# Patient Record
Sex: Male | Born: 1970
Health system: Southern US, Community
[De-identification: ages and names within clinical notes are randomized; demographics above are authoritative.]

## PROBLEM LIST (undated history)

## (undated) DIAGNOSIS — J309 Allergic rhinitis, unspecified: Secondary | ICD-10-CM

## (undated) DIAGNOSIS — E119 Type 2 diabetes mellitus without complications: Secondary | ICD-10-CM

## (undated) DIAGNOSIS — E785 Hyperlipidemia, unspecified: Secondary | ICD-10-CM

## (undated) DIAGNOSIS — J45909 Unspecified asthma, uncomplicated: Secondary | ICD-10-CM

## (undated) DIAGNOSIS — G4730 Sleep apnea, unspecified: Secondary | ICD-10-CM

## (undated) DIAGNOSIS — K760 Fatty (change of) liver, not elsewhere classified: Secondary | ICD-10-CM

## (undated) DIAGNOSIS — E669 Obesity, unspecified: Secondary | ICD-10-CM

## (undated) DIAGNOSIS — T7840XA Allergy, unspecified, initial encounter: Secondary | ICD-10-CM

## (undated) DIAGNOSIS — K5900 Constipation, unspecified: Secondary | ICD-10-CM

## (undated) DIAGNOSIS — K589 Irritable bowel syndrome without diarrhea: Secondary | ICD-10-CM

## (undated) DIAGNOSIS — J302 Other seasonal allergic rhinitis: Secondary | ICD-10-CM

## (undated) DIAGNOSIS — K3184 Gastroparesis: Secondary | ICD-10-CM

## (undated) DIAGNOSIS — G4733 Obstructive sleep apnea (adult) (pediatric): Secondary | ICD-10-CM

## (undated) DIAGNOSIS — K219 Gastro-esophageal reflux disease without esophagitis: Secondary | ICD-10-CM

## (undated) HISTORY — DX: Allergic rhinitis, unspecified: J30.9

## (undated) HISTORY — DX: Type 2 diabetes mellitus without complications: E11.9

## (undated) HISTORY — DX: Obesity, unspecified: E66.9

## (undated) HISTORY — PX: UPPER GASTROINTESTINAL ENDOSCOPY: SHX188

## (undated) HISTORY — DX: Hyperlipidemia, unspecified: E78.5

## (undated) HISTORY — DX: Sleep apnea, unspecified: G47.30

## (undated) HISTORY — DX: Gastro-esophageal reflux disease without esophagitis: K21.9

## (undated) HISTORY — PX: COLONOSCOPY: SHX174

## (undated) HISTORY — DX: Other seasonal allergic rhinitis: J30.2

## (undated) HISTORY — DX: Allergy, unspecified, initial encounter: T78.40XA

## (undated) HISTORY — DX: Obstructive sleep apnea (adult) (pediatric): G47.33

## (undated) HISTORY — DX: Constipation, unspecified: K59.00

## (undated) HISTORY — DX: Irritable bowel syndrome without diarrhea: K58.9

## (undated) HISTORY — DX: Gastroparesis: K31.84

## (undated) HISTORY — DX: Unspecified asthma, uncomplicated: J45.909

---

## 2012-07-05 ENCOUNTER — Encounter: Payer: Self-pay | Admitting: Internal Medicine

## 2012-07-05 ENCOUNTER — Ambulatory Visit (INDEPENDENT_AMBULATORY_CARE_PROVIDER_SITE_OTHER): Payer: BC Managed Care – PPO | Admitting: Internal Medicine

## 2012-07-05 VITALS — BP 110/78 | HR 84 | Temp 98.0°F | Ht 72.0 in | Wt 247.0 lb

## 2012-07-05 DIAGNOSIS — J309 Allergic rhinitis, unspecified: Secondary | ICD-10-CM

## 2012-07-05 DIAGNOSIS — E785 Hyperlipidemia, unspecified: Secondary | ICD-10-CM | POA: Insufficient documentation

## 2012-07-05 DIAGNOSIS — K219 Gastro-esophageal reflux disease without esophagitis: Secondary | ICD-10-CM | POA: Insufficient documentation

## 2012-07-05 HISTORY — DX: Gastro-esophageal reflux disease without esophagitis: K21.9

## 2012-07-05 HISTORY — DX: Allergic rhinitis, unspecified: J30.9

## 2012-07-05 MED ORDER — ESOMEPRAZOLE MAGNESIUM 40 MG PO CPDR: 40.0000 mg | DELAYED_RELEASE_CAPSULE | Freq: Every day | ORAL | Status: DC

## 2012-07-05 NOTE — Patient Instructions (Addendum)
Please sign a release of information and get records from: --GI, Dr. Eliberto Ivory -- your  previous doctor in [facility] --- Next visit in 4- 6 months for a complete physical, fasting

## 2012-07-05 NOTE — Assessment & Plan Note (Signed)
on Nasonex, symptoms not 100% control, recommend to take Claritin over-the-counter as needed.

## 2012-07-05 NOTE — Progress Notes (Signed)
  Subjective:    Patient ID: Wayne Melton, male    DOB: 09-13-1971, 41 y.o.   MRN: [id number]  HPI New patient Moved to [place] area from [place] few months ago. History of GERD, see assessment and plan, symptoms well-controlled History of high cholesterol, see assessment and plan Allergies, on Nasonex, occasionally takes antihistaminics. Currently symptoms are not as well-controlled as he would like them to be.  Past medical history Hyperlipidemia GERD, HH Peptic ulcer disease Allergies  Past surgical history None  Social history Married, 3 children Occupation-- IT, bussiness tobacco-- quit ! 2011, smoked x 4 years  ETOH-- rarely   Family history Diabetes-- M at age mid 89s CAD-- GF Stroke-- GF Colon cancer-- GF early 40s Prostate cancer-- GF early 90s   Review of Systems Reports sporadic nausea and vomiting for many years, no blood in the stools. No fever or chills No cough.    Objective:   Physical Exam General -- alert, well-developed. No apparent distress.  HEENT -- not pale Lungs -- normal respiratory effort, no intercostal retractions, no accessory muscle use, and normal breath sounds.   Heart-- normal rate, regular rhythm, no murmur, and no gallop.   Abdomen--soft, non-tender, no distention, no masses, no HSM, no guarding, and no rigidity.   Extremities-- no pretibial edema bilaterally  Neurologic-- alert & oriented X3 and strength normal in all extremities. Psych-- Cognition and judgment appear intact. Alert and cooperative with normal attention span and concentration.  not anxious appearing and not depressed appearing.      Assessment & Plan:

## 2012-07-05 NOTE — Assessment & Plan Note (Addendum)
Reports a history of high cholesterol, recommend to get records. At some point he took Niaspan and Lipitor, had side effects from Niaspan. Info about diet provided

## 2012-07-05 NOTE — Assessment & Plan Note (Signed)
History of GERD, a peptic ulcer and a hiatal hernia. Symptoms currently well controlled with Nexium In the past he had to endoscopy, the last one was approximately 2 years ago, Dr. Eliberto Ivory in [place]. Plan: Refill Nexium Get GI records

## 2012-09-20 ENCOUNTER — Telehealth: Payer: Self-pay | Admitting: Internal Medicine

## 2012-09-20 NOTE — Telephone Encounter (Signed)
Patient Information:  Caller Name: [place]  Phone: [phone]  Patient: Wayne Melton, Wayne Melton  Gender: Male  DOB: 11/23/1970  Age: 41 Years  PCP: Willow Ora  Office Follow Up:  Does the office need to follow up with this patient?: No  Instructions For The Office: N/A  RN Note:  Needs a Tetanus update.   No appts.  avaiable until Friday.  Going to UC this afternoon.  Symptoms  Reason For Call & Symptoms: Has a sliver of a piece of glass in his left foot.  Reviewed Health History In EMR: Yes  Reviewed Medications In EMR: Yes  Reviewed Allergies In EMR: Yes  Reviewed Surgeries / Procedures: Yes  Date of Onset of Symptoms: 09/06/2012  Treatments Tried: soaked the foot  Treatments Tried Worked: No  Guideline(s) Used:  Leg Injury  Disposition Per Guideline:   See Today or Tomorrow in Office  Reason For Disposition Reached:   Wound and no tetanus booster in > 5 years (Or greater than 10 years for clean cuts)  Advice Given:  N/A

## 2012-09-20 NOTE — Telephone Encounter (Signed)
Going to a urgent care

## 2012-11-06 ENCOUNTER — Ambulatory Visit (INDEPENDENT_AMBULATORY_CARE_PROVIDER_SITE_OTHER): Payer: BC Managed Care – PPO | Admitting: Internal Medicine

## 2012-11-06 ENCOUNTER — Encounter: Payer: Self-pay | Admitting: Internal Medicine

## 2012-11-06 VITALS — BP 108/82 | HR 77 | Temp 98.2°F | Ht 72.5 in | Wt 249.0 lb

## 2012-11-06 DIAGNOSIS — K589 Irritable bowel syndrome without diarrhea: Secondary | ICD-10-CM

## 2012-11-06 DIAGNOSIS — R0683 Snoring: Secondary | ICD-10-CM

## 2012-11-06 DIAGNOSIS — Z0000 Encounter for general adult medical examination without abnormal findings: Secondary | ICD-10-CM

## 2012-11-06 HISTORY — DX: Irritable bowel syndrome, unspecified: K58.9

## 2012-11-06 LAB — CBC WITH DIFFERENTIAL/PLATELET
Basophils Absolute: 0 10*3/uL (ref 0.0–0.1)
Eosinophils Absolute: 0 10*3/uL (ref 0.0–0.7)
HCT: 44.5 % (ref 39.0–52.0)
Hemoglobin: 15.3 g/dL (ref 13.0–17.0)
Lymphocytes Relative: 27.3 % (ref 12.0–46.0)
Lymphs Abs: 2.2 10*3/uL (ref 0.7–4.0)
MCHC: 34.3 g/dL (ref 30.0–36.0)
MCV: 89.3 fl (ref 78.0–100.0)
Monocytes Absolute: 0.5 10*3/uL (ref 0.1–1.0)
Neutro Abs: 5.2 10*3/uL (ref 1.4–7.7)
RDW: 13.6 % (ref 11.5–14.6)

## 2012-11-06 LAB — LIPID PANEL
Cholesterol: 307 mg/dL — ABNORMAL HIGH (ref 0–200)
Total CHOL/HDL Ratio: 10
Triglycerides: 512 mg/dL — ABNORMAL HIGH (ref 0.0–149.0)

## 2012-11-06 LAB — COMPREHENSIVE METABOLIC PANEL
Albumin: 4.1 g/dL (ref 3.5–5.2)
Alkaline Phosphatase: 101 U/L (ref 39–117)
BUN: 12 mg/dL (ref 6–23)
GFR: 85.29 mL/min (ref >60.00)
Glucose, Bld: 85 mg/dL (ref 70–99)
Potassium: 3.7 mEq/L (ref 3.5–5.1)

## 2012-11-06 MED ORDER — MOMETASONE FUROATE 50 MCG/ACT NA SUSP: 2.0000 | Freq: Every day | NASAL | Status: DC

## 2012-11-06 MED ORDER — ESOMEPRAZOLE MAGNESIUM 40 MG PO CPDR: 40.0000 mg | DELAYED_RELEASE_CAPSULE | Freq: Every day | ORAL | Status: DC

## 2012-11-06 NOTE — Progress Notes (Signed)
  Subjective:    Patient ID: Wayne Melton, male    DOB: Jul 09, 1971, 42 y.o.   MRN: [id number]  HPI Complete physical exam, his main concern today is fatigue, sleep apnea?. See review of systems.  Past Medical History  Diagnosis Date  . Allergic rhinitis 07/05/2012  . GERD , HH, h/o PUD 07/05/2012  . Hyperlipidemia    Past Surgical History  Procedure Date  . No past surgeries     Social history Married, 3 children Occupation-- IT, bussiness tobacco-- quit ! 2011, smoked x 4 years   ETOH-- rarely   Diet-- Exercise--  Family history Diabetes-- M at age mid 33s CAD-- GF Stroke-- GF Colon polyps-- M early 72s Colon cancer-- GF early 31s, uncle mid 67s Prostate cancer-- GF early 69s  Review of Systems Has a long history of very loud snoring, he also reports feeling extremely fatigue in the afternoon, as well as falling sleep very easy. No serious incidents such as falling asleep driving or at work. Denies chest pain or shortness of breath. History of IBS in the past, frequent constipation and   occasional diarrhea. No nausea, vomiting or blood in the stools. GERD symptoms well controlled. Admits to a lot of stress  however no clinical anxiety or depression.     Objective:   Physical Exam General -- alert, well-developed, and well-nourished.  BMI 33. Neck --no thyromegaly  HEENT --  throat is slightly crowded, neck anatomically  short Lungs -- normal respiratory effort, no intercostal retractions, no accessory muscle use, and normal breath sounds.   Heart-- normal rate, regular rhythm, no murmur, and no gallop.   Abdomen--soft, non-tender, no distention, no masses, no HSM, no guarding, and no rigidity.   Extremities-- no pretibial edema bilaterally  Neurologic-- alert & oriented X3 and strength normal in all extremities. Psych-- Cognition and judgment appear intact. Alert and cooperative with normal attention span and concentration.  not anxious appearing and not  depressed appearing.       Assessment & Plan:

## 2012-11-06 NOTE — Assessment & Plan Note (Addendum)
Td ~ 2006 per pt  Had a flu shot Has a family history of colon polyps (mother, early 73s ) and colon cancer ( uncle, mid 17s) Never had a colonoscopy, history of IBS. Plan: Refer to GI next year for a colonoscopy. In the past he saw Dr. Eliberto Ivory at [facility] gastroenterology Diet and exercise discussed Labs STE Clinically, I think there is enough evidence of a sleep apnea, we will schedule apnea lynk. Cardiovascular  Increased risk with sleep apnea discussed Addendum. 100s of pages of old records were reviewed.  Tdap 11/18/2008. History of multiple URIs and sinusitis, pneumonia diagnosed in 2012. Dyslipidemia, triglycerides in 2010 where as high as 754, his cholesterol in 2000 and was as high as 300. He was prescribed Lipitor which helped. Relevant records will be scanned, rest will go back to the patient for safe keeping.

## 2012-11-13 ENCOUNTER — Telehealth: Payer: Self-pay | Admitting: *Deleted

## 2012-11-13 DIAGNOSIS — R0683 Snoring: Secondary | ICD-10-CM

## 2012-11-13 MED ORDER — FENOFIBRATE 160 MG PO TABS: 160.0000 mg | ORAL_TABLET | Freq: Every day | ORAL | Status: DC

## 2012-11-13 NOTE — Telephone Encounter (Signed)
Called pt & discussed lab results. After discussing results pt asked about a referral for a sleep study. OK to enter referral? Please advise.

## 2012-11-13 NOTE — Telephone Encounter (Signed)
Yes , my plan was to set up an apnea lynk.  Order entered, let pt know

## 2012-11-13 NOTE — Addendum Note (Signed)
Addended by: Edwena Felty T on: 11/13/2012 01:53 PM   Modules accepted: Orders

## 2012-11-13 NOTE — Telephone Encounter (Signed)
Left detailed msg on pt's vmail.  

## 2012-11-30 ENCOUNTER — Telehealth: Payer: Self-pay | Admitting: *Deleted

## 2012-11-30 NOTE — Telephone Encounter (Addendum)
Pt left VM that he was calling to check status of sleep study.Please advise   Left message to call office

## 2012-12-01 NOTE — Telephone Encounter (Signed)
Have the results of the pt's sleep study test come back yet?

## 2012-12-01 NOTE — Telephone Encounter (Signed)
Thank you :)

## 2012-12-01 NOTE — Telephone Encounter (Signed)
has not scheduled sleep study yet per pt this morning when he called. I transferred him to [facility] at Pulmonary

## 2012-12-03 DIAGNOSIS — G4733 Obstructive sleep apnea (adult) (pediatric): Secondary | ICD-10-CM

## 2012-12-04 ENCOUNTER — Encounter: Payer: Self-pay | Admitting: Internal Medicine

## 2012-12-11 ENCOUNTER — Encounter: Payer: Self-pay | Admitting: Internal Medicine

## 2012-12-13 ENCOUNTER — Telehealth: Payer: Self-pay | Admitting: Internal Medicine

## 2012-12-13 NOTE — Telephone Encounter (Signed)
Advise patient, screening for sleep apnea was negative. I recommend to work on his weight to decrease snoring, if fatigue persists, will consider a more detailed exam like a overnight sleep study in the hospital. Also, be sure he has an appointment in April to followup his cholesterol.

## 2012-12-13 NOTE — Telephone Encounter (Signed)
Left message to call office

## 2012-12-18 NOTE — Telephone Encounter (Signed)
Left message to call office

## 2012-12-19 ENCOUNTER — Encounter: Payer: Self-pay | Admitting: *Deleted

## 2012-12-19 NOTE — Telephone Encounter (Signed)
Please call patient back at [phone]. Patient's wife said it is okay to call her but that you will need to call his # to reach him.

## 2012-12-19 NOTE — Telephone Encounter (Signed)
lmovm for pt to return call, mailed letter.

## 2012-12-20 NOTE — Telephone Encounter (Signed)
Discussed with pt, scheduled 2 month follow-up 4.14.14.

## 2013-01-15 ENCOUNTER — Encounter: Payer: Self-pay | Admitting: Internal Medicine

## 2013-01-15 ENCOUNTER — Ambulatory Visit (INDEPENDENT_AMBULATORY_CARE_PROVIDER_SITE_OTHER): Payer: BC Managed Care – PPO | Admitting: Internal Medicine

## 2013-01-15 VITALS — BP 110/78 | HR 77 | Temp 98.2°F | Wt 252.0 lb

## 2013-01-15 DIAGNOSIS — E785 Hyperlipidemia, unspecified: Secondary | ICD-10-CM

## 2013-01-15 DIAGNOSIS — J309 Allergic rhinitis, unspecified: Secondary | ICD-10-CM

## 2013-01-15 DIAGNOSIS — Z0000 Encounter for general adult medical examination without abnormal findings: Secondary | ICD-10-CM

## 2013-01-15 LAB — ALT: ALT: 34 U/L (ref 0–53)

## 2013-01-15 LAB — LIPID PANEL
Cholesterol: 249 mg/dL — ABNORMAL HIGH (ref 0–200)
Triglycerides: 215 mg/dL — ABNORMAL HIGH (ref 0.0–149.0)

## 2013-01-15 LAB — AST: AST: 15 U/L (ref 0–37)

## 2013-01-15 MED ORDER — NEDOCROMIL SODIUM 2 % OP SOLN: 2.0000 [drp] | Freq: Two times a day (BID) | OPHTHALMIC | Status: DC | PRN

## 2013-01-15 NOTE — Progress Notes (Signed)
  Subjective:    Patient ID: Wayne Melton, male    DOB: 02/06/1971, 42 y.o.   MRN: [id number]  HPI Follow up Dyslipidemia-- on fenofibrate, no s/e Allergies, sx increased lately, on flonase, still has eye sx   Past Medical History  Diagnosis Date  . Allergic rhinitis 07/05/2012  . GERD , HH, h/o PUD 07/05/2012  . Hyperlipidemia   . IBS (irritable bowel syndrome) 11/06/2012   Past Surgical History  Procedure Laterality Date  . No past surgeries       Review of Systems No N-V-D- abdominal pain Diet-exercise: about the same     Objective:   Physical Exam General -- alert, well-developed, No distress  HEENT -- TMs normal, throat w/o redness, face symmetric and not tender to palpation, Nose with mild congestion Neurologic-- alert & oriented X3 and strength normal in all extremities. Psych-- Cognition and judgment appear intact. Alert and cooperative with normal attention span and concentration.  not anxious appearing and not depressed appearing.       Assessment & Plan:

## 2013-01-15 NOTE — Assessment & Plan Note (Addendum)
See previous entry, home sleep study was negative. Results discussed with the patient.  He snores and has mild fatigue, plan is to work on diet, exercise and if ? of sleep apnea continue, will refer to pulmonary for  consideration of a overnight study.

## 2013-01-15 NOTE — Assessment & Plan Note (Addendum)
Nasal allergies well controlled with Nasonex, he still has eye symptoms. Recommend Alocril. Also OTC Claritin as needed

## 2013-01-15 NOTE — Assessment & Plan Note (Addendum)
Previous results discussed with patient, started fenofibrate 2 months ago, no side effects. Encourage a healthy diet and more exercise. Will check labs

## 2013-01-16 ENCOUNTER — Encounter: Payer: Self-pay | Admitting: Internal Medicine

## 2013-01-16 ENCOUNTER — Encounter: Payer: Self-pay | Admitting: *Deleted

## 2013-01-22 ENCOUNTER — Telehealth: Payer: Self-pay | Admitting: *Deleted

## 2013-01-22 ENCOUNTER — Encounter: Payer: Self-pay | Admitting: *Deleted

## 2013-01-22 MED ORDER — AZELASTINE HCL 0.05 % OP SOLN: 1.0000 [drp] | Freq: Two times a day (BID) | OPHTHALMIC | Status: DC

## 2013-01-22 NOTE — Telephone Encounter (Signed)
Refill done.  

## 2013-04-30 ENCOUNTER — Other Ambulatory Visit: Payer: Self-pay | Admitting: Internal Medicine

## 2013-04-30 ENCOUNTER — Encounter: Payer: Self-pay | Admitting: Internal Medicine

## 2013-04-30 ENCOUNTER — Ambulatory Visit (INDEPENDENT_AMBULATORY_CARE_PROVIDER_SITE_OTHER): Payer: BC Managed Care – PPO | Admitting: Internal Medicine

## 2013-04-30 VITALS — BP 120/82 | HR 63 | Temp 98.1°F | Wt 255.2 lb

## 2013-04-30 DIAGNOSIS — J309 Allergic rhinitis, unspecified: Secondary | ICD-10-CM

## 2013-04-30 MED ORDER — AMOXICILLIN 500 MG PO CAPS: 1000.0000 mg | ORAL_CAPSULE | Freq: Two times a day (BID) | ORAL | Status: DC

## 2013-04-30 MED ORDER — BECLOMETHASONE DIPROPIONATE 80 MCG/ACT NA AERS: 2.0000 | INHALATION_SPRAY | Freq: Every day | NASAL | Status: DC

## 2013-04-30 NOTE — Assessment & Plan Note (Signed)
Presents with upper respiratory symptoms for few weeks, allergies versus low-grade sinusitis?. Will get him back on nasal steroids, he felt like nasonex  wasn't working as well for him, thus will try qnsal and add zyrtec See instructions

## 2013-04-30 NOTE — Patient Instructions (Addendum)
Rest, fluids   take Mucinex twice a day for nasal congestion Qnsal daily Zyrtec daily (OTC)  Take the antibiotic as prescribed  (Amoxicillin) if no better in 1 week Call anytime if the symptoms are severe,or not back to normal in 2 - 3 weeks

## 2013-04-30 NOTE — Telephone Encounter (Signed)
Refill done per protocol.  

## 2013-04-30 NOTE — Progress Notes (Signed)
  Subjective:    Patient ID: Wayne Melton, male    DOB: 1971/08/15, 42 y.o.   MRN: [id number]  HPI Acute visit 4 weeks history of sinus congestion, postnasal dripping; Also for 2 or 3 weeks h/o  ear pressure. He discontinue the nasal spray about 3 weeks ago because he felt the allergy season was over.  Past Medical History  Diagnosis Date  . Allergic rhinitis 07/05/2012  . GERD , HH, h/o PUD 07/05/2012  . Hyperlipidemia   . IBS (irritable bowel syndrome) 11/06/2012   Past Surgical History  Procedure Laterality Date  . No past surgeries       Review of Systems No fever chills. No cough Not itchy eyes and nose but some sneezing. Mild muscles aches for 4 weeks and also some sweats at night     Objective:   Physical Exam General -- alert, well-developed, NAD   HEENT -- TMs  Bulge but no red R>L, no d/c; throat w/o redness, face symmetric and not tender to palpation at maxilary sinuses. Nose w/ mild congestion Lungs -- normal respiratory effort, no intercostal retractions, no accessory muscle use, and normal breath sounds.   Heart-- normal rate, regular rhythm, no murmur, and no gallop.    Psych-- Cognition and judgment appear intact. Alert and cooperative with normal attention span and concentration.  not anxious appearing and not depressed appearing.       Assessment & Plan:

## 2013-05-07 ENCOUNTER — Ambulatory Visit: Payer: BC Managed Care – PPO | Admitting: Internal Medicine

## 2013-05-07 DIAGNOSIS — Z0289 Encounter for other administrative examinations: Secondary | ICD-10-CM

## 2013-12-04 ENCOUNTER — Other Ambulatory Visit: Payer: Self-pay | Admitting: Internal Medicine

## 2013-12-07 ENCOUNTER — Encounter: Payer: Self-pay | Admitting: Physician Assistant

## 2013-12-07 ENCOUNTER — Ambulatory Visit (INDEPENDENT_AMBULATORY_CARE_PROVIDER_SITE_OTHER): Payer: BC Managed Care – PPO | Admitting: Physician Assistant

## 2013-12-07 VITALS — BP 117/82 | HR 89 | Temp 98.4°F | Wt 266.0 lb

## 2013-12-07 DIAGNOSIS — J209 Acute bronchitis, unspecified: Secondary | ICD-10-CM | POA: Insufficient documentation

## 2013-12-07 MED ORDER — AZITHROMYCIN 250 MG PO TABS: ORAL_TABLET | ORAL | Status: DC

## 2013-12-07 NOTE — Patient Instructions (Signed)
Take antibiotic as prescribed-- Azithromycin.  Increase fluid intake.  Rest.  Saline nasal spray. Use Mucinex-D as needed. Tylenol if fever returns. Place a Humidifier in bedroom. Continue allergy medications.  Please call or return to clinic if symptoms are not improving.  Bronchitis Bronchitis is inflammation of the airways that extend from the windpipe into the lungs (bronchi). The inflammation often causes mucus to develop, which leads to a cough. If the inflammation becomes severe, it may cause shortness of breath. CAUSES  Bronchitis may be caused by:   Viral infections.   Bacteria.   Cigarette smoke.   Allergens, pollutants, and other irritants.  SIGNS AND SYMPTOMS  The most common symptom of bronchitis is a frequent cough that produces mucus. Other symptoms include:  Fever.   Body aches.   Chest congestion.   Chills.   Shortness of breath.   Sore throat.  DIAGNOSIS  Bronchitis is usually diagnosed through a medical history and physical exam. Tests, such as chest X-rays, are sometimes done to rule out other conditions.  TREATMENT  You may need to avoid contact with whatever caused the problem (smoking, for example). Medicines are sometimes needed. These may include:  Antibiotics. These may be prescribed if the condition is caused by bacteria.  Cough suppressants. These may be prescribed for relief of cough symptoms.   Inhaled medicines. These may be prescribed to help open your airways and make it easier for you to breathe.   Steroid medicines. These may be prescribed for those with recurrent (chronic) bronchitis. HOME CARE INSTRUCTIONS  Get plenty of rest.   Drink enough fluids to keep your urine clear or pale yellow (unless you have a medical condition that requires fluid restriction). Increasing fluids may help thin your secretions and will prevent dehydration.   Only take over-the-counter or prescription medicines as directed by your health care  provider.  Only take antibiotics as directed. Make sure you finish them even if you start to feel better.  Avoid secondhand smoke, irritating chemicals, and strong fumes. These will make bronchitis worse. If you are a smoker, quit smoking. Consider using nicotine gum or skin patches to help control withdrawal symptoms. Quitting smoking will help your lungs heal faster.   Put a cool-mist humidifier in your bedroom at night to moisten the air. This may help loosen mucus. Change the water in the humidifier daily. You can also run the hot water in your shower and sit in the bathroom with the door closed for 5 10 minutes.   Follow up with your health care provider as directed.   Wash your hands frequently to avoid catching bronchitis again or spreading an infection to others.  SEEK MEDICAL CARE IF: Your symptoms do not improve after 1 week of treatment.  SEEK IMMEDIATE MEDICAL CARE IF:  Your fever increases.  You have chills.   You have chest pain.   You have worsening shortness of breath.   You have bloody sputum.  You faint.  You have lightheadedness.  You have a severe headache.   You vomit repeatedly. MAKE SURE YOU:   Understand these instructions.  Will watch your condition.  Will get help right away if you are not doing well or get worse. Document Released: 09/20/2005 Document Revised: 07/11/2013 Document Reviewed: 05/15/2013 [facility] Patient Information 2014 [place].

## 2013-12-07 NOTE — Progress Notes (Signed)
Patient presents to clinic today c/o 1.5 weeks of productive cough, chest congestion and fever.  Patient denies history of asthma.  Does have significant allergies for which he takes prescription medication.  Denies recent travel. Endorses chest tenderness 2/2 coughing.  Denies pleuritic chest pain.  Denies excess SOB.  Had some wheezing initially that has resolved.  Patient has taken Mucinex with some relief of symptoms.  Denies sinus pressure or pain currently.   Past Medical History  Diagnosis Date  . Allergic rhinitis 07/05/2012  . GERD , HH, h/o PUD 07/05/2012  . Hyperlipidemia   . IBS (irritable bowel syndrome) 11/06/2012   Current Outpatient Prescriptions on File Prior to Visit  Medication Sig Dispense Refill  . azelastine (OPTIVAR) 0.05 % ophthalmic solution Place 1 drop into both eyes 2 (two) times daily.  6 mL  6  . Beclomethasone Dipropionate (QNASL) 80 MCG/ACT AERS Place 2 sprays into the nose daily.  1 Inhaler  6  . fenofibrate 160 MG tablet TAKE 1 TABLET BY MOUTH EVERY DAY  30 tablet  5  . NEXIUM 40 MG capsule TAKE 1 CAPSULE BEFORE BREAKFAST EVERY MORNING  90 capsule  1   No current facility-administered medications on file prior to visit.    No Known Allergies  No family history on file.  History   Social History  . Marital Status: Married    Spouse Name: N/A    Number of Children: N/A  . Years of Education: N/A   Social History Main Topics  . Smoking status: Not on file  . Smokeless tobacco: Not on file  . Alcohol Use: Not on file  . Drug Use: Not on file  . Sexual Activity: Not on file   Other Topics Concern  . Not on file   Social History Narrative  . No narrative on file   Review of Systems - See HPI.  All other ROS are negative.  BP 117/82  Pulse 89  Temp(Src) 98.4 F (36.9 C) (Oral)  Wt 266 lb (120.657 kg)  SpO2 98%  Physical Exam  Vitals reviewed. Constitutional: He is oriented to person, place, and time and well-developed, well-nourished, and  in no distress.  HENT:  Head: Normocephalic and atraumatic.  Right Ear: External ear normal.  Left Ear: External ear normal.  Nose: Nose normal.  Mouth/Throat: Oropharynx is clear and moist. No oropharyngeal exudate.  TM within normal limits bilaterally.  No TTP of sinuses.  Eyes: Conjunctivae are normal. Pupils are equal, round, and reactive to light.  Neck: Neck supple.  Cardiovascular: Normal rate, regular rhythm, normal heart sounds and intact distal pulses.   Pulmonary/Chest: Effort normal and breath sounds normal. No respiratory distress. He has no wheezes. He has no rales. He exhibits no tenderness.  Lymphadenopathy:    He has no cervical adenopathy.  Neurological: He is alert and oriented to person, place, and time.  Skin: Skin is warm and dry. No rash noted.  Psychiatric: Affect normal.    Assessment/Plan: Acute bronchitis Rx Azithromcyin. Increase fluid intake.  Rest.  Saline nasal spray. Mucinex. Humidifier in bedroom. Continue allergy medications as prescribed.  Call or return to clinic if symptoms are not improving.

## 2013-12-07 NOTE — Assessment & Plan Note (Signed)
Rx Azithromcyin. Increase fluid intake.  Rest.  Saline nasal spray. Mucinex. Humidifier in bedroom. Continue allergy medications as prescribed.  Call or return to clinic if symptoms are not improving.

## 2013-12-07 NOTE — Progress Notes (Signed)
Pre-visit discussion using our clinic review tool. No additional management support is needed unless otherwise documented below in the visit note.  

## 2013-12-10 ENCOUNTER — Telehealth: Payer: Self-pay | Admitting: *Deleted

## 2013-12-10 DIAGNOSIS — K219 Gastro-esophageal reflux disease without esophagitis: Secondary | ICD-10-CM

## 2013-12-10 MED ORDER — ESOMEPRAZOLE MAGNESIUM 40 MG PO PACK: 40.0000 mg | PACK | Freq: Every day | ORAL | Status: DC

## 2013-12-10 NOTE — Telephone Encounter (Signed)
rx refilled per protocol  

## 2014-01-03 ENCOUNTER — Other Ambulatory Visit: Payer: Self-pay | Admitting: Internal Medicine

## 2014-02-12 ENCOUNTER — Other Ambulatory Visit: Payer: Self-pay | Admitting: Internal Medicine

## 2014-11-02 ENCOUNTER — Encounter (HOSPITAL_COMMUNITY): Payer: Self-pay | Admitting: Emergency Medicine

## 2014-11-02 ENCOUNTER — Emergency Department (HOSPITAL_COMMUNITY)
Admission: EM | Admit: 2014-11-02 | Discharge: 2014-11-03 | Disposition: A | Discharge status: Home / Self Care | Payer: BLUE CROSS/BLUE SHIELD | Attending: Emergency Medicine | Admitting: Emergency Medicine

## 2014-11-02 DIAGNOSIS — Z8711 Personal history of peptic ulcer disease: Secondary | ICD-10-CM | POA: Diagnosis not present

## 2014-11-02 DIAGNOSIS — Z79899 Other long term (current) drug therapy: Secondary | ICD-10-CM | POA: Insufficient documentation

## 2014-11-02 DIAGNOSIS — K219 Gastro-esophageal reflux disease without esophagitis: Secondary | ICD-10-CM | POA: Diagnosis not present

## 2014-11-02 DIAGNOSIS — R079 Chest pain, unspecified: Secondary | ICD-10-CM

## 2014-11-02 DIAGNOSIS — Z8709 Personal history of other diseases of the respiratory system: Secondary | ICD-10-CM | POA: Insufficient documentation

## 2014-11-02 DIAGNOSIS — F419 Anxiety disorder, unspecified: Secondary | ICD-10-CM | POA: Insufficient documentation

## 2014-11-02 DIAGNOSIS — E785 Hyperlipidemia, unspecified: Secondary | ICD-10-CM | POA: Insufficient documentation

## 2014-11-02 NOTE — ED Provider Notes (Signed)
CSN: [id number]     Arrival date & time 11/02/14  2337 History  This chart was scribed for Julianne Rice, MD by Molli Posey, ED Scribe. This patient was seen in room B14C/B14C and the patient's care was started 11:47 PM.   Chief Complaint  Patient presents with  . Chest Pain   The history is provided by the patient. No language interpreter was used.   HPI Comments: Wayne Melton is a 44 y.o. male with a history of GERD and hyperlipidemia who presents to the Emergency Department complaining of chest pain that started about an hour ago and woke him from his sleep. Pt states that the onset of his symptoms started with similar prior GERD flare ups and says he could barely talk and was gagging. He says his chest started to get tight, felt sightly disoriented and could feel his HR increase. Pt states that he was given nitroglycerin en route and states his symptoms improved en route. Pt reports that he has felt light headed and tired this week. Pt states that he has been taking his Nexium regularly. He reports he ate mozzarella sticks around 7PM. Pt reports a history of smoking 0.25 ppd for 3-4 years and says he quit 2 years ago. He reports no recent extended travel or surgeries.   Past Medical History  Diagnosis Date  . Allergic rhinitis 07/05/2012  . GERD , HH, h/o PUD 07/05/2012  . Hyperlipidemia   . IBS (irritable bowel syndrome) 11/06/2012   Past Surgical History  Procedure Laterality Date  . No past surgeries     No family history on file. History  Substance Use Topics  . Smoking status: Never Smoker   . Smokeless tobacco: Not on file  . Alcohol Use: No    Review of Systems  Constitutional: Negative for fever and chills.  Respiratory: Positive for cough, choking and shortness of breath. Negative for wheezing.   Cardiovascular: Positive for chest pain and palpitations. Negative for leg swelling.  Gastrointestinal: Negative for nausea, vomiting, abdominal pain, diarrhea and  constipation.  Musculoskeletal: Negative for back pain, neck pain and neck stiffness.  Skin: Negative for rash and wound.  Neurological: Positive for dizziness and light-headedness. Negative for weakness, numbness and headaches.  Psychiatric/Behavioral: The patient is nervous/anxious.   All other systems reviewed and are negative.   Allergies  Review of patient's allergies indicates no known allergies.  Home Medications   Prior to Admission medications   Medication Sig Start Date End Date Taking? Authorizing Provider  acetaminophen (TYLENOL) 500 MG tablet Take 1,000 mg by mouth every 6 (six) hours as needed for moderate pain.   Yes Historical Provider, MD  NEXIUM 40 MG capsule TAKE 1 CAPSULE BEFORE BREAKFAST EVERY MORNING   Yes Colon Branch, MD  azelastine (OPTIVAR) 0.05 % ophthalmic solution Place 1 drop into both eyes 2 (two) times daily. Patient not taking: Reported on 11/03/2014 01/22/13   Colon Branch, MD  azithromycin (ZITHROMAX) 250 MG tablet Take 2 tablets on Day 1.  Then take 1 tablet daily. Patient not taking: Reported on 11/03/2014 12/07/13   Brunetta Jeans, PA-C  Beclomethasone Dipropionate (QNASL) 80 MCG/ACT AERS Place 2 sprays into the nose daily. Patient not taking: Reported on 11/03/2014 04/30/13   Colon Branch, MD  fenofibrate 160 MG tablet TAKE 1 TABLET BY MOUTH EVERY DAY Patient not taking: Reported on 11/03/2014    Colon Branch, MD   BP 112/69 mmHg  Pulse 85  Temp(Src) 98.1 F (  36.7 C) (Oral)  Resp 12  SpO2 98% Physical Exam  Constitutional: He is oriented to person, place, and time. He appears well-developed and well-nourished. No distress.  Anxious appearing  HENT:  Head: Normocephalic and atraumatic.  Mouth/Throat: Oropharynx is clear and moist. No oropharyngeal exudate.  Eyes: EOM are normal. Pupils are equal, round, and reactive to light.  Neck: Normal range of motion. Neck supple.  Cardiovascular: Normal rate and regular rhythm.  Exam reveals no gallop and no  friction rub.   No murmur heard. Pulmonary/Chest: Effort normal and breath sounds normal. No respiratory distress. He has no wheezes. He has no rales. He exhibits no tenderness.  Abdominal: Soft. Bowel sounds are normal. He exhibits no distension and no mass. There is no tenderness. There is no rebound and no guarding.  Musculoskeletal: Normal range of motion. He exhibits no edema or tenderness.  No lower extremity swelling or pain. Distal pulses intact.  Neurological: He is alert and oriented to person, place, and time.  5/5 motor in all extremities. Sensation is fully intact.  Skin: Skin is warm and dry. No rash noted. No erythema.  Psychiatric: His behavior is normal.  Nursing note and vitals reviewed.   ED Course  Procedures   DIAGNOSTIC STUDIES: Oxygen Saturation is 100% on RA, normal by my interpretation.    COORDINATION OF CARE: 11:54 PM Discussed treatment plan with pt at bedside and pt agreed to plan.   Labs Review Labs Reviewed  COMPREHENSIVE METABOLIC PANEL - Abnormal; Notable for the following:    Glucose, Bld 207 (*)    All other components within normal limits  CBC WITH DIFFERENTIAL/PLATELET  TROPONIN I  D-DIMER, QUANTITATIVE  I-STAT TROPOININ, ED    Imaging Review Dg Chest 2 View  11/03/2014   CLINICAL DATA:  Chest pain. GERD. Pt states that he was given nitroglycerin en route and states it immediately provided relief to his symptoms. Pt reports that he has felt light headed and tired this week. HTN currently. Pt states that he has been taking his Nexium regularly. He reports he ate mozzarella sticks around 7PM.  EXAM: CHEST  2 VIEW  COMPARISON:  None.  FINDINGS: The heart size and mediastinal contours are within normal limits. Both lungs are clear. The visualized skeletal structures are unremarkable.  IMPRESSION: No active cardiopulmonary disease.   Electronically Signed   By: Kathreen Devoid   On: 11/03/2014 00:28     EKG Interpretation   Date/Time:  Saturday  November 02 2014 23:44:08 EST Ventricular Rate:  106 PR Interval:  140 QRS Duration: 113 QT Interval:  354 QTC Calculation: 470 R Axis:   71 Text Interpretation:  Sinus tachycardia Incomplete right bundle branch  block Confirmed by Rindi Beechy  MD, Evalynne Locurto ([id number]) on 11/02/2014 11:57:43 PM      MDM   Final diagnoses:  Chest pain   I personally performed the services described in this documentation, which was scribed in my presence. The recorded information has been reviewed and is accurate.  Patient is resting comfortably. No further chest pain. Troponin 2 is normal. EKG without any findings for ischemia. Suspect patient likely had reflux and then developed anxiety. Chest pain is atypical. Heart score is 0. We'll discharge home. Patient is to follow-up with his gastroenterologist. He's been given return precautions and is voiced understanding.     Julianne Rice, MD 11/03/14 [phone]

## 2014-11-02 NOTE — ED Notes (Signed)
Per EMS, pt from home. Pt awoken from sleep q 1 hour ago with 4/10 non radiating central CP and palpitations with mild SOB. Pt given 1 Nitro with almost instant relief and 324 ASA PTA. Pt states CP is now 1/10. NAD noted. ST on montior 113 bpm. 100% RA. RR 18 149/94 BP

## 2014-11-02 NOTE — ED Notes (Signed)
MD Yelverton at bedside. 

## 2014-11-03 ENCOUNTER — Emergency Department (HOSPITAL_COMMUNITY): Payer: BLUE CROSS/BLUE SHIELD

## 2014-11-03 LAB — CBC WITH DIFFERENTIAL/PLATELET
Basophils Absolute: 0 10*3/uL (ref 0.0–0.1)
Basophils Relative: 0 % (ref 0–1)
EOS ABS: 0.1 10*3/uL (ref 0.0–0.7)
Eosinophils Relative: 1 % (ref 0–5)
HEMATOCRIT: 42.1 % (ref 39.0–52.0)
Hemoglobin: 14.4 g/dL (ref 13.0–17.0)
Lymphocytes Relative: 20 % (ref 12–46)
Lymphs Abs: 1.8 10*3/uL (ref 0.7–4.0)
MCH: 29.6 pg (ref 26.0–34.0)
MCHC: 34.2 g/dL (ref 30.0–36.0)
MCV: 86.4 fL (ref 78.0–100.0)
MONOS PCT: 7 % (ref 3–12)
Monocytes Absolute: 0.6 10*3/uL (ref 0.1–1.0)
NEUTROS ABS: 6.3 10*3/uL (ref 1.7–7.7)
Neutrophils Relative %: 72 % (ref 43–77)
Platelets: 323 10*3/uL (ref 150–400)
RBC: 4.87 MIL/uL (ref 4.22–5.81)
RDW: 12.9 % (ref 11.5–15.5)
WBC: 8.7 10*3/uL (ref 4.0–10.5)

## 2014-11-03 LAB — COMPREHENSIVE METABOLIC PANEL
ALBUMIN: 3.6 g/dL (ref 3.5–5.2)
ALT: 35 U/L (ref 0–53)
AST: 15 U/L (ref 0–37)
Alkaline Phosphatase: 105 U/L (ref 39–117)
Anion gap: 9 (ref 5–15)
BUN: 9 mg/dL (ref 6–23)
CO2: 25 mmol/L (ref 19–32)
Calcium: 9 mg/dL (ref 8.4–10.5)
Chloride: 102 mmol/L (ref 96–112)
Creatinine, Ser: 0.98 mg/dL (ref 0.50–1.35)
GFR calc non Af Amer: >90 mL/min (ref >90)
Glucose, Bld: 207 mg/dL — ABNORMAL HIGH (ref 70–99)
Potassium: 3.6 mmol/L (ref 3.5–5.1)
SODIUM: 136 mmol/L (ref 135–145)
Total Bilirubin: 0.7 mg/dL (ref 0.3–1.2)
Total Protein: 6.6 g/dL (ref 6.0–8.3)

## 2014-11-03 LAB — TROPONIN I: Troponin I: <0.03 ng/mL (ref <0.031)

## 2014-11-03 LAB — I-STAT TROPONIN, ED: Troponin i, poc: 0 ng/mL (ref 0.00–0.08)

## 2014-11-03 LAB — D-DIMER, QUANTITATIVE (NOT AT ARMC): D DIMER QUANT: 0.4 ug{FEU}/mL (ref 0.00–0.48)

## 2014-11-03 NOTE — Discharge Instructions (Signed)
Gastroesophageal Reflux Disease, Adult  Gastroesophageal reflux disease (GERD) happens when acid from your stomach flows up into the esophagus. When acid comes in contact with the esophagus, the acid causes soreness (inflammation) in the esophagus. Over time, GERD may create small holes (ulcers) in the lining of the esophagus.  CAUSES    Increased body weight. This puts pressure on the stomach, making acid rise from the stomach into the esophagus.   Smoking. This increases acid production in the stomach.   Drinking alcohol. This causes decreased pressure in the lower esophageal sphincter (valve or ring of muscle between the esophagus and stomach), allowing acid from the stomach into the esophagus.   Late evening meals and a full stomach. This increases pressure and acid production in the stomach.   A malformed lower esophageal sphincter.  Sometimes, no cause is found.  SYMPTOMS    Burning pain in the lower part of the mid-chest behind the breastbone and in the mid-stomach area. This may occur twice a week or more often.   Trouble swallowing.   Sore throat.   Dry cough.   Asthma-like symptoms including chest tightness, shortness of breath, or wheezing.  DIAGNOSIS   Your caregiver may be able to diagnose GERD based on your symptoms. In some cases, X-rays and other tests may be done to check for complications or to check the condition of your stomach and esophagus.  TREATMENT   Your caregiver may recommend over-the-counter or prescription medicines to help decrease acid production. Ask your caregiver before starting or adding any new medicines.   HOME CARE INSTRUCTIONS    Change the factors that you can control. Ask your caregiver for guidance concerning weight loss, quitting smoking, and alcohol consumption.   Avoid foods and drinks that make your symptoms worse, such as:   Caffeine or alcoholic drinks.   Chocolate.   Peppermint or mint flavorings.   Garlic and onions.   Spicy foods.   Citrus fruits,  such as oranges, lemons, or limes.   Tomato-based foods such as sauce, chili, salsa, and pizza.   Fried and fatty foods.   Avoid lying down for the 3 hours prior to your bedtime or prior to taking a nap.   Eat small, frequent meals instead of large meals.   Wear loose-fitting clothing. Do not wear anything tight around your waist that causes pressure on your stomach.   Raise the head of your bed 6 to 8 inches with wood blocks to help you sleep. Extra pillows will not help.   Only take over-the-counter or prescription medicines for pain, discomfort, or fever as directed by your caregiver.   Do not take aspirin, ibuprofen, or other nonsteroidal anti-inflammatory drugs (NSAIDs).  SEEK IMMEDIATE MEDICAL CARE IF:    You have pain in your arms, neck, jaw, teeth, or back.   Your pain increases or changes in intensity or duration.   You develop nausea, vomiting, or sweating (diaphoresis).   You develop shortness of breath, or you faint.   Your vomit is green, yellow, black, or looks like coffee grounds or blood.   Your stool is red, bloody, or black.  These symptoms could be signs of other problems, such as heart disease, gastric bleeding, or esophageal bleeding.  MAKE SURE YOU:    Understand these instructions.   Will watch your condition.   Will get help right away if you are not doing well or get worse.  Document Released: 06/30/2005 Document Revised: 12/13/2011 Document Reviewed: 04/09/2011  Exit[employer] Patient   Information 2015 [employer]. This information is not intended to replace advice given to you by your health care provider. Make sure you discuss any questions you have with your health care provider.  Chest Pain (Nonspecific)  It is often hard to give a specific diagnosis for the cause of chest pain. There is always a chance that your pain could be related to something serious, such as a heart attack or a blood clot in the lungs. You need to follow up with your health care provider for further  evaluation.  CAUSES    Heartburn.   Pneumonia or bronchitis.   Anxiety or stress.   Inflammation around your heart (pericarditis) or lung (pleuritis or pleurisy).   A blood clot in the lung.   A collapsed lung (pneumothorax). It can develop suddenly on its own (spontaneous pneumothorax) or from trauma to the chest.   Shingles infection (herpes zoster virus).  The chest wall is composed of bones, muscles, and cartilage. Any of these can be the source of the pain.   The bones can be bruised by injury.   The muscles or cartilage can be strained by coughing or overwork.   The cartilage can be affected by inflammation and become sore (costochondritis).  DIAGNOSIS   Lab tests or other studies may be needed to find the cause of your pain. Your health care provider may have you take a test called an ambulatory electrocardiogram (ECG). An ECG records your heartbeat patterns over a 24-hour period. You may also have other tests, such as:   Transthoracic echocardiogram (TTE). During echocardiography, sound waves are used to evaluate how blood flows through your heart.   Transesophageal echocardiogram (TEE).   Cardiac monitoring. This allows your health care provider to monitor your heart rate and rhythm in real time.   Holter monitor. This is a portable device that records your heartbeat and can help diagnose heart arrhythmias. It allows your health care provider to track your heart activity for several days, if needed.   Stress tests by exercise or by giving medicine that makes the heart beat faster.  TREATMENT    Treatment depends on what may be causing your chest pain. Treatment may include:   Acid blockers for heartburn.   Anti-inflammatory medicine.   Pain medicine for inflammatory conditions.   Antibiotics if an infection is present.   You may be advised to change lifestyle habits. This includes stopping smoking and avoiding alcohol, caffeine, and chocolate.   You may be advised to keep your head  raised (elevated) when sleeping. This reduces the chance of acid going backward from your stomach into your esophagus.  Most of the time, nonspecific chest pain will improve within 2-3 days with rest and mild pain medicine.   HOME CARE INSTRUCTIONS    If antibiotics were prescribed, take them as directed. Finish them even if you start to feel better.   For the next few days, avoid physical activities that bring on chest pain. Continue physical activities as directed.   Do not use any tobacco products, including cigarettes, chewing tobacco, or electronic cigarettes.   Avoid drinking alcohol.   Only take medicine as directed by your health care provider.   Follow your health care provider's suggestions for further testing if your chest pain does not go away.   Keep any follow-up appointments you made. If you do not go to an appointment, you could develop lasting (chronic) problems with pain. If there is any problem keeping an appointment, call   to reschedule.  SEEK MEDICAL CARE IF:    Your chest pain does not go away, even after treatment.   You have a rash with blisters on your chest.   You have a fever.  SEEK IMMEDIATE MEDICAL CARE IF:    You have increased chest pain or pain that spreads to your arm, neck, jaw, back, or abdomen.   You have shortness of breath.   You have an increasing cough, or you cough up blood.   You have severe back or abdominal pain.   You feel nauseous or vomit.   You have severe weakness.   You faint.   You have chills.  This is an emergency. Do not wait to see if the pain will go away. Get medical help at once. Call your local emergency services (911 in [place] Do not drive yourself to the hospital.  MAKE SURE YOU:    Understand these instructions.   Will watch your condition.   Will get help right away if you are not doing well or get worse.  Document Released: 06/30/2005 Document Revised: 09/25/2013 Document Reviewed: 04/25/2008  [employer] Patient Information 2015  [employer]. This information is not intended to replace advice given to you by your health care provider. Make sure you discuss any questions you have with your health care provider.

## 2014-11-07 ENCOUNTER — Encounter: Payer: Self-pay | Admitting: Internal Medicine

## 2014-11-07 ENCOUNTER — Ambulatory Visit (INDEPENDENT_AMBULATORY_CARE_PROVIDER_SITE_OTHER): Payer: BLUE CROSS/BLUE SHIELD | Admitting: Internal Medicine

## 2014-11-07 VITALS — BP 121/85 | HR 80 | Temp 98.1°F | Ht 73.0 in | Wt 264.5 lb

## 2014-11-07 DIAGNOSIS — R5383 Other fatigue: Secondary | ICD-10-CM

## 2014-11-07 DIAGNOSIS — K219 Gastro-esophageal reflux disease without esophagitis: Secondary | ICD-10-CM

## 2014-11-07 DIAGNOSIS — E119 Type 2 diabetes mellitus without complications: Secondary | ICD-10-CM

## 2014-11-07 DIAGNOSIS — R739 Hyperglycemia, unspecified: Secondary | ICD-10-CM

## 2014-11-07 DIAGNOSIS — R079 Chest pain, unspecified: Secondary | ICD-10-CM

## 2014-11-07 DIAGNOSIS — E785 Hyperlipidemia, unspecified: Secondary | ICD-10-CM

## 2014-11-07 MED ORDER — PANTOPRAZOLE SODIUM 40 MG PO TBEC: 40.0000 mg | DELAYED_RELEASE_TABLET | Freq: Two times a day (BID) | ORAL | Status: DC

## 2014-11-07 NOTE — Patient Instructions (Signed)
Get your blood work before you leave     Please come back to the office in 2 months  for a physical exam. Come back fasting     If you need more information about a healthy diet  visit  the American [employer] Association, it  is a great resource online at:  http://www.richard-flynn.net/

## 2014-11-07 NOTE — Progress Notes (Signed)
Pre visit review using our clinic review tool, if applicable. No additional management support is needed unless otherwise documented below in the visit note. 

## 2014-11-07 NOTE — Progress Notes (Signed)
Subjective:    Patient ID: Wayne Melton, male    DOB: Aug 07, 1971, 44 y.o.   MRN: [id number]  DOS:  11/07/2014 Type of visit - description : ER f/u Interval history: The patient went to the ER 11/03/2014, 1 hour prior to the arrival he was wake up by chest pain, the pain was described as a burning sensation "like GERD but more severe ", and somewhat like  tightness in the chest. He did have some nausea. No radiation. At the ER, troponins, d-dimer, labs and chest x-ray were unremarkable except for a blood sugar of 200. EKG showing incomplete RBBB. Reports that that night he had two sodas and he thinks that is what causes sugar to be high. Since the ER visit he has been more careful with his diet and has no further chest pain.  He continue feeling fatigue, occasionally dizzy and snoring.  Review of Systems  No fever chills No shortness of breath, occasionally has lower extremity edema and end of the day No nausea, vomiting, diarrhea or blood in the stools. Occasionally he feels bloated. Denies dysphasia or odynophagia No cough or wheezing.  Past Medical History  Diagnosis Date  . Allergic rhinitis 07/05/2012  . GERD , HH, h/o PUD 07/05/2012  . Hyperlipidemia   . IBS (irritable bowel syndrome) 11/06/2012    Past Surgical History  Procedure Laterality Date  . No past surgeries      History   Social History  . Marital Status: Married    Spouse Name: N/A    Number of Children: N/A  . Years of Education: N/A   Occupational History  . Not on file.   Social History Main Topics  . Smoking status: Never Smoker   . Smokeless tobacco: Not on file  . Alcohol Use: No  . Drug Use: Not on file  . Sexual Activity: Not on file   Other Topics Concern  . Not on file   Social History Narrative        Medication List       This list is accurate as of: 11/07/14 11:59 PM.  Always use your most recent med list.               acetaminophen 500 MG tablet  Commonly known as:   TYLENOL  Take 1,000 mg by mouth every 6 (six) hours as needed for moderate pain.     pantoprazole 40 MG tablet  Commonly known as:  PROTONIX  Take 1 tablet (40 mg total) by mouth 2 (two) times daily before a meal.           Objective:   Physical Exam  Constitutional: He is oriented to person, place, and time. He appears well-developed. No distress.  BMI noted  HENT:  Head: Normocephalic and atraumatic.  Cardiovascular:  RRR, no murmur, rub or gallop  Pulmonary/Chest: Effort normal. No respiratory distress.  CTA B  Abdominal: Soft. Bowel sounds are normal. He exhibits no distension and no mass. There is no tenderness. There is no rebound and no guarding.  Musculoskeletal: Normal range of motion. He exhibits no edema or tenderness.  Neurological: He is alert and oriented to person, place, and time. No cranial nerve deficit. He exhibits normal muscle tone. Coordination normal.  Speech normal, gait unassisted and normal for age, motor strength appropriate for age   Skin: Skin is warm and dry. No pallor.  No jaundice  Psychiatric: He has a normal mood and affect. His behavior is normal. Judgment  and thought content normal.  Vitals reviewed.        Assessment & Plan:   Chest pain, Single episode of chest pain quite suggestive of GERD. He also has cardiovascular risk factors such as obesity and high cholesterol. Will treat GERD aggressively, if chest pain resurface will need workup. ER if symptoms severe. Pt in agreement  Hyperglycemia, check A1c   Problem List Items Addressed This Visit      Other   Hyperlipemia - Primary   Relevant Orders   Lipid panel    Other Visit Diagnoses    Hyperglycemia        Relevant Orders    Hemoglobin A1c

## 2014-11-08 DIAGNOSIS — R5383 Other fatigue: Secondary | ICD-10-CM | POA: Insufficient documentation

## 2014-11-08 NOTE — Assessment & Plan Note (Signed)
Not taking fenofibrate, I told patient he is high risk for strokes and other problems. Will check FLP and treat base on results.

## 2014-11-08 NOTE — Assessment & Plan Note (Signed)
He continue snoring and feeling  fatigued,  has gained weight lately; apnea Lynk ~3- 2014 was negative for sleep apnea, nevertheless I'm recommending him to work on his weight.

## 2014-11-08 NOTE — Assessment & Plan Note (Signed)
On Nexium OTC. Recommend to change to pantoprazole bid

## 2014-11-11 ENCOUNTER — Other Ambulatory Visit (INDEPENDENT_AMBULATORY_CARE_PROVIDER_SITE_OTHER): Payer: BLUE CROSS/BLUE SHIELD

## 2014-11-11 DIAGNOSIS — E785 Hyperlipidemia, unspecified: Secondary | ICD-10-CM

## 2014-11-11 DIAGNOSIS — R739 Hyperglycemia, unspecified: Secondary | ICD-10-CM

## 2014-11-11 LAB — LIPID PANEL
CHOL/HDL RATIO: 9
CHOLESTEROL: 282 mg/dL — AB (ref 0–200)
HDL: 30.3 mg/dL — ABNORMAL LOW (ref >39.00)
NONHDL: 251.7
Triglycerides: 394 mg/dL — ABNORMAL HIGH (ref 0.0–149.0)
VLDL: 78.8 mg/dL — ABNORMAL HIGH (ref 0.0–40.0)

## 2014-11-11 LAB — LDL CHOLESTEROL, DIRECT: Direct LDL: 174 mg/dL

## 2014-11-11 LAB — HEMOGLOBIN A1C: Hgb A1c MFr Bld: 6.6 % — ABNORMAL HIGH (ref 4.6–6.5)

## 2014-11-13 MED ORDER — FENOFIBRATE 150 MG PO CAPS: 150.0000 mg | ORAL_CAPSULE | Freq: Every day | ORAL | Status: DC

## 2014-11-13 MED ORDER — ATORVASTATIN CALCIUM 20 MG PO TABS: 20.0000 mg | ORAL_TABLET | Freq: Every day | ORAL | Status: DC

## 2014-11-13 NOTE — Addendum Note (Signed)
Addended by: Wilfrid Lund on: 11/13/2014 12:48 PM   Modules accepted: Orders

## 2014-12-20 ENCOUNTER — Encounter: Payer: Self-pay | Admitting: Dietician

## 2014-12-20 ENCOUNTER — Encounter: Payer: BLUE CROSS/BLUE SHIELD | Attending: Internal Medicine | Admitting: Dietician

## 2014-12-20 VITALS — Ht 70.0 in | Wt 262.0 lb

## 2014-12-20 DIAGNOSIS — Z713 Dietary counseling and surveillance: Secondary | ICD-10-CM | POA: Diagnosis not present

## 2014-12-20 DIAGNOSIS — E119 Type 2 diabetes mellitus without complications: Secondary | ICD-10-CM | POA: Diagnosis not present

## 2014-12-20 NOTE — Progress Notes (Signed)
Diabetes Self-Management Education  Visit Type:    Appt. Start Time: 1100 Appt. End Time: 4585  12/20/2014  Mr. Wayne Melton, identified by name and date of birth, is a 44 y.o. male with a diagnosis of Diabetes: Type 2.  Other people present during visit:  Patient   ASSESSMENT  Height 5\' 10"  (1.778 m), weight 262 lb (118.842 kg). Body mass index is 37.59 kg/(m^2).  Patient hx includes type 2 diabetes diagnosed 1 month ago and hyperlipidemia.  CHOL:  282, HDL 30, LDL 174, Trig:  394 11/11/14.  Patient also has IBS (constipation), made worse by overeating or increased fat content.  Initial Visit Information:  Are you currently following a meal plan?: No   Are you taking your medications as prescribed?: Yes Are you checking your feet?: No   How often do you need to have someone help you when you read instructions, pamphlets, or other written materials from your doctor or pharmacy?: 2 - Rarely (small print) What is the last grade level you completed in school?: Masters degree  Psychosocial:     Patient Belief/Attitude about Diabetes: Motivated to manage diabetes Self-care barriers: None Self-management support: Doctor's office, Family Other persons present: Patient Patient Concerns: Nutrition/Meal planning, Weight Control Special Needs: None Preferred Learning Style: No preference indicated Learning Readiness: Ready  Complications:   Last HgB A1C per patient/outside source: 6.6 mg/dL (11/11/14) How often do you check your blood sugar?: 0 times/day (not testing) Have you had a dilated eye exam in the past 12 months?: No Have you had a dental exam in the past 12 months?: No  Diet Intake:  Breakfast: honey nut cheerios and milk weekdays, eggs and bacon or sausage biscuit on weekend Snack (morning): Spreads lunch throughout the day Lunch: sandwich, soup, or chef boy r dee, with chips Snack (afternoon): spreads lunch throughout the day Dinner: frozen vegetables (half the  plate), meat (reduced to 1 portion), start (1/4 plate), or hamburger or [place] (no later than 6 or 7pm because of GERD) Snack (evening): none Beverage(s): water, diet soda, 50/50 sweet/unsweetened tea, coffee with 3 tsp sugar and cream, juice,   Exercise:  Exercise: ADL's  Individualized Plan for Diabetes Self-Management Training:   Learning Objective:  Patient will have a greater understanding of diabetes self-management.  Patient education plan per assessed needs and concerns is to attend individual sessions for     Education Topics Reviewed with Patient Today: Education patient regarding hyperlipidemia as well with label reading tips included. Definition of diabetes, type 1 and 2, and the diagnosis of diabetes Role of diet in the treatment of diabetes and the relationship between the three main macronutrients and blood glucose level, Food label reading, portion sizes and measuring food., Carbohydrate counting, Reviewed blood glucose goals for pre and post meals and how to evaluate the patients' food intake on their blood glucose level., Information on hints to eating out and maintain blood glucose control. Role of exercise on diabetes management, blood pressure control and cardiac health.   Interpreting lab values - A1C, lipid, urine microalbumina., Yearly dilated eye exam (SBG meter provided.  Patient knowlegdeable of use.  He will call or make an appt for questions.)   Relationship between chronic complications and blood glucose control, Dental care, Retinopathy and reason for yearly dilated eye exams Role of stress on diabetes   Lifestyle issues that need to be addressed for better diabetes care  PATIENTS GOALS/Plan (Developed by the patient):  Nutrition: Follow meal plan discussed, General guidelines for  healthy choices and portions discussed Physical Activity: 30 minutes per day (increast to 1 hour per day as tolerated) Medications: Not Applicable Monitoring : Not Applicable  (patient choice to check occasionally) Health Coping: discuss diabetes with (comment) (employee RN at work or call or email myself)  Plan:   Patient Instructions  Plan:  Aim for 4 Carb Choices per meal (60 grams) +/- 1 either way  Aim for 0-1 Carbs per snack if hungry  Include protein in moderation with your meals and snacks Consider reading food labels for Total Carbohydrate and Fat Grams of foods Consider  increasing your activity level by walking or biking or what you enjoy for 30 minutes daily as tolerated, increase to 1 hour per day as able and tolerated. Rethink what you drink.  Patient plans to increase exercise to improve blood sugar and lipids.  Expected Outcomes:  Demonstrated limited interest in learning.  Expect minimal changes  Education material provided: Living Well with Diabetes, Food label handouts, Meal plan card and Snack sheet  If problems or questions, patient to contact team via:  Phone and Email  Future DSME appointment: PRN

## 2014-12-20 NOTE — Patient Instructions (Signed)
Plan:  Aim for 4 Carb Choices per meal (60 grams) +/- 1 either way  Aim for 0-1 Carbs per snack if hungry  Include protein in moderation with your meals and snacks Consider reading food labels for Total Carbohydrate and Fat Grams of foods Consider  increasing your activity level by walking or biking or what you enjoy for 30 minutes daily as tolerated, increase to 1 hour per day as able and tolerated. Rethink what you drink.

## 2015-01-21 ENCOUNTER — Telehealth: Payer: Self-pay | Admitting: Internal Medicine

## 2015-01-21 NOTE — Telephone Encounter (Signed)
Pre Visit letter sent  °

## 2015-02-10 ENCOUNTER — Telehealth: Payer: Self-pay | Admitting: *Deleted

## 2015-02-10 ENCOUNTER — Encounter: Payer: Self-pay | Admitting: *Deleted

## 2015-02-10 NOTE — Telephone Encounter (Signed)
Unable to reach patient at time of Pre-Visit Call.  Left message for patient to return call when available.    

## 2015-02-10 NOTE — Telephone Encounter (Signed)
Pre-Visit Call completed with patient and chart updated.   Pre-Visit Info documented in Specialty Comments under SnapShot.    

## 2015-02-10 NOTE — Addendum Note (Signed)
Addended by: Leticia Penna A on: 02/10/2015 04:20 PM   Modules accepted: Medications

## 2015-02-11 ENCOUNTER — Encounter: Payer: Self-pay | Admitting: Internal Medicine

## 2015-02-11 ENCOUNTER — Ambulatory Visit (INDEPENDENT_AMBULATORY_CARE_PROVIDER_SITE_OTHER): Payer: BLUE CROSS/BLUE SHIELD | Admitting: Internal Medicine

## 2015-02-11 VITALS — BP 118/62 | HR 83 | Temp 98.7°F | Ht 70.0 in | Wt 254.0 lb

## 2015-02-11 DIAGNOSIS — Z0000 Encounter for general adult medical examination without abnormal findings: Secondary | ICD-10-CM

## 2015-02-11 DIAGNOSIS — R0683 Snoring: Secondary | ICD-10-CM

## 2015-02-11 DIAGNOSIS — Z23 Encounter for immunization: Secondary | ICD-10-CM | POA: Diagnosis not present

## 2015-02-11 DIAGNOSIS — E119 Type 2 diabetes mellitus without complications: Secondary | ICD-10-CM

## 2015-02-11 LAB — COMPREHENSIVE METABOLIC PANEL
ALT: 41 U/L (ref 0–53)
AST: 15 U/L (ref 0–37)
Albumin: 4.2 g/dL (ref 3.5–5.2)
Alkaline Phosphatase: 78 U/L (ref 39–117)
BILIRUBIN TOTAL: 0.6 mg/dL (ref 0.2–1.2)
BUN: 13 mg/dL (ref 6–23)
CO2: 22 mEq/L (ref 19–32)
Calcium: 9.6 mg/dL (ref 8.4–10.5)
Chloride: 107 mEq/L (ref 96–112)
Creatinine, Ser: 1.11 mg/dL (ref 0.40–1.50)
GFR: 77.36 mL/min (ref >60.00)
GLUCOSE: 78 mg/dL (ref 70–99)
Potassium: 3.8 mEq/L (ref 3.5–5.1)
SODIUM: 139 meq/L (ref 135–145)
Total Protein: 7.4 g/dL (ref 6.0–8.3)

## 2015-02-11 LAB — TSH: TSH: 1.12 u[IU]/mL (ref 0.35–4.50)

## 2015-02-11 LAB — LIPID PANEL
CHOLESTEROL: 178 mg/dL (ref 0–200)
HDL: 33.6 mg/dL — ABNORMAL LOW (ref >39.00)
NonHDL: 144.4
Total CHOL/HDL Ratio: 5
Triglycerides: 233 mg/dL — ABNORMAL HIGH (ref 0.0–149.0)
VLDL: 46.6 mg/dL — ABNORMAL HIGH (ref 0.0–40.0)

## 2015-02-11 LAB — HEMOGLOBIN A1C: Hgb A1c MFr Bld: 6.2 % (ref 4.6–6.5)

## 2015-02-11 LAB — LDL CHOLESTEROL, DIRECT: Direct LDL: 113 mg/dL

## 2015-02-11 MED ORDER — ATORVASTATIN CALCIUM 20 MG PO TABS: 20.0000 mg | ORAL_TABLET | Freq: Every day | ORAL | Status: DC

## 2015-02-11 MED ORDER — PANTOPRAZOLE SODIUM 40 MG PO TBEC: 40.0000 mg | DELAYED_RELEASE_TABLET | Freq: Two times a day (BID) | ORAL | Status: DC

## 2015-02-11 MED ORDER — FENOFIBRATE 150 MG PO CAPS: 150.0000 mg | ORAL_CAPSULE | Freq: Every day | ORAL | Status: DC

## 2015-02-11 NOTE — Patient Instructions (Signed)
Get your blood work before you leave    Come back to the office in 6 months   for a routine check up      If you need more information about a healthy diet,   visit  the American [employer] Association, it  is a Financial planner at:  http://www.richard-flynn.net/  All about diabetes, great resource!  InsuranceTransaction.co.za.[email or web address]

## 2015-02-11 NOTE — Progress Notes (Signed)
Subjective:    Patient ID: Wayne Melton, male    DOB: [birth year], 44 y.o.   MRN: [id number]  DOS:  02/11/2015 Type of visit - description : cpx Interval history: In general doing well, see review of systems. Good compliance of medications. Still fatigue-snoring    Review of Systems Constitutional: No fever, chills. No unexplained wt changes. No unusual sweats HEENT: No dental problems, ear discharge, facial swelling, voice changes. No eye discharge, redness or intolerance to light Respiratory: No wheezing or difficulty breathing. No cough , mucus production Cardiovascular: No CP, leg swelling or palpitations GI: no nausea, vomiting, diarrhea or abdominal pain.  No blood in the stools. No dysphagia   Endocrine: No polyphagia, polyuria or polydipsia GU: No dysuria, gross hematuria, difficulty urinating. No urinary urgency or frequency. Musculoskeletal:  Reports on and off pain at the site of the hip, pain decrease with Tylenol and physical activity and is worse in the mornings. Denies any actual knuckles swelling or redness. Skin: No change in the color of the skin, palor or rash Allergic, immunologic: + Environmental allergies, better with Zyrtec. Neurological: No dizziness or syncope. No headaches. No diplopia, slurred speech, motor deficits, facial numbness Hematological: No enlarged lymph nodes, easy bruising or bleeding Psychiatry: No suicidal ideas, hallucinations, behavior problems or confusion. No unusual/severe anxiety or depression.     Past Medical History  Diagnosis Date  . Allergic rhinitis 07/05/2012  . GERD , HH, h/o PUD 07/05/2012  . Hyperlipidemia   . IBS (irritable bowel syndrome) 11/06/2012  . Diabetes mellitus without complication     Past Surgical History  Procedure Laterality Date  . No past surgeries      History   Social History  . Marital Status: Married    Spouse Name: N/A  . Number of Children: N/A  . Years of Education: N/A    Occupational History  . Not on file.   Social History Main Topics  . Smoking status: Never Smoker   . Smokeless tobacco: Not on file  . Alcohol Use: No  . Drug Use: Not on file  . Sexual Activity: Not on file   Other Topics Concern  . Not on file   Social History Narrative     Family History  Problem Relation Age of Onset  . Pancreatitis Mother   . Diabetes Mother   . Hyperlipidemia Mother   . CAD Other     GF in his 64s  . Prostate cancer Other     GF in his 79s  . Colon cancer Other     uncle in his 75s  . Colon polyps Mother     late 40s       Medication List       This list is accurate as of: 02/11/15  2:00 PM.  Always use your most recent med list.               acetaminophen 500 MG tablet  Commonly known as:  TYLENOL  Take 1,000 mg by mouth every 6 (six) hours as needed for moderate pain.     atorvastatin 20 MG tablet  Commonly known as:  LIPITOR  Take 1 tablet (20 mg total) by mouth daily.     Fenofibrate 150 MG Caps  Take 1 capsule (150 mg total) by mouth daily.     pantoprazole 40 MG tablet  Commonly known as:  PROTONIX  Take 1 tablet (40 mg total) by mouth 2 (two) times daily before a meal.  Objective:   Physical Exam BP 118/62 mmHg  Pulse 83  Temp(Src) 98.7 F (37.1 C) (Oral)  Ht 5\' 10"  (1.778 m)  Wt 254 lb (115.214 kg)  BMI 36.45 kg/m2  SpO2 98% General:   Well developed, well nourished . NAD.  Neck:  Full range of motion. Supple. No  thyromegaly , normal carotid pulse HEENT:  Normocephalic . Face symmetric, atraumatic Lungs:  CTA B Normal respiratory effort, no intercostal retractions, no accessory muscle use. Heart: RRR,  no murmur.  No pretibial edema bilaterally  Abdomen:  Not distended, soft, non-tender. No rebound or rigidity. No mass,organomegaly Skin: Exposed areas without rash. Not pale. Not jaundice MSK: No TTP at the trochanteric bursa, hands and wrists without synovitis on exam Neurologic:   alert & oriented X3.  Speech normal, gait appropriate for age and unassisted Strength symmetric and appropriate for age.  Psych: Cognition and judgment appear intact.  Cooperative with normal attention span and concentration.  Behavior appropriate. No anxious or depressed appearing.        Assessment & Plan:

## 2015-02-11 NOTE — Assessment & Plan Note (Addendum)
Td 2010 pnm shot -- today   Has a family history of colon polyps (mother, early 51s ) and colon cancer ( uncle, mid 22s) Never had a colonoscopy, history of IBS.  Plan: Refer to GI ([place])  Diet and exercise discussed Labs   Other issues: Dyslipidemia, on 2 medications, refill meds, check FLP Mild diabetes, dx discussed, recommend to work on diet and exercise, recheck A1c. Eye exam rec. Did talk w/ a nutritionist few weeks ago History of fatigue -snoring, home sleep apnea test  Neg 2014.plan--- refer to pulmonary: overnight study ? arthralgias -- observation for now

## 2015-02-11 NOTE — Progress Notes (Signed)
Pre visit review using our clinic review tool, if applicable. No additional management support is needed unless otherwise documented below in the visit note. 

## 2015-02-12 LAB — MICROALBUMIN / CREATININE URINE RATIO
CREATININE, U: 283.4 mg/dL
MICROALB UR: 1 mg/dL (ref 0.0–1.9)
Microalb Creat Ratio: 0.4 mg/g (ref 0.0–30.0)

## 2015-02-12 LAB — HIV ANTIBODY (ROUTINE TESTING W REFLEX): HIV 1&2 Ab, 4th Generation: NONREACTIVE

## 2015-02-25 ENCOUNTER — Telehealth: Payer: Self-pay | Admitting: *Deleted

## 2015-02-25 NOTE — Telephone Encounter (Signed)
thx

## 2015-02-25 NOTE — Telephone Encounter (Signed)
Patient called stating that he was having high blood pressure readings.  Last blood pressure was 157/97.  Per patient his blood pressure had been higher and during that time he felt a "tugging" in his chest.  He denies chest pain, shortness of breath, dizziness, headaches, or blurred vision.    Patient scheduled with Dr. Larose Kells for 9:30 02/26/15.  Notified patient that if he had shortness of breath, dizziness, or chest pain to go to ED.  Stated understanding and agreed.

## 2015-02-26 ENCOUNTER — Encounter: Payer: Self-pay | Admitting: Internal Medicine

## 2015-02-26 ENCOUNTER — Ambulatory Visit (INDEPENDENT_AMBULATORY_CARE_PROVIDER_SITE_OTHER): Payer: BLUE CROSS/BLUE SHIELD | Admitting: Internal Medicine

## 2015-02-26 VITALS — BP 162/78 | HR 81 | Temp 97.9°F | Ht 70.0 in | Wt 257.4 lb

## 2015-02-26 DIAGNOSIS — R072 Precordial pain: Secondary | ICD-10-CM | POA: Diagnosis not present

## 2015-02-26 DIAGNOSIS — R079 Chest pain, unspecified: Secondary | ICD-10-CM | POA: Insufficient documentation

## 2015-02-26 MED ORDER — CARVEDILOL 6.25 MG PO TABS: 6.2500 mg | ORAL_TABLET | Freq: Two times a day (BID) | ORAL | Status: DC

## 2015-02-26 MED ORDER — NITROGLYCERIN 0.4 MG SL SUBL: 0.4000 mg | SUBLINGUAL_TABLET | SUBLINGUAL | Status: DC | PRN

## 2015-02-26 NOTE — Progress Notes (Signed)
Pre visit review using our clinic review tool, if applicable. No additional management support is needed unless otherwise documented below in the visit note. 

## 2015-02-26 NOTE — Patient Instructions (Signed)
Start Aspirin 81 mg 1 tab a day If Chest pain: use a nitroglycerin tablet under the tongue every 5 minutes. If after the first dose the chest pain is not going away: CALL 911 If the pain is severe, you have feel sweaty, have nausea: Call 911

## 2015-02-26 NOTE — Assessment & Plan Note (Addendum)
44 year old gentleman with history of diabetes, high cholesterol on statins and fenofibrate, + family history of CAD (G-father age 74) , no hypertension but BP x 1 day presents with a single episode of chest pain. Had chest pain earlier this year but at the time symptoms were clearly associated with GERD, this time no heartburn. EKG today iRBBB, at baseline Recent BMP wnl Plan: Stress test Add  Asa ntg prn ER if sx resurface, see instructions  Add coreg , monitor BPs RTC 4 weeks

## 2015-02-26 NOTE — Progress Notes (Signed)
Subjective:    Patient ID: Wayne Melton, male    DOB: [birth year], 44 y.o.   MRN: [id number]  DOS:  02/26/2015 Type of visit - description : acute Interval history: Yesterday at around 3.45 she was sitting at his desk working and developed palpitations described as "heart beating stronger" along with a soreness at the anterior chest, at the end of the sternum, left > R. He got concerned, somebody or check his blood pressure and it was 160, 157/97. Symptoms lasted 10 minutes, was not related to any food intake. Not associated with nausea, vomiting, diaphoresis. Symptoms have not returned. On further questions, he is acting playing softball, he may get some short of breath when he runs but no chest pain .   Review of Systems Denies fever chills No actual acid reflux lately No cough or sputum production When asked about stress, he has been under a lot of stress in the last 2 months d/t  work  Past Medical History  Diagnosis Date  . Allergic rhinitis 07/05/2012  . GERD , HH, h/o PUD 07/05/2012  . Hyperlipidemia   . IBS (irritable bowel syndrome) 11/06/2012  . Diabetes mellitus without complication     Past Surgical History  Procedure Laterality Date  . Sleep apnea test negative  2014    History   Social History  . Marital Status: Married    Spouse Name: N/A  . Number of Children: 3  . Years of Education: N/A   Occupational History  . busines analyst, computers    Social History Main Topics  . Smoking status: Never Smoker   . Smokeless tobacco: Not on file  . Alcohol Use: 0.0 oz/week    0 Standard drinks or equivalent per week     Comment: rare  . Drug Use: Not on file  . Sexual Activity: Not on file   Other Topics Concern  . Not on file   Social History Narrative        Medication List       This list is accurate as of: 02/26/15 11:59 PM.  Always use your most recent med list.               acetaminophen 500 MG tablet  Commonly known as:  TYLENOL    Take 1,000 mg by mouth every 6 (six) hours as needed for moderate pain.     atorvastatin 20 MG tablet  Commonly known as:  LIPITOR  Take 1 tablet (20 mg total) by mouth daily.     carvedilol 6.25 MG tablet  Commonly known as:  COREG  Take 1 tablet (6.25 mg total) by mouth 2 (two) times daily with a meal.     Fenofibrate 150 MG Caps  Take 1 capsule (150 mg total) by mouth daily.     nitroGLYCERIN 0.4 MG SL tablet  Commonly known as:  NITROSTAT  Place 1 tablet (0.4 mg total) under the tongue every 5 (five) minutes as needed for chest pain.     pantoprazole 40 MG tablet  Commonly known as:  PROTONIX  Take 1 tablet (40 mg total) by mouth 2 (two) times daily before a meal.           Objective:   Physical Exam BP 162/78 mmHg  Pulse 81  Temp(Src) 97.9 F (36.6 C) (Oral)  Ht 5\' 10"  (1.778 m)  Wt 257 lb 6 oz (116.745 kg)  BMI 36.93 kg/m2  SpO2 99%  General:   Well developed, well nourished .  NAD.  HEENT:  Normocephalic . Face symmetric, atraumatic Lungs:  CTA B Normal respiratory effort, no intercostal retractions, no accessory muscle use. Heart: RRR,  no murmur.  no pretibial edema bilaterally  Chest wall no TTP Abdomen:  Not distended, soft, non-tender. No rebound or rigidity. No mass,organomegaly Skin: Not pale. Not jaundice Neurologic:  alert & oriented X3.  Speech normal, gait appropriate for age and unassisted Psych--  Cognition and judgment appear intact.  Cooperative with normal attention span and concentration.  Behavior appropriate. No anxious or depressed appearing.      Assessment & Plan:

## 2015-03-12 ENCOUNTER — Encounter: Payer: Self-pay | Admitting: Internal Medicine

## 2015-03-12 ENCOUNTER — Other Ambulatory Visit: Payer: Self-pay

## 2015-03-12 MED ORDER — BECLOMETHASONE DIPROPIONATE 80 MCG/ACT NA AERS: 2.0000 | INHALATION_SPRAY | Freq: Every day | NASAL | Status: DC

## 2015-03-14 ENCOUNTER — Telehealth: Payer: Self-pay | Admitting: *Deleted

## 2015-03-14 NOTE — Telephone Encounter (Signed)
Pt dropped off a physical form to be completed. Form completed as much as possible and forwarded to Dr. Larose Kells. JG/CMA

## 2015-03-18 ENCOUNTER — Encounter (HOSPITAL_COMMUNITY): Payer: BLUE CROSS/BLUE SHIELD

## 2015-03-20 NOTE — Telephone Encounter (Signed)
Called and informed pt via voicemail that forms are ready for pick up at our front desk. Copy sent for scanning. JG//CMA

## 2015-03-26 ENCOUNTER — Encounter: Payer: Self-pay | Admitting: Gastroenterology

## 2015-03-26 ENCOUNTER — Ambulatory Visit: Payer: BLUE CROSS/BLUE SHIELD | Admitting: Internal Medicine

## 2015-03-27 ENCOUNTER — Telehealth (HOSPITAL_COMMUNITY): Payer: Self-pay | Admitting: *Deleted

## 2015-03-27 NOTE — Telephone Encounter (Signed)
Left message on voicemail per DPR in reference to upcoming appointment scheduled on 04/01/15 at 7:15 with detailed instructions given per Myocardial Perfusion Study Information Sheet for the test. LM to arrive 15 minutes early, and that it is imperative to arrive on time for appointment to keep from having the test rescheduled.Phone number given for call back for any questions. Hubbard Robinson, RN

## 2015-03-28 ENCOUNTER — Telehealth (HOSPITAL_COMMUNITY): Payer: Self-pay

## 2015-03-28 ENCOUNTER — Ambulatory Visit (INDEPENDENT_AMBULATORY_CARE_PROVIDER_SITE_OTHER): Payer: BLUE CROSS/BLUE SHIELD | Admitting: Internal Medicine

## 2015-03-28 ENCOUNTER — Encounter: Payer: Self-pay | Admitting: Internal Medicine

## 2015-03-28 VITALS — BP 118/74 | HR 62 | Temp 97.8°F | Ht 70.0 in | Wt 255.0 lb

## 2015-03-28 DIAGNOSIS — R072 Precordial pain: Secondary | ICD-10-CM | POA: Diagnosis not present

## 2015-03-28 DIAGNOSIS — E119 Type 2 diabetes mellitus without complications: Secondary | ICD-10-CM | POA: Insufficient documentation

## 2015-03-28 LAB — HM DIABETES FOOT EXAM: HM DIABETIC FOOT EXAM: NORMAL

## 2015-03-28 NOTE — Assessment & Plan Note (Signed)
No further pain, stress test pending

## 2015-03-28 NOTE — Patient Instructions (Addendum)
Use antifungal cream OTC between your toes until better

## 2015-03-28 NOTE — Telephone Encounter (Signed)
Patient given detailed instructions per Myocardial Perfusion Study Information Sheet for test on 04-01-2015 at 7:15am. Patient Notified to arrive 15 minutes early, and that it is imperative to arrive on time for appointment to keep from having the test rescheduled. Patient verbalized understanding. Wayne Melton, [name] A

## 2015-03-28 NOTE — Assessment & Plan Note (Addendum)
Feet exam negative today except for a very small fungal infection.we'll treat with OTCs. Recommend to have an eye check. Mildly increased blood pressure since last visit, now on Coreg, no side effects, BP is normal

## 2015-03-28 NOTE — Progress Notes (Signed)
Pre visit review using our clinic review tool, if applicable. No additional management support is needed unless otherwise documented below in the visit note. 

## 2015-03-28 NOTE — Progress Notes (Signed)
Subjective:    Patient ID: Wayne Melton, male    DOB: [birth year], 44 y.o.   MRN: [id number]  DOS:  03/28/2015 Type of visit - description : Follow-up Interval history: Since the last office visit, he started carvedilol, ambulatory blood pressures are great, around 120. No further chest pain, stress test pending   Review of Systems  Denies palpitations, some difficulty breathing only a very humid days. Otherwise no problems. Denies fatigue No nausea, vomiting, diarrhea Past Medical History  Diagnosis Date  . Allergic rhinitis 07/05/2012  . GERD , HH, h/o PUD 07/05/2012  . Hyperlipidemia   . IBS (irritable bowel syndrome) 11/06/2012  . Diabetes mellitus without complication     Past Surgical History  Procedure Laterality Date  . Sleep apnea test negative  2014    History   Social History  . Marital Status: Married    Spouse Name: N/A  . Number of Children: 3  . Years of Education: N/A   Occupational History  . busines analyst, computers    Social History Main Topics  . Smoking status: Never Smoker   . Smokeless tobacco: Not on file  . Alcohol Use: 0.0 oz/week    0 Standard drinks or equivalent per week     Comment: rare  . Drug Use: Not on file  . Sexual Activity: Not on file   Other Topics Concern  . Not on file   Social History Narrative        Medication List       This list is accurate as of: 03/28/15 11:59 PM.  Always use your most recent med list.               acetaminophen 500 MG tablet  Commonly known as:  TYLENOL  Take 1,000 mg by mouth every 6 (six) hours as needed for moderate pain.     atorvastatin 20 MG tablet  Commonly known as:  LIPITOR  Take 1 tablet (20 mg total) by mouth daily.     Beclomethasone Dipropionate 80 MCG/ACT Aers  Place 2 sprays into the nose daily.     carvedilol 6.25 MG tablet  Commonly known as:  COREG  Take 1 tablet (6.25 mg total) by mouth 2 (two) times daily with a meal.     Fenofibrate 150 MG Caps    Take 1 capsule (150 mg total) by mouth daily.     nitroGLYCERIN 0.4 MG SL tablet  Commonly known as:  NITROSTAT  Place 1 tablet (0.4 mg total) under the tongue every 5 (five) minutes as needed for chest pain.     pantoprazole 40 MG tablet  Commonly known as:  PROTONIX  Take 1 tablet (40 mg total) by mouth 2 (two) times daily before a meal.           Objective:   Physical Exam BP 118/74 mmHg  Pulse 62  Temp(Src) 97.8 F (36.6 C) (Oral)  Ht 5\' 10"  (1.778 m)  Wt 255 lb (115.667 kg)  BMI 36.59 kg/m2  SpO2 97% General:   Well developed, well nourished . NAD.  HEENT:  Normocephalic . Face symmetric, atraumatic Lungs:  CTA B Normal respiratory effort, no intercostal retractions, no accessory muscle use. Heart: RRR,  no murmur.  No pretibial edema bilaterally  Diabetic foot exam: Pinprick examination normal, no edema, good pedal pulses. Mild redness-scaliness between the second and third toes bilaterally. Neurologic:  alert & oriented X3.  Speech normal, gait appropriate for age and unassisted Psych--  Cognition and judgment appear intact.  Cooperative with normal attention span and concentration.  Behavior appropriate. No anxious or depressed appearing.      Assessment & Plan:

## 2015-04-01 ENCOUNTER — Ambulatory Visit (HOSPITAL_COMMUNITY): Payer: BLUE CROSS/BLUE SHIELD | Attending: Internal Medicine

## 2015-04-01 DIAGNOSIS — R072 Precordial pain: Secondary | ICD-10-CM

## 2015-04-01 LAB — MYOCARDIAL PERFUSION IMAGING
Estimated workload: 12.4 METS
Exercise duration (min): 11 min
Exercise duration (sec): 15 s
LV dias vol: 103 mL
LV sys vol: 38 mL
MPHR: 166 {beats}/min
Peak HR: 166 {beats}/min
Percent HR: 93 %
RATE: 0.34
RPE: 17
Rest HR: 75 {beats}/min
SDS: 0
SRS: 6
SSS: 6
TID: 0.85

## 2015-04-01 MED ORDER — TECHNETIUM TC 99M SESTAMIBI GENERIC - CARDIOLITE
30.8000 | Freq: Once | INTRAVENOUS | Status: AC | PRN
  Administered 2015-04-01: 30.8 via INTRAVENOUS

## 2015-04-01 MED ORDER — TECHNETIUM TC 99M SESTAMIBI GENERIC - CARDIOLITE
11.0000 | Freq: Once | INTRAVENOUS | Status: AC | PRN
  Administered 2015-04-01: 11 via INTRAVENOUS

## 2015-04-11 ENCOUNTER — Encounter: Payer: Self-pay | Admitting: Pulmonary Disease

## 2015-04-11 ENCOUNTER — Ambulatory Visit (INDEPENDENT_AMBULATORY_CARE_PROVIDER_SITE_OTHER): Payer: BLUE CROSS/BLUE SHIELD | Admitting: Pulmonary Disease

## 2015-04-11 VITALS — BP 114/76 | HR 70 | Temp 97.0°F | Ht 72.0 in | Wt 248.0 lb

## 2015-04-11 DIAGNOSIS — G4733 Obstructive sleep apnea (adult) (pediatric): Secondary | ICD-10-CM

## 2015-04-11 NOTE — Patient Instructions (Signed)
Will arrange for sleep study Will call to arrange for follow up after sleep study reviewed 

## 2015-04-11 NOTE — Progress Notes (Deleted)
   Subjective:    Patient ID: Wayne Melton, male    DOB: April 12, 1971, 43 y.o.   MRN: [id number]  HPI    Review of Systems  Constitutional: Negative for fever and unexpected weight change.  HENT: Positive for congestion and ear pain. Negative for dental problem, nosebleeds, postnasal drip, rhinorrhea, sinus pressure, sneezing, sore throat and trouble swallowing.   Eyes: Negative for redness and itching.  Respiratory: Positive for shortness of breath. Negative for cough, chest tightness and wheezing.   Cardiovascular: Positive for leg swelling. Negative for palpitations.  Gastrointestinal: Positive for abdominal pain. Negative for nausea and vomiting.  Genitourinary: Negative for dysuria.  Musculoskeletal: Negative for joint swelling.  Skin: Negative for rash.  Neurological: Negative for headaches.  Hematological: Does not bruise/bleed easily.  Psychiatric/Behavioral: Negative for dysphoric mood. The patient is not nervous/anxious.        Objective:   Physical Exam        Assessment & Plan:

## 2015-04-11 NOTE — Progress Notes (Signed)
Chief Complaint  Patient presents with  . Sleep Consult    epworth 13. refer Dr. Larose Kells. HST done 12/03/12. C/o fatigue, falling asleep during the day if he is not active.     History of Present Illness: Wayne Melton is a 44 y.o. male for evaluation of sleep problems.  He has noticed trouble with his sleep for some time.  This has been getting worse.  He doesn't have trouble falling asleep, but has trouble staying asleep.  He will wake up feeling like he can't breath.  His wife is concerned about his snoring, and has told him that his breathing gets shallow.  He has trouble staying awake during the day, and can fall asleep randomly when he is sitting down.  He had home sleep study which showed AHI of 3.9.  He says his sleep was terrible while using home device and he did not sleep much.  He goes to sleep at 10 pm.  He falls asleep quickly.  He wakes up 1 or 2 times to use the bathroom.  He gets out of bed at 530 am.  He feels tired in the morning.  He denies morning headache.  He does not use anything to help him fall sleep.  He will drink coffee in the morning to stay awake.  He denies sleep walking, sleep talking, bruxism, or nightmares.  There is no history of restless legs.  He denies sleep hallucinations, sleep paralysis, or cataplexy.  The Epworth score is 13 out of 24.   Wayne Melton  has a past medical history of Allergic rhinitis (07/05/2012); GERD , HH, h/o PUD (07/05/2012); Hyperlipidemia; IBS (irritable bowel syndrome) (11/06/2012); and Diabetes mellitus without complication.  Wayne Melton  has past surgical history that includes sleep apnea test negative (2014).  Prior to Admission medications   Medication Sig Start Date End Date Taking? Authorizing Provider  acetaminophen (TYLENOL) 500 MG tablet Take 1,000 mg by mouth every 6 (six) hours as needed for moderate pain.   Yes Historical Provider, MD  atorvastatin (LIPITOR) 20 MG tablet Take 1 tablet (20 mg total) by mouth daily.  02/11/15  Yes Colon Branch, MD  Beclomethasone Dipropionate 80 MCG/ACT AERS Place 2 sprays into the nose daily. 03/12/15  Yes Colon Branch, MD  carvedilol (COREG) 6.25 MG tablet Take 1 tablet (6.25 mg total) by mouth 2 (two) times daily with a meal. 02/26/15  Yes Colon Branch, MD  Fenofibrate 150 MG CAPS Take 1 capsule (150 mg total) by mouth daily. 02/11/15  Yes Colon Branch, MD  loratadine (CLARITIN) 10 MG tablet Take 10 mg by mouth daily.   Yes Historical Provider, MD  nitroGLYCERIN (NITROSTAT) 0.4 MG SL tablet Place 1 tablet (0.4 mg total) under the tongue every 5 (five) minutes as needed for chest pain. 02/26/15  Yes Colon Branch, MD  pantoprazole (PROTONIX) 40 MG tablet Take 1 tablet (40 mg total) by mouth 2 (two) times daily before a meal. 02/11/15  Yes Colon Branch, MD    No Known Allergies  His family history includes CAD in his other; Colon cancer in his other; Colon polyps in his mother; Diabetes in his mother; Hyperlipidemia in his mother; Mitral valve prolapse in his mother; Pancreatitis in his mother; Prostate cancer in his other.  He  reports that he quit smoking about 6 years ago. His smoking use included Cigarettes. He has a 1.5 pack-year smoking history. He does not have any smokeless tobacco history on file.  He reports that he drinks alcohol.  Review of Systems  Constitutional: Negative for fever and unexpected weight change.  HENT: Positive for congestion and ear pain. Negative for dental problem, nosebleeds, postnasal drip, rhinorrhea, sinus pressure, sneezing, sore throat and trouble swallowing.   Eyes: Negative for redness and itching.  Respiratory: Positive for shortness of breath. Negative for cough, chest tightness and wheezing.   Cardiovascular: Positive for leg swelling. Negative for palpitations.  Gastrointestinal: Positive for abdominal pain. Negative for nausea and vomiting.  Genitourinary: Negative for dysuria.  Musculoskeletal: Negative for joint swelling.  Skin: Negative for  rash.  Neurological: Negative for headaches.  Hematological: Does not bruise/bleed easily.  Psychiatric/Behavioral: Negative for dysphoric mood. The patient is not nervous/anxious.    Physical Exam: BP 114/76 mmHg  Pulse 70  Temp(Src) 97 F (36.1 C) (Oral)  Ht 6' (1.829 m)  Wt 248 lb (112.492 kg)  BMI 33.63 kg/m2  SpO2 96%  General - No distress ENT - No sinus tenderness, no oral exudate, no LAN, no thyromegaly, TM clear, pupils equal/reactive, MP 3, narrow nasal angle Cardiac - s1s2 regular, no murmur, pulses symmetric Chest - No wheeze/rales/dullness, good air entry, normal respiratory excursion Back - No focal tenderness Abd - Soft, non-tender, no organomegaly, + bowel sounds Ext - No edema Neuro - Normal strength, cranial nerves intact Skin - No rashes Psych - Normal mood, and behavior  Discussion: He has snoring, sleep disruption, witnessed apnea, and daytime sleepiness  He had home sleep study which showed apneic respiratory events.  I am concerned he has sleep apnea.  I explained to him the limitations of home sleep testing devices regarding false negative results.  Assessment/plan:  Obstructive sleep apnea. Plan: - will arrange for in lab sleep study to more accurately determine when he is asleep during testing to get more accurate assessment of his AHI   Chesley Mires, M.D. Pager [phone]

## 2015-06-11 ENCOUNTER — Encounter: Payer: Self-pay | Admitting: Internal Medicine

## 2015-06-11 ENCOUNTER — Ambulatory Visit (INDEPENDENT_AMBULATORY_CARE_PROVIDER_SITE_OTHER): Payer: BLUE CROSS/BLUE SHIELD | Admitting: Internal Medicine

## 2015-06-11 ENCOUNTER — Encounter: Payer: Self-pay | Admitting: Gastroenterology

## 2015-06-11 VITALS — BP 124/76 | HR 77 | Temp 98.0°F | Ht 72.0 in | Wt 261.2 lb

## 2015-06-11 DIAGNOSIS — Z09 Encounter for follow-up examination after completed treatment for conditions other than malignant neoplasm: Secondary | ICD-10-CM

## 2015-06-11 DIAGNOSIS — R1013 Epigastric pain: Secondary | ICD-10-CM | POA: Diagnosis not present

## 2015-06-11 MED ORDER — DICYCLOMINE HCL 10 MG PO CAPS: 10.0000 mg | ORAL_CAPSULE | Freq: Three times a day (TID) | ORAL | Status: DC | PRN

## 2015-06-11 NOTE — Progress Notes (Signed)
Subjective:    Patient ID: Wayne Melton, male    DOB: 04-25-1971, 44 y.o.   MRN: [id number]  DOS:  06/11/2015 Type of visit - description : Acute Interval history:  Symptoms started 2 months ago: Having increased post prandial burping and flatus along with feeling bloated and upper abdominal discomfort that last an hour or 2. Symptoms follow most meals, sometimes also at 3AM in the morning even without eating. Has GERD, good compliance with Protonix twice a day, yet he still has heartburn. Lately has noted days that he has watery diarrhea and then some days he is constipated. Denies taking excessive Motrin History of PUD, IBS.   Review of Systems Denies fever, chills or weight loss No chest pain or difficulty breathing No blood in the stools No dysphagia or odynophagia  Past Medical History  Diagnosis Date  . Allergic rhinitis 07/05/2012  . GERD , HH, h/o PUD 07/05/2012  . Hyperlipidemia   . IBS (irritable bowel syndrome) 11/06/2012  . Diabetes mellitus without complication     Past Surgical History  Procedure Laterality Date  . Sleep apnea test negative  2014    Social History   Social History  . Marital Status: Married    Spouse Name: N/A  . Number of Children: 3  . Years of Education: N/A   Occupational History  . busines analyst, computers    Social History Main Topics  . Smoking status: Former Smoker -- 0.50 packs/day for 3 years    Types: Cigarettes    Quit date: 10/04/2008  . Smokeless tobacco: Not on file  . Alcohol Use: 0.0 oz/week    0 Standard drinks or equivalent per week     Comment: rare  . Drug Use: Not on file  . Sexual Activity: Not on file   Other Topics Concern  . Not on file   Social History Narrative        Medication List       This list is accurate as of: 06/11/15 11:59 PM.  Always use your most recent med list.               acetaminophen 500 MG tablet  Commonly known as:  TYLENOL  Take 1,000 mg by mouth every 6 (six)  hours as needed for moderate pain.     atorvastatin 20 MG tablet  Commonly known as:  LIPITOR  Take 1 tablet (20 mg total) by mouth daily.     Beclomethasone Dipropionate 80 MCG/ACT Aers  Place 2 sprays into the nose daily.     carvedilol 6.25 MG tablet  Commonly known as:  COREG  Take 1 tablet (6.25 mg total) by mouth 2 (two) times daily with a meal.     CLARITIN 10 MG tablet  Generic drug:  loratadine  Take 10 mg by mouth daily.     dicyclomine 10 MG capsule  Commonly known as:  BENTYL  Take 1 capsule (10 mg total) by mouth 3 (three) times daily with meals as needed for spasms.     Fenofibrate 150 MG Caps  Take 1 capsule (150 mg total) by mouth daily.     nitroGLYCERIN 0.4 MG SL tablet  Commonly known as:  NITROSTAT  Place 1 tablet (0.4 mg total) under the tongue every 5 (five) minutes as needed for chest pain.     pantoprazole 40 MG tablet  Commonly known as:  PROTONIX  Take 1 tablet (40 mg total) by mouth 2 (two) times daily  before a meal.           Objective:   Physical Exam BP 124/76 mmHg  Pulse 77  Temp(Src) 98 F (36.7 C) (Oral)  Ht 6' (1.829 m)  Wt 261 lb 4 oz (118.502 kg)  BMI 35.42 kg/m2  SpO2 97% General:   Well developed, well nourished . NAD.  HEENT:  Normocephalic . Face symmetric, atraumatic Lungs:  CTA B Normal respiratory effort, no intercostal retractions, no accessory muscle use. Heart: RRR,  no murmur.  no pretibial edema bilaterally  Abdomen:  Not distended, soft, minimal tenderness at the epigastrium without mass or rebound. Groins: No LADs or hernias. Slightly increased bowel sounds Skin: Not pale. Not jaundice Neurologic:  alert & oriented X3.  Speech normal, gait appropriate for age and unassisted Psych--  Cognition and judgment appear intact.  Cooperative with normal attention span and concentration.  Behavior appropriate. No anxious or depressed appearing.    Assessment & Plan:   Problem list  > Hyperlipidemia Diabetes GI: --History of PUD (EGD @ [facility]  2002: neg  but reports had another EGD with ulcer) --IBS --GERD Other: - neg sleep apnea test 2014 - neg stress test 03/2015  A/P Dyspepsia: Increased GI symptoms for the last 3 months. DDX: PUD, increased GERD, gastroparesis (h/o DM), IBS, others.  Plan: We'll get a CBC, LFT, abdominal ultrasound. Start probiotics and Bentyl. Already has an appointment to see GI   next month.

## 2015-06-11 NOTE — Patient Instructions (Addendum)
Get your blood work before you leave     Continue Protonix twice a day Started OTC probiotic 1 tablet daily Try Bentyl 3 times a day as needed for cramps  Call anytime if the symptoms get worse

## 2015-06-11 NOTE — Progress Notes (Signed)
Pre visit review using our clinic review tool, if applicable. No additional management support is needed unless otherwise documented below in the visit note. 

## 2015-06-12 ENCOUNTER — Encounter (HOSPITAL_BASED_OUTPATIENT_CLINIC_OR_DEPARTMENT_OTHER): Payer: BLUE CROSS/BLUE SHIELD

## 2015-06-12 DIAGNOSIS — Z09 Encounter for follow-up examination after completed treatment for conditions other than malignant neoplasm: Secondary | ICD-10-CM | POA: Insufficient documentation

## 2015-06-12 LAB — CBC WITH DIFFERENTIAL/PLATELET
Basophils Absolute: 0.1 10*3/uL (ref 0.0–0.1)
Basophils Relative: 0.6 % (ref 0.0–3.0)
Eosinophils Absolute: 0.2 10*3/uL (ref 0.0–0.7)
Eosinophils Relative: 1.8 % (ref 0.0–5.0)
HEMATOCRIT: 44.4 % (ref 39.0–52.0)
Hemoglobin: 14.8 g/dL (ref 13.0–17.0)
Lymphocytes Relative: 25.7 % (ref 12.0–46.0)
Lymphs Abs: 2.4 10*3/uL (ref 0.7–4.0)
MCHC: 33.3 g/dL (ref 30.0–36.0)
MCV: 90.6 fl (ref 78.0–100.0)
Monocytes Absolute: 0.6 10*3/uL (ref 0.1–1.0)
Monocytes Relative: 6.4 % (ref 3.0–12.0)
NEUTROS ABS: 6.1 10*3/uL (ref 1.4–7.7)
Neutrophils Relative %: 65.5 % (ref 43.0–77.0)
PLATELETS: 381 10*3/uL (ref 150.0–400.0)
RBC: 4.9 Mil/uL (ref 4.22–5.81)
RDW: 12.7 % (ref 11.5–15.5)
WBC: 9.3 10*3/uL (ref 4.0–10.5)

## 2015-06-12 LAB — HEPATIC FUNCTION PANEL
ALT: 23 U/L (ref 0–53)
AST: 9 U/L (ref 0–37)
Albumin: 4.3 g/dL (ref 3.5–5.2)
Alkaline Phosphatase: 66 U/L (ref 39–117)
BILIRUBIN DIRECT: 0.1 mg/dL (ref 0.0–0.3)
Total Bilirubin: 0.4 mg/dL (ref 0.2–1.2)
Total Protein: 7.3 g/dL (ref 6.0–8.3)

## 2015-06-12 NOTE — Assessment & Plan Note (Signed)
Dyspepsia: Increased GI symptoms for the last 3 months. DDX: PUD, increased GERD, gastroparesis (h/o DM), IBS, others.  Plan: We'll get a CBC, LFT, abdominal ultrasound. Start probiotics and Bentyl. Already has an appointment to see GI   next month.

## 2015-06-13 ENCOUNTER — Ambulatory Visit (HOSPITAL_BASED_OUTPATIENT_CLINIC_OR_DEPARTMENT_OTHER)
Admission: RE | Admit: 2015-06-13 | Discharge: 2015-06-13 | Disposition: A | Discharge status: Home / Self Care | Payer: BLUE CROSS/BLUE SHIELD | Source: Ambulatory Visit | Attending: Internal Medicine | Admitting: Internal Medicine

## 2015-06-13 DIAGNOSIS — E785 Hyperlipidemia, unspecified: Secondary | ICD-10-CM | POA: Diagnosis not present

## 2015-06-13 DIAGNOSIS — E119 Type 2 diabetes mellitus without complications: Secondary | ICD-10-CM | POA: Insufficient documentation

## 2015-06-13 DIAGNOSIS — K760 Fatty (change of) liver, not elsewhere classified: Secondary | ICD-10-CM | POA: Insufficient documentation

## 2015-06-13 DIAGNOSIS — R110 Nausea: Secondary | ICD-10-CM | POA: Insufficient documentation

## 2015-06-13 DIAGNOSIS — K589 Irritable bowel syndrome without diarrhea: Secondary | ICD-10-CM | POA: Diagnosis not present

## 2015-06-13 DIAGNOSIS — R1013 Epigastric pain: Secondary | ICD-10-CM | POA: Diagnosis present

## 2015-06-13 DIAGNOSIS — K219 Gastro-esophageal reflux disease without esophagitis: Secondary | ICD-10-CM | POA: Insufficient documentation

## 2015-07-04 ENCOUNTER — Other Ambulatory Visit: Payer: Self-pay | Admitting: Internal Medicine

## 2015-07-29 ENCOUNTER — Ambulatory Visit (INDEPENDENT_AMBULATORY_CARE_PROVIDER_SITE_OTHER): Payer: BLUE CROSS/BLUE SHIELD | Admitting: Gastroenterology

## 2015-07-29 ENCOUNTER — Encounter: Payer: Self-pay | Admitting: Gastroenterology

## 2015-07-29 VITALS — BP 110/70 | HR 72 | Ht 71.25 in | Wt 271.4 lb

## 2015-07-29 DIAGNOSIS — K219 Gastro-esophageal reflux disease without esophagitis: Secondary | ICD-10-CM

## 2015-07-29 DIAGNOSIS — K760 Fatty (change of) liver, not elsewhere classified: Secondary | ICD-10-CM

## 2015-07-29 DIAGNOSIS — R1032 Left lower quadrant pain: Secondary | ICD-10-CM

## 2015-07-29 DIAGNOSIS — K5900 Constipation, unspecified: Secondary | ICD-10-CM

## 2015-07-29 NOTE — Progress Notes (Signed)
HPI :  44 y/o male here in consultation for abdominal pain and cramping by Dr. Kathlene November.   He has had some LLQ pain for the past few months. He reports sharp pain that bothered him more so when it initially started, but he thinks can be intermittent over the past few months. He describes a cramping sensation after he eats, within 20-30 minutes or so. He reports he was having some gas pains when symptoms first started. He reports he initially had some diarrhea but now with constipation. He has one BM per day, he denies straining at present time but can pass hard stools. He reports having a bowel movement can help take away some of his abdominal pains. He denies any blood in the stools. He is eating okay, no vomiting. He has some nausea after he eats, and endorses some postprandial reflux / pyrosis. No dysphagia. He reports stable weight. He has been on protonix for about a year or so. He reports if he takes it routinely he usually does not have symptoms of reflux and denies significant breakthrough.  He was given bentyl to take PRN since the summer time. He reports it doesn't help his cramps, not too much.   He reports his mother had colon polyps at age 10s. Patient's uncle (mother's side) had colon cancer age 92 or so. He thinks his grandfather had colon cancer as well.  He has never had a prior colonoscopy.   He has had a prior EGD in 2002 showing hiatal hernia, and then again in 2007, records of the latter not available. No history of Barrett's   He otherwise had an abdominal US done in September for these symptoms showing fatty liver but no other acute pathology. He denies history of liver disease, no FH of liver disease. No history of jaundice.   Past Medical History  Diagnosis Date  . Allergic rhinitis 07/05/2012  . GERD , HH, h/o PUD 07/05/2012  . Hyperlipidemia   . IBS (irritable bowel syndrome) 11/06/2012  . Diabetes mellitus without complication ([place])   . Seasonal allergies   . Obesity        Past Surgical History  Procedure Laterality Date  . Sleep apnea test negative  2014   Family History  Problem Relation Age of Onset  . Pancreatitis Mother   . Diabetes Mother   . Hyperlipidemia Mother   . CAD Other     GF in his 2s  . Prostate cancer Other     GF in his 15s  . Colon cancer Maternal Uncle     uncle in his 54s  . Colon polyps Mother     late 42s  . Mitral valve prolapse Mother   . Irritable bowel syndrome    . Colitis    . Heart disease     Social History  Substance Use Topics  . Smoking status: Former Smoker -- 0.50 packs/day for 3 years    Types: Cigarettes    Quit date: 10/04/2008  . Smokeless tobacco: Never Used  . Alcohol Use: 0.0 oz/week    0 Standard drinks or equivalent per week     Comment: rare   Current Outpatient Prescriptions  Medication Sig Dispense Refill  . acetaminophen (TYLENOL) 500 MG tablet Take 1,000 mg by mouth every 6 (six) hours as needed for moderate pain.    Marland Kitchen atorvastatin (LIPITOR) 20 MG tablet Take 1 tablet (20 mg total) by mouth daily. 30 tablet 8  . Beclomethasone Dipropionate 80  MCG/ACT AERS Place 2 sprays into the nose daily. (Patient taking differently: Place 2 sprays into the nose daily. ) 1 Inhaler 11  . carvedilol (COREG) 6.25 MG tablet Take 1 tablet (6.25 mg total) by mouth 2 (two) times daily with a meal. 60 tablet 5  . dicyclomine (BENTYL) 10 MG capsule Take 1 capsule (10 mg total) by mouth 3 (three) times daily with meals as needed for spasms. 60 capsule 6  . Fenofibrate 150 MG CAPS Take 1 capsule (150 mg total) by mouth daily. 30 each 8  . loratadine (CLARITIN) 10 MG tablet Take 10 mg by mouth daily.    . nitroGLYCERIN (NITROSTAT) 0.4 MG SL tablet Place 1 tablet (0.4 mg total) under the tongue every 5 (five) minutes as needed for chest pain. 20 tablet 1  . pantoprazole (PROTONIX) 40 MG tablet Take 1 tablet (40 mg total) by mouth 2 (two) times daily before a meal. 60 tablet 8   No current facility-administered  medications for this visit.   No Known Allergies   Review of Systems: All systems reviewed and negative except where noted in HPI.   Lab Results  Component Value Date   WBC 9.3 06/11/2015   HGB 14.8 06/11/2015   HCT 44.4 06/11/2015   MCV 90.6 06/11/2015   PLT 381.0 06/11/2015    Lab Results  Component Value Date   ALT 23 06/11/2015   AST 9 06/11/2015   ALKPHOS 66 06/11/2015   BILITOT 0.4 06/11/2015    Lab Results  Component Value Date   CREATININE 1.11 02/11/2015   BUN 13 02/11/2015   NA 139 02/11/2015   K 3.8 02/11/2015   CL 107 02/11/2015   CO2 22 02/11/2015   Abdominal US as outlined above in HPI  Physical Exam: BP 110/70 mmHg  Pulse 72  Ht 5' 11.25" (1.81 m)  Wt 271 lb 6 oz (123.095 kg)  BMI 37.57 kg/m2 Constitutional: Pleasant,well-developed, male in no acute distress. HEENT: Normocephalic and atraumatic. Conjunctivae are normal. No scleral icterus. Neck supple.  Cardiovascular: Normal rate, regular rhythm.  Pulmonary/chest: Effort normal and breath sounds normal. No wheezing, rales or rhonchi. Abdominal: Soft, nondistended, protuberant, mild LLQ TTP without rebound or guarding. Negative [place]. Bowel sounds active throughout. There are no masses palpable. No hepatomegaly. Extremities: no edema Lymphadenopathy: No cervical adenopathy noted. Neurological: Alert and oriented to person place and time. Skin: Skin is warm and dry. No rashes noted. Psychiatric: Normal mood and affect. Behavior is normal.   ASSESSMENT AND PLAN: 44 y/o male presenting with LLQ pain and some changes in his bowels, mild constipation. Given bentyl without improvement thus far. Symptoms have persisted over months. He has a FH of polyps and colon cancer at a young age, with 2 x 2nd degree relatives with colon cancer. While his symptoms could be due to IBS, I offered him a colonoscopy in light of his family history to ensure normal. Will treat with some miralax in the interim to see if  treating his mild constipation helps. The indications, risks, and benefits of colonoscopy were explained to the patient in detail. Risks include but are not limited to bleeding, perforation, adverse reaction to medications, and cardiopulmonary compromise. Sequelae include but are not limited to the possibility of surgery, hospitalization, and mortality. The patient verbalized understanding and wished to proceed. All questions answered, referred to the scheduler and bowel prep ordered. Further recommendations pending results of the exam.   GERD - seems well controlled on protonix, he has had  2 prior EGDs without Barrett's. Continue lowest dose of PPI needed to control symptoms. F/u PRN for this issue.   Fatty liver - normal ALT, noted incidentally on imaging. Discussed fatty liver in general and spectrum of liver disease. Repeat LFTs in 6-12 months, minimize EtOH and work on diet / exercise to normalize BMI. He agreed all questions answered.   Tahoka Cellar, MD [facility] Pager [phone]   CC: Dr. Kathlene November

## 2015-07-29 NOTE — Patient Instructions (Signed)
You have been scheduled for a colonoscopy. Please follow written instructions given to you at your visit today.  Please pick up your prep supplies at the pharmacy within the next 1-3 days. If you use inhalers (even only as needed), please bring them with you on the day of your procedure. Your physician has requested that you go to www.startemmi.com and enter the access code given to you at your visit today. This web site gives a general overview about your procedure. However, you should still follow specific instructions given to you by our office regarding your preparation for the procedure.  Start taking Miralax daily.

## 2015-07-30 ENCOUNTER — Other Ambulatory Visit: Payer: Self-pay

## 2015-07-30 DIAGNOSIS — K219 Gastro-esophageal reflux disease without esophagitis: Secondary | ICD-10-CM

## 2015-07-30 DIAGNOSIS — R1032 Left lower quadrant pain: Secondary | ICD-10-CM

## 2015-07-30 DIAGNOSIS — K5900 Constipation, unspecified: Secondary | ICD-10-CM

## 2015-07-30 DIAGNOSIS — K760 Fatty (change of) liver, not elsewhere classified: Secondary | ICD-10-CM

## 2015-07-30 MED ORDER — NA SULFATE-K SULFATE-MG SULF 17.5-3.13-1.6 GM/177ML PO SOLN: ORAL | Status: DC

## 2015-08-19 ENCOUNTER — Ambulatory Visit (INDEPENDENT_AMBULATORY_CARE_PROVIDER_SITE_OTHER): Payer: BLUE CROSS/BLUE SHIELD | Admitting: Internal Medicine

## 2015-08-19 ENCOUNTER — Encounter: Payer: Self-pay | Admitting: Internal Medicine

## 2015-08-19 VITALS — BP 108/78 | HR 63 | Temp 97.8°F | Ht 71.0 in | Wt 264.4 lb

## 2015-08-19 DIAGNOSIS — Z23 Encounter for immunization: Secondary | ICD-10-CM

## 2015-08-19 DIAGNOSIS — Z09 Encounter for follow-up examination after completed treatment for conditions other than malignant neoplasm: Secondary | ICD-10-CM

## 2015-08-19 DIAGNOSIS — R1013 Epigastric pain: Secondary | ICD-10-CM | POA: Diagnosis not present

## 2015-08-19 DIAGNOSIS — M545 Low back pain, unspecified: Secondary | ICD-10-CM

## 2015-08-19 DIAGNOSIS — E119 Type 2 diabetes mellitus without complications: Secondary | ICD-10-CM

## 2015-08-19 LAB — HEMOGLOBIN A1C: Hgb A1c MFr Bld: 6.1 % (ref 4.6–6.5)

## 2015-08-19 NOTE — Progress Notes (Signed)
Subjective:    Patient ID: Wayne Melton, male    DOB: 11-22-1970, 44 y.o.   MRN: [id number]  DOS:  08/19/2015 Type of visit - description : Follow-up from previous visit Interval history:  Dyspepsia, workup was negative, saw GI, colonoscopy pending. Still have symptoms on and off Sleep study pending Hypertension: Normal ambulatory BPs. Also complained of back pain for a good while, like a spasm in the lower back, no radiation but sometimes has pain at the left foot. Symptoms worse when he sits for prolonged periods of time.  Review of Systems  Denies chest pain or difficulty breathing No blood in the stools, occasional diarrhea. No nausea.  Past Medical History  Diagnosis Date  . Allergic rhinitis 07/05/2012  . GERD , HH, h/o PUD 07/05/2012  . Hyperlipidemia   . IBS (irritable bowel syndrome) 11/06/2012  . Diabetes mellitus without complication ([place])   . Seasonal allergies   . Obesity     Past Surgical History  Procedure Laterality Date  . Sleep apnea test negative  2014    Social History   Social History  . Marital Status: Married    Spouse Name: N/A  . Number of Children: 3  . Years of Education: N/A   Occupational History  . busines analyst, computers    Social History Main Topics  . Smoking status: Former Smoker -- 0.50 packs/day for 3 years    Types: Cigarettes    Quit date: 10/04/2008  . Smokeless tobacco: Never Used  . Alcohol Use: 0.0 oz/week    0 Standard drinks or equivalent per week     Comment: rare  . Drug Use: No  . Sexual Activity: Not on file   Other Topics Concern  . Not on file   Social History Narrative        Medication List       This list is accurate as of: 08/19/15  6:22 PM.  Always use your most recent med list.               acetaminophen 500 MG tablet  Commonly known as:  TYLENOL  Take 1,000 mg by mouth every 6 (six) hours as needed for moderate pain.     atorvastatin 20 MG tablet  Commonly known as:  LIPITOR    Take 1 tablet (20 mg total) by mouth daily.     Beclomethasone Dipropionate 80 MCG/ACT Aers  Place 2 sprays into the nose daily.     carvedilol 6.25 MG tablet  Commonly known as:  COREG  Take 1 tablet (6.25 mg total) by mouth 2 (two) times daily with a meal.     CLARITIN 10 MG tablet  Generic drug:  loratadine  Take 10 mg by mouth daily.     dicyclomine 10 MG capsule  Commonly known as:  BENTYL  Take 1 capsule (10 mg total) by mouth 3 (three) times daily with meals as needed for spasms.     Fenofibrate 150 MG Caps  Take 1 capsule (150 mg total) by mouth daily.     Na Sulfate-K Sulfate-Mg Sulf Soln  Take as directed per Colonoscopy.     pantoprazole 40 MG tablet  Commonly known as:  PROTONIX  Take 1 tablet (40 mg total) by mouth 2 (two) times daily before a meal.           Objective:   Physical Exam BP 108/78 mmHg  Pulse 63  Temp(Src) 97.8 F (36.6 C) (Oral)  Ht 5'  11" (1.803 m)  Wt 264 lb 6 oz (119.92 kg)  BMI 36.89 kg/m2  SpO2 97% General:   Well developed, well nourished . NAD.  HEENT:  Normocephalic . Face symmetric, atraumatic Lungs:  CTA B Normal respiratory effort, no intercostal retractions, no accessory muscle use. Heart: RRR,  no murmur.  No pretibial edema bilaterally  MSK: No TTP in the back Skin: Not pale. Not jaundice Neurologic:  alert & oriented X3.  Speech normal, gait appropriate for age and unassisted. Motor symmetric Psych--  Cognition and judgment appear intact.  Cooperative with normal attention span and concentration.  Behavior appropriate. No anxious or depressed appearing.      Assessment & Plan:   Assessment> Hyperlipidemia Diabetes GI: --History of PUD (EGD @ [facility]  2002: neg  but reports had another EGD with ulcer) --IBS --GERD Other: - neg home sleep apnea test 2014, to have another in house test ~08-2015  - neg stress test 03/2015  PLAN Dyspepsia: Ultrasound / labs negative, saw GI, to have a colonoscopy soon.  . DM: Check A1c. Diet and exercise discussed Back pain: On and off back pain, no radicular symptoms, discuss referral to PT versus self learning with a video.  Prefers self-learnining, see AVS. Primary care: flu shot today. RTC: CPX 02-2016

## 2015-08-19 NOTE — Progress Notes (Signed)
Pre visit review using our clinic review tool, if applicable. No additional management support is needed unless otherwise documented below in the visit note. 

## 2015-08-19 NOTE — Patient Instructions (Signed)
Get your blood work before you leave    [employer] with information about home physical therapy for back pain: [email or web address]      Next visit  for a   physical exam by May 2017, fasting  Please schedule an appointment at the front desk

## 2015-08-19 NOTE — Assessment & Plan Note (Signed)
Dyspepsia: Ultrasound / labs negative, saw GI, to have a colonoscopy soon. . DM: Check A1c. Diet and exercise discussed Back pain: On and off back pain, no radicular symptoms, discuss referral to PT versus self learning with a video.  Prefers self-learnining, see AVS. Primary care: flu shot today. RTC: CPX 02-2016

## 2015-08-31 ENCOUNTER — Ambulatory Visit (HOSPITAL_BASED_OUTPATIENT_CLINIC_OR_DEPARTMENT_OTHER): Payer: BLUE CROSS/BLUE SHIELD | Attending: Pulmonary Disease

## 2015-08-31 VITALS — Ht 72.0 in | Wt 265.0 lb

## 2015-08-31 DIAGNOSIS — G4733 Obstructive sleep apnea (adult) (pediatric): Secondary | ICD-10-CM | POA: Insufficient documentation

## 2015-09-11 ENCOUNTER — Telehealth: Payer: Self-pay | Admitting: Pulmonary Disease

## 2015-09-11 DIAGNOSIS — G4733 Obstructive sleep apnea (adult) (pediatric): Secondary | ICD-10-CM | POA: Diagnosis not present

## 2015-09-11 NOTE — Telephone Encounter (Signed)
PSG 08/31/15 >> AHI 7.2, SaO2 low 85%.  Will have my nurse inform pt that sleep study shows mild sleep apnea.  Please arrange ROV with me or Tammy Parrett to review treatment options.

## 2015-09-11 NOTE — Progress Notes (Signed)
Patient Name: Wayne Melton, Wayne Melton Date: 08/31/2015 Gender: Male D.O.B: 1971-09-02 Age (years): 9 Referring Provider: Chesley Mires MD, ABSM Height (inches): 72 Interpreting Physician: Chesley Mires MD, ABSM Weight (lbs): 265 RPSGT: Jonna Coup BMI: 36 MRN: [id number] Neck Size: 19.00  CLINICAL INFORMATION Sleep Study Type: NPSG Indication for sleep study: OSA Epworth Sleepiness Score: 13  SLEEP STUDY TECHNIQUE As per the AASM Manual for the Scoring of Sleep and Associated Events v2.3 (April 2016) with a hypopnea requiring 4% desaturations. The channels recorded and monitored were frontal, central and occipital EEG, electrooculogram (EOG), submentalis EMG (chin), nasal and oral airflow, thoracic and abdominal wall motion, anterior tibialis EMG, snore microphone, electrocardiogram, and pulse oximetry.  MEDICATIONS Patient's medications include: reviewed in electronic medical record. Medications self-administered by patient during sleep study : No sleep medicine administered.  SLEEP ARCHITECTURE The study was initiated at 10:42:15 PM and ended at 4:45:44 AM. Sleep onset time was 6.6 minutes and the sleep efficiency was 73.9%. The total sleep time was 268.5 minutes. Stage REM latency was 196.5 minutes. The patient spent 4.10% of the night in stage N1 sleep, 72.63% in stage N2 sleep, 0.00% in stage N3 and 23.28% in REM. Alpha intrusion was absent. Supine sleep was 33.89%.  RESPIRATORY PARAMETERS The overall apnea/hypopnea index (AHI) was 7.2 per hour. There were 0 total apneas, including 0 obstructive, 0 central and 0 mixed apneas. There were 32 hypopneas and 8 RERAs. The AHI during Stage REM sleep was 11.5 per hour. AHI while supine was 9.2 per hour. The mean oxygen saturation was 94.04%. The minimum SpO2 during sleep was 85.00%. Loud snoring was noted during this study.  CARDIAC DATA The 2 lead EKG demonstrated sinus rhythm. The mean heart rate was 67.23 beats per minute.  Other EKG findings include: None.  LEG MOVEMENT DATA The total PLMS were 97 with a resulting PLMS index of 21.68. Associated arousal with leg movement index was 0.4 .  IMPRESSIONS This study showed mild obstructive sleep apnea with an AHI of 7.2 and SaO2 low of 85%.  DIAGNOSIS - Obstructive Sleep Apnea (327.23 [G47.33 ICD-10])  RECOMMENDATIONS Additional therapies include weight loss, CPAP, oral appliance, or surgical assessment.   Chesley Mires, MD, [place], American Board of Sleep Medicine 09/11/2015, 5:26 PM  NPI: [id number]

## 2015-09-15 NOTE — Telephone Encounter (Signed)
LMTCB x 1 

## 2015-09-16 NOTE — Telephone Encounter (Signed)
lmtcb X2 for pt.  

## 2015-09-17 NOTE — Telephone Encounter (Signed)
lmtcb for pt.  

## 2015-09-17 NOTE — Telephone Encounter (Signed)
[phone] calling back

## 2015-09-17 NOTE — Telephone Encounter (Signed)
LMTCB x 3 

## 2015-09-18 NOTE — Telephone Encounter (Signed)
LMTCB for pt 

## 2015-09-19 NOTE — Telephone Encounter (Signed)
Spoke with pt. He is aware of results. OV has been scheduled with TP on 10/14/15 at 12pm. Nothing further was needed.

## 2015-09-26 ENCOUNTER — Emergency Department (HOSPITAL_COMMUNITY)
Admission: EM | Admit: 2015-09-26 | Discharge: 2015-09-26 | Disposition: A | Discharge status: Home / Self Care | Payer: BLUE CROSS/BLUE SHIELD | Source: Home / Self Care | Attending: Family Medicine | Admitting: Family Medicine

## 2015-09-26 ENCOUNTER — Encounter (HOSPITAL_COMMUNITY): Payer: Self-pay | Admitting: Emergency Medicine

## 2015-09-26 DIAGNOSIS — S61011A Laceration without foreign body of right thumb without damage to nail, initial encounter: Secondary | ICD-10-CM | POA: Diagnosis not present

## 2015-09-26 MED ORDER — TETANUS-DIPHTH-ACELL PERTUSSIS 5-2.5-18.5 LF-MCG/0.5 IM SUSP
INTRAMUSCULAR | Status: AC
  Filled 2015-09-26: qty 0.5

## 2015-09-26 MED ORDER — TETANUS-DIPHTH-ACELL PERTUSSIS 5-2.5-18.5 LF-MCG/0.5 IM SUSP
0.5000 mL | Freq: Once | INTRAMUSCULAR | Status: AC
  Administered 2015-09-26: 0.5 mL via INTRAMUSCULAR

## 2015-09-26 NOTE — Discharge Instructions (Signed)
Wash as needed, return if any concerns.

## 2015-09-26 NOTE — ED Notes (Signed)
Soaking wound in betadine and saline

## 2015-09-26 NOTE — ED Provider Notes (Signed)
CSN: [id number]     Arrival date & time 09/26/15  1604 History   First MD Initiated Contact with Patient 09/26/15 1706     Chief Complaint  Patient presents with  . Laceration   (Consider location/radiation/quality/duration/timing/severity/associated sxs/prior Treatment) Patient is a 44 y.o. male presenting with skin laceration. The history is provided by the patient.  Laceration Location:  Finger Finger laceration location:  R thumb Length (cm):  0.3 Depth:  Cutaneous Bleeding: controlled   Time since incident:  1 hour Laceration mechanism:  Knife (cutting plastic and knife slipped with tiny thumb lac.) Pain details:    Severity:  Mild Foreign body present:  No foreign bodies Relieved by:  Pressure Worsened by:  Nothing tried Ineffective treatments:  None tried Tetanus status:  Out of date   Past Medical History  Diagnosis Date  . Allergic rhinitis 07/05/2012  . GERD , HH, h/o PUD 07/05/2012  . Hyperlipidemia   . IBS (irritable bowel syndrome) 11/06/2012  . Diabetes mellitus without complication ([place])   . Seasonal allergies   . Obesity    Past Surgical History  Procedure Laterality Date  . Sleep apnea test negative  2014   Family History  Problem Relation Age of Onset  . Pancreatitis Mother   . Diabetes Mother   . Hyperlipidemia Mother   . CAD Other     GF in his 42s  . Prostate cancer Other     GF in his 44s  . Colon cancer Maternal Uncle     uncle in his 48s  . Colon polyps Mother     late 69s  . Mitral valve prolapse Mother   . Irritable bowel syndrome    . Colitis    . Heart disease     Social History  Substance Use Topics  . Smoking status: Former Smoker -- 0.50 packs/day for 3 years    Types: Cigarettes    Quit date: 10/04/2008  . Smokeless tobacco: Never Used  . Alcohol Use: 0.0 oz/week    0 Standard drinks or equivalent per week     Comment: rare    Review of Systems  Skin: Positive for wound.  All other systems reviewed and are  negative.   Allergies  Review of patient's allergies indicates no known allergies.  Home Medications   Prior to Admission medications   Medication Sig Start Date End Date Taking? Authorizing Provider  acetaminophen (TYLENOL) 500 MG tablet Take 1,000 mg by mouth every 6 (six) hours as needed for moderate pain.    Historical Provider, MD  atorvastatin (LIPITOR) 20 MG tablet Take 1 tablet (20 mg total) by mouth daily. 02/11/15   Colon Branch, MD  Beclomethasone Dipropionate 80 MCG/ACT AERS Place 2 sprays into the nose daily. Patient not taking: Reported on 08/19/2015 03/12/15   Colon Branch, MD  carvedilol (COREG) 6.25 MG tablet Take 1 tablet (6.25 mg total) by mouth 2 (two) times daily with a meal. 07/04/15   Colon Branch, MD  dicyclomine (BENTYL) 10 MG capsule Take 1 capsule (10 mg total) by mouth 3 (three) times daily with meals as needed for spasms. 06/11/15   Colon Branch, MD  Fenofibrate 150 MG CAPS Take 1 capsule (150 mg total) by mouth daily. 02/11/15   Colon Branch, MD  loratadine (CLARITIN) 10 MG tablet Take 10 mg by mouth daily.    Historical Provider, MD  Na Sulfate-K Sulfate-Mg Sulf SOLN Take as directed per Colonoscopy. Patient not taking: Reported  on 08/19/2015 07/30/15   Manus Gunning, MD  pantoprazole (PROTONIX) 40 MG tablet Take 1 tablet (40 mg total) by mouth 2 (two) times daily before a meal. 02/11/15   Colon Branch, MD   Meds Ordered and Administered this Visit   Medications  Tdap (BOOSTRIX) injection 0.5 mL (not administered)    BP 123/83 mmHg  Pulse 81  Temp(Src) 97.6 F (36.4 C) (Oral)  SpO2 97% No data found.   Physical Exam  Constitutional: He is oriented to person, place, and time. He appears well-developed and well-nourished.  Musculoskeletal: He exhibits no tenderness.  Superficial nonbleeding lac to prox phalanx of right thumb 95mm., distal nvt intact.  Neurological: He is alert and oriented to person, place, and time.  Skin: Skin is warm and dry.  Nursing  note and vitals reviewed.   ED Course  Procedures (including critical care time)  Labs Review Labs Reviewed - No data to display  Imaging Review No results found.   Visual Acuity Review  Right Eye Distance:   Left Eye Distance:   Bilateral Distance:    Right Eye Near:   Left Eye Near:    Bilateral Near:         MDM   1. Thumb laceration, right, initial encounter        Billy Fischer, MD 09/26/15 1747

## 2015-09-26 NOTE — ED Notes (Signed)
Small laceration to right thumb.  Bleeding controlled.  Last tetanus 2010.  Patient was cutting a piece of plastic.  knife slipped and poked right thumb.

## 2015-10-03 ENCOUNTER — Encounter: Payer: Self-pay | Admitting: Gastroenterology

## 2015-10-09 ENCOUNTER — Encounter: Payer: BLUE CROSS/BLUE SHIELD | Admitting: Gastroenterology

## 2015-10-14 ENCOUNTER — Encounter: Payer: Self-pay | Admitting: Adult Health

## 2015-10-14 ENCOUNTER — Ambulatory Visit (INDEPENDENT_AMBULATORY_CARE_PROVIDER_SITE_OTHER): Payer: BLUE CROSS/BLUE SHIELD | Admitting: Adult Health

## 2015-10-14 VITALS — BP 112/72 | HR 86 | Temp 98.2°F | Ht 72.0 in | Wt 261.0 lb

## 2015-10-14 DIAGNOSIS — G4733 Obstructive sleep apnea (adult) (pediatric): Secondary | ICD-10-CM | POA: Diagnosis not present

## 2015-10-14 NOTE — Patient Instructions (Signed)
Please make appointment with Dr. Ron Parker for evaluation of oral appliance for sleep apnea .  Work on weight loss.  Do not drive if sleepy.  follow up Dr. Halford Chessman  In 6 months and As needed

## 2015-10-14 NOTE — Assessment & Plan Note (Signed)
Mild OSA  Opts to go with oral appliance  encouarged on wt loss  Plan  Please make appointment with Dr. Ron Parker for evaluation of oral appliance for sleep apnea .  Work on weight loss.  Do not drive if sleepy.  follow up Dr. Halford Chessman  In 6 months and As needed

## 2015-10-14 NOTE — Progress Notes (Signed)
Subjective:    Patient ID: Wayne Melton, male    DOB: [birth year], 45 y.o.   MRN: [id number]  HPI 45 yo male seen for sleep consult in July 2016 for daytime sleepiness   TEST  PSG 08/31/15 >> AHI 7.2, SaO2 low 85%.    10/14/2015 Follow up : OSA  Pt returns for 6 month follow up to discuss sleep study and treatment options.  He was set up for sleep study that was done  This showed Mild OSA with AHI at 7.2.  We discussed his test results and treatment options including weight loss, oral appliance and/or CPAP .  He would like to try oral appliance along with wt loss.  He denies chest pain, orthopnea, edema or fever.    Past Medical History  Diagnosis Date  . Allergic rhinitis 07/05/2012  . GERD , HH, h/o PUD 07/05/2012  . Hyperlipidemia   . IBS (irritable bowel syndrome) 11/06/2012  . Diabetes mellitus without complication ([place])   . Seasonal allergies   . Obesity    Current Outpatient Prescriptions on File Prior to Visit  Medication Sig Dispense Refill  . acetaminophen (TYLENOL) 500 MG tablet Take 1,000 mg by mouth every 6 (six) hours as needed for moderate pain.    Marland Kitchen atorvastatin (LIPITOR) 20 MG tablet Take 1 tablet (20 mg total) by mouth daily. 30 tablet 8  . carvedilol (COREG) 6.25 MG tablet Take 1 tablet (6.25 mg total) by mouth 2 (two) times daily with a meal. 60 tablet 5  . Fenofibrate 150 MG CAPS Take 1 capsule (150 mg total) by mouth daily. 30 each 8  . loratadine (CLARITIN) 10 MG tablet Take 10 mg by mouth daily.    . pantoprazole (PROTONIX) 40 MG tablet Take 1 tablet (40 mg total) by mouth 2 (two) times daily before a meal. 60 tablet 8  . Beclomethasone Dipropionate 80 MCG/ACT AERS Place 2 sprays into the nose daily. (Patient not taking: Reported on 10/14/2015) 1 Inhaler 11  . dicyclomine (BENTYL) 10 MG capsule Take 1 capsule (10 mg total) by mouth 3 (three) times daily with meals as needed for spasms. (Patient not taking: Reported on 10/14/2015) 60 capsule 6   No  current facility-administered medications on file prior to visit.     Review of Systems Constitutional:   No  weight loss, night sweats,  Fevers, chills, fatigue, or  lassitude.  HEENT:   No headaches,  Difficulty swallowing,  Tooth/dental problems, or  Sore throat,                No sneezing, itching, ear ache, nasal congestion, post nasal drip,   CV:  No chest pain,  Orthopnea, PND, swelling in lower extremities, anasarca, dizziness, palpitations, syncope.   GI  No heartburn, indigestion, abdominal pain, nausea, vomiting, diarrhea, change in bowel habits, loss of appetite, bloody stools.   Resp: No shortness of breath with exertion or at rest.  No excess mucus, no productive cough,  No non-productive cough,  No coughing up of blood.  No change in color of mucus.  No wheezing.  No chest wall deformity  Skin: no rash or lesions.  GU: no dysuria, change in color of urine, no urgency or frequency.  No flank pain, no hematuria   MS:  No joint pain or swelling.  No decreased range of motion.  No back pain.  Psych:  No change in mood or affect. No depression or anxiety.  No memory loss.  Objective:   Physical Exam Filed Vitals:   10/14/15 1148  BP: 112/72  Pulse: 86  Temp: 98.2 F (36.8 C)  TempSrc: Oral  Height: 6' (1.829 m)  Weight: 261 lb (118.389 kg)  SpO2: 96%   Body mass index is 35.39 kg/(m^2).  GEN: A/Ox3; pleasant , NAD, obese   HEENT:  [place]/AT,  EACs-clear, TMs-wnl, NOSE-clear, THROAT-clear, no lesions, no postnasal drip or exudate noted.   NECK:  Supple w/ fair ROM; no JVD; normal carotid impulses w/o bruits; no thyromegaly or nodules palpated; no lymphadenopathy.  RESP  Clear  P & A; w/o, wheezes/ rales/ or rhonchi.no accessory muscle use, no dullness to percussion  CARD:  RRR, no m/r/g  , no peripheral edema, pulses intact, no cyanosis or clubbing.  GI:   Soft & nt; nml bowel sounds; no organomegaly or masses detected.  Musco: Warm bil, no  deformities or joint swelling noted.   Neuro: alert, no focal deficits noted.    Skin: Warm, no lesions or rashes       Assessment & Plan:

## 2015-10-14 NOTE — Progress Notes (Signed)
Reviewed and agree with assessment/plan. 

## 2015-11-25 ENCOUNTER — Ambulatory Visit (AMBULATORY_SURGERY_CENTER): Payer: Self-pay

## 2015-11-25 VITALS — Ht 72.0 in | Wt 267.2 lb

## 2015-11-25 DIAGNOSIS — R1032 Left lower quadrant pain: Secondary | ICD-10-CM

## 2015-11-25 DIAGNOSIS — Z800 Family history of malignant neoplasm of digestive organs: Secondary | ICD-10-CM

## 2015-11-25 MED ORDER — NA SULFATE-K SULFATE-MG SULF 17.5-3.13-1.6 GM/177ML PO SOLN
ORAL | Status: DC
Start: 2015-11-25 — End: 2015-12-09

## 2015-11-25 NOTE — Progress Notes (Signed)
Per pt, no allergies to soy or egg products.Pt not taking any weight loss meds or using  O2 at home. 

## 2015-12-09 ENCOUNTER — Ambulatory Visit (AMBULATORY_SURGERY_CENTER): Payer: BLUE CROSS/BLUE SHIELD | Admitting: Gastroenterology

## 2015-12-09 ENCOUNTER — Encounter: Payer: Self-pay | Admitting: Gastroenterology

## 2015-12-09 VITALS — BP 106/71 | HR 66 | Temp 98.6°F | Resp 17 | Ht 72.0 in | Wt 267.0 lb

## 2015-12-09 DIAGNOSIS — Z8371 Family history of colonic polyps: Secondary | ICD-10-CM

## 2015-12-09 DIAGNOSIS — R194 Change in bowel habit: Secondary | ICD-10-CM

## 2015-12-09 DIAGNOSIS — R1032 Left lower quadrant pain: Secondary | ICD-10-CM | POA: Diagnosis present

## 2015-12-09 DIAGNOSIS — D123 Benign neoplasm of transverse colon: Secondary | ICD-10-CM | POA: Diagnosis not present

## 2015-12-09 LAB — GLUCOSE, CAPILLARY: GLUCOSE-CAPILLARY: 98 mg/dL (ref 65–99)

## 2015-12-09 MED ORDER — SODIUM CHLORIDE 0.9 % IV SOLN: 500.0000 mL | INTRAVENOUS | Status: DC

## 2015-12-09 NOTE — Progress Notes (Signed)
Called to room to assist during endoscopic procedure.  Patient ID and intended procedure confirmed with present staff. Received instructions for my participation in the procedure from the performing physician.  

## 2015-12-09 NOTE — Patient Instructions (Signed)

## 2015-12-09 NOTE — Progress Notes (Signed)
Patient awakening,vss,report to rn 

## 2015-12-09 NOTE — Op Note (Signed)
[place]  [address]. [place], [id number]   COLONOSCOPY PROCEDURE REPORT  PATIENT: Wayne Melton, Wayne Melton  MR#: [id number] BIRTHDATE: Jul 26, 1971 , 44  yrs. old GENDER: male ENDOSCOPIST: Yetta Flock, MD REFERRED BY: Kathlene November MD PROCEDURE DATE:  12/09/2015 PROCEDURE:   Colonoscopy, diagnostic and Colonoscopy with snare polypectomy First Screening Colonoscopy - Avg.  risk and is 50 yrs.  old or older Yes.  Prior Negative Screening - Now for repeat screening. N/A  History of Adenoma - Now for follow-up colonoscopy & has been > or = to 3 yrs.  N/A  Polyps removed today? Yes ASA CLASS:   Class II INDICATIONS:strong FH of colon polyps (mother age 65s) and uncle with CRC, change in bowel habits, LLQ pain. MEDICATIONS: Propofol 350 mg IV  DESCRIPTION OF PROCEDURE:   After the risks benefits and alternatives of the procedure were thoroughly explained, informed consent was obtained.  The digital rectal exam revealed no abnormalities of the rectum.   The LB [id number]  endoscope was introduced through the anus and advanced to the terminal ileum which was intubated for a short distance. No adverse events experienced.   The quality of the prep was adequate  The instrument was then slowly withdrawn as the colon was fully examined. Estimated blood loss is zero unless otherwise noted in this procedure report.    COLON FINDINGS: A 43mm sessile polyp was noted in the transverse colon, and removed with cold snare.  The remainder of the examined colon was normal without other polyps or inflammatory changes.  The ileum was normal.  Retroflexed views revealed internal hemorrhoids. The time to cecum = 1.5 Withdrawal time = 9.6   The scope was withdrawn and the procedure completed. COMPLICATIONS: There were no immediate complications.  ENDOSCOPIC IMPRESSION: One small colon polyp removed with cold snare Normal remainder of examined colon and ileum Internal  hemorrhoids  RECOMMENDATIONS: Await pathology results Resume diet Resume medications  eSigned:  Yetta Flock, MD 12/09/2015 11:41 AM   cc: Kathlene November MD, the patient

## 2015-12-10 ENCOUNTER — Telehealth: Payer: Self-pay | Admitting: Emergency Medicine

## 2015-12-10 NOTE — Telephone Encounter (Signed)
Left message, no identifier present, f/u 

## 2015-12-14 ENCOUNTER — Other Ambulatory Visit: Payer: Self-pay | Admitting: Internal Medicine

## 2015-12-16 ENCOUNTER — Encounter: Payer: Self-pay | Admitting: Gastroenterology

## 2016-01-18 ENCOUNTER — Other Ambulatory Visit: Payer: Self-pay | Admitting: Internal Medicine

## 2016-02-19 ENCOUNTER — Encounter: Payer: BLUE CROSS/BLUE SHIELD | Admitting: Internal Medicine

## 2016-04-29 ENCOUNTER — Other Ambulatory Visit: Payer: Self-pay | Admitting: Internal Medicine

## 2016-05-30 ENCOUNTER — Other Ambulatory Visit: Payer: Self-pay | Admitting: Internal Medicine

## 2016-06-17 ENCOUNTER — Ambulatory Visit (INDEPENDENT_AMBULATORY_CARE_PROVIDER_SITE_OTHER): Payer: BLUE CROSS/BLUE SHIELD | Admitting: Internal Medicine

## 2016-06-17 ENCOUNTER — Encounter: Payer: Self-pay | Admitting: Internal Medicine

## 2016-06-17 VITALS — BP 122/74 | HR 53 | Temp 97.6°F | Resp 14 | Ht 72.0 in | Wt 272.4 lb

## 2016-06-17 DIAGNOSIS — Z0000 Encounter for general adult medical examination without abnormal findings: Secondary | ICD-10-CM | POA: Diagnosis not present

## 2016-06-17 DIAGNOSIS — E119 Type 2 diabetes mellitus without complications: Secondary | ICD-10-CM | POA: Diagnosis not present

## 2016-06-17 DIAGNOSIS — Z23 Encounter for immunization: Secondary | ICD-10-CM

## 2016-06-17 DIAGNOSIS — M549 Dorsalgia, unspecified: Secondary | ICD-10-CM

## 2016-06-17 LAB — MICROALBUMIN / CREATININE URINE RATIO
Creatinine,U: 67.4 mg/dL
MICROALB/CREAT RATIO: 1 mg/g (ref 0.0–30.0)

## 2016-06-17 LAB — COMPREHENSIVE METABOLIC PANEL
ALBUMIN: 4.2 g/dL (ref 3.5–5.2)
ALT: 42 U/L (ref 0–53)
AST: 12 U/L (ref 0–37)
Alkaline Phosphatase: 66 U/L (ref 39–117)
BUN: 12 mg/dL (ref 6–23)
CHLORIDE: 107 meq/L (ref 96–112)
CO2: 29 mEq/L (ref 19–32)
CREATININE: 1.08 mg/dL (ref 0.40–1.50)
Calcium: 9.1 mg/dL (ref 8.4–10.5)
GFR: 79.33 mL/min (ref >60.00)
GLUCOSE: 101 mg/dL — AB (ref 70–99)
POTASSIUM: 4.3 meq/L (ref 3.5–5.1)
SODIUM: 139 meq/L (ref 135–145)
TOTAL PROTEIN: 6.9 g/dL (ref 6.0–8.3)
Total Bilirubin: 0.5 mg/dL (ref 0.2–1.2)

## 2016-06-17 LAB — CBC WITH DIFFERENTIAL/PLATELET
BASOS ABS: 0 10*3/uL (ref 0.0–0.1)
BASOS PCT: 0.6 % (ref 0.0–3.0)
EOS ABS: 0.1 10*3/uL (ref 0.0–0.7)
Eosinophils Relative: 1.2 % (ref 0.0–5.0)
HEMATOCRIT: 40.5 % (ref 39.0–52.0)
HEMOGLOBIN: 13.7 g/dL (ref 13.0–17.0)
LYMPHS PCT: 35 % (ref 12.0–46.0)
Lymphs Abs: 2.3 10*3/uL (ref 0.7–4.0)
MCHC: 34 g/dL (ref 30.0–36.0)
MCV: 89 fl (ref 78.0–100.0)
MONO ABS: 0.5 10*3/uL (ref 0.1–1.0)
Monocytes Relative: 6.9 % (ref 3.0–12.0)
Neutro Abs: 3.7 10*3/uL (ref 1.4–7.7)
Neutrophils Relative %: 56.3 % (ref 43.0–77.0)
Platelets: 341 10*3/uL (ref 150.0–400.0)
RBC: 4.55 Mil/uL (ref 4.22–5.81)
RDW: 12.7 % (ref 11.5–15.5)
WBC: 6.7 10*3/uL (ref 4.0–10.5)

## 2016-06-17 LAB — LIPID PANEL
CHOL/HDL RATIO: 5
CHOLESTEROL: 153 mg/dL (ref 0–200)
HDL: 28.8 mg/dL — ABNORMAL LOW (ref >39.00)
LDL CALC: 92 mg/dL (ref 0–99)
NONHDL: 124.11
Triglycerides: 162 mg/dL — ABNORMAL HIGH (ref 0.0–149.0)
VLDL: 32.4 mg/dL (ref 0.0–40.0)

## 2016-06-17 LAB — HEMOGLOBIN A1C: HEMOGLOBIN A1C: 7 % — AB (ref 4.6–6.5)

## 2016-06-17 NOTE — Progress Notes (Signed)
Subjective:    Patient ID: Wayne Melton, male    DOB: 1971/01/15, 45 y.o.   MRN: [id number]  DOS:  06/17/2016 Type of visit - description : cpx Interval history: Also discuss his chronic medical problems. Continue with GI symptoms on and off Continue with back pain, see previous visit, did not do self physical therapy   Review of Systems  Constitutional: No fever. No chills. No unexplained wt changes. No unusual sweats  HEENT: No dental problems, no ear discharge, no facial swelling, no voice changes. No eye discharge, no eye  redness , no  intolerance to light   Respiratory: No wheezing , no  difficulty breathing. No cough , no mucus production  Cardiovascular: No CP, no leg swelling , no  Palpitations  GI: Ongoing dyspepsia, saw GI 07-2015, Bentyl did not help.    Endocrine: No polyphagia, no polyuria , no polydipsia  GU: No dysuria, gross hematuria, difficulty urinating. No urinary urgency, no frequency.  Musculoskeletal: Ongoing on and off back pain, not severe  Skin: No change in the color of the skin, palor , no  Rash  Allergic, immunologic: No environmental allergies , no  food allergies  Neurological: No dizziness no  syncope. No headaches. No diplopia, no slurred, no slurred speech, no motor deficits, no facial  Numbness  Hematological: No enlarged lymph nodes, no easy bruising , no unusual bleedings  Psychiatry: No suicidal ideas, no hallucinations, no beavior problems, no confusion.  No unusual/severe anxiety, no depression   Past Medical History:  Diagnosis Date  . Allergic rhinitis 07/05/2012  . Constipation    chronic  . Diabetes mellitus without complication ([facility])    No meds  . GERD , HH, h/o PUD 07/05/2012  . Hyperlipidemia   . IBS (irritable bowel syndrome) 11/06/2012  . Obesity   . OSA (obstructive sleep apnea)    mild  . Seasonal allergies     History reviewed. No pertinent surgical history.  Social History   Social History  .  Marital status: Married    Spouse name: N/A  . Number of children: 3  . Years of education: N/A   Occupational History  . busines analyst, computers    Social History Main Topics  . Smoking status: Former Smoker    Packs/day: 0.50    Years: 3.00    Types: Cigarettes    Quit date: 10/04/2008  . Smokeless tobacco: Never Used  . Alcohol use No     Comment: rare  . Drug use: No  . Sexual activity: Not on file   Other Topics Concern  . Not on file   Social History Narrative   Household: pt, wife, 3 children   Family History  Problem Relation Age of Onset  . Pancreatitis Mother   . Diabetes Mother   . Hyperlipidemia Mother   . Colon polyps Mother     late 73s  . Mitral valve prolapse Mother   . Colon cancer Maternal Uncle     uncle in his 38s  . Epilepsy Father   . Colon cancer Maternal Grandfather   . CAD Other     GF in his 61s  . Prostate cancer Other     GF in his 44s  . Irritable bowel syndrome    . Colitis    . Heart disease          Medication List       Accurate as of 06/17/16  7:46 PM. Always use your most  recent med list.          acetaminophen 500 MG tablet Commonly known as:  TYLENOL Take 1,000 mg by mouth every 6 (six) hours as needed for moderate pain.   aspirin EC 81 MG tablet Take 81 mg by mouth daily.   atorvastatin 20 MG tablet Commonly known as:  LIPITOR Take 1 tablet (20 mg total) by mouth daily.   Beclomethasone Dipropionate 80 MCG/ACT Aers Place 2 sprays into the nose daily.   carvedilol 6.25 MG tablet Commonly known as:  COREG Take 1 tablet (6.25 mg total) by mouth 2 (two) times daily with a meal.   CLARITIN 10 MG tablet Generic drug:  loratadine Take 10 mg by mouth as needed.   Fenofibrate 150 MG Caps Take 1 capsule (150 mg total) by mouth daily.   pantoprazole 40 MG tablet Commonly known as:  PROTONIX Take 1 tablet (40 mg total) by mouth 2 (two) times daily before a meal.          Objective:   Physical Exam BP  122/74 (BP Location: Right Arm, Patient Position: Sitting, Cuff Size: Normal)   Pulse (!) 53   Temp 97.6 F (36.4 C) (Oral)   Resp 14   Ht 6' (1.829 m)   Wt 272 lb 6 oz (123.5 kg)   SpO2 99%   BMI 36.94 kg/m   General:   Well developed, well nourished . NAD.  Neck: No  thyromegaly  HEENT:  Normocephalic . Face symmetric, atraumatic Lungs:  CTA B Normal respiratory effort, no intercostal retractions, no accessory muscle use. Heart: RRR,  no murmur.  No pretibial edema bilaterally  Abdomen:  Not distended, soft, non-tender. No rebound or rigidity.   DIABETIC FEET EXAM: No lower extremity edema Normal pedal pulses bilaterally Skin normal, nails normal, no calluses Pinprick examination of the feet normal. Neurologic:  alert & oriented X3.  Speech normal, gait appropriate for age and unassisted Strength symmetric and appropriate for age.  Psych: Cognition and judgment appear intact.  Cooperative with normal attention span and concentration.  Behavior appropriate. No anxious or depressed appearing.    Assessment & Plan:   Assessment DM Hyperlipidemia GI: --History of PUD (EGD @ [facility]  2002: neg  but reports had another EGD with ulcer) --IBS --GERD -- fatty liver per u/s 06-2015  OSA: (-)  home sleep apnea test 2014,  Full sleep study~08-2015 : mild OSA, saw pulmonary, rx dental appliance, wt loss  - neg stress test 03/2015  PLAN DM: Diet control, check A1c-micro. Encourage weight loss, calorie counting? Feet exam negative Hyperlipidemia: Continue Lipitor, check labs Dyspepsia: Per GI Back pain : Ongoing, refer to PT. Also needs immunization advise, going to [employer], recommend to schedule office visit to discuss. RTC 4 months

## 2016-06-17 NOTE — Assessment & Plan Note (Signed)
DM: Diet control, check A1c-micro. Encourage weight loss, calorie counting? Feet exam negative Hyperlipidemia: Continue Lipitor, check labs Dyspepsia: Per GI Back pain : Ongoing, refer to PT. Also needs immunization advise, going to [employer] [place], recommend to schedule office visit to discuss. RTC 4 months

## 2016-06-17 NOTE — Assessment & Plan Note (Addendum)
Td 2010;  pnm shot --2016; prevnar 2017 Flu shot today   CCS: +FH   colon polyps (mother, early 37s ) and colon cancer ( uncle, mid 80s) cscope 12-2015 , next per GI   Diet and exercise discussed, also rec to start ASA Labs: CMP, FLP, CBC, A1c, micral

## 2016-06-17 NOTE — Progress Notes (Signed)
Pre visit review using our clinic review tool, if applicable. No additional management support is needed unless otherwise documented below in the visit note. 

## 2016-06-17 NOTE — Patient Instructions (Signed)
GO TO THE LAB : Get the blood work     GO TO THE FRONT DESK Schedule your next appointment for a  routine checkup in 4 months  Start aspirin 81 mg every day, with food.  Consider calorie counting, MYFITNESSPAL  start a walking program, at least 30 minutes daily

## 2016-07-30 ENCOUNTER — Other Ambulatory Visit: Payer: Self-pay | Admitting: Internal Medicine

## 2016-10-18 ENCOUNTER — Encounter: Payer: Self-pay | Admitting: Internal Medicine

## 2016-10-18 ENCOUNTER — Ambulatory Visit (INDEPENDENT_AMBULATORY_CARE_PROVIDER_SITE_OTHER): Payer: BLUE CROSS/BLUE SHIELD | Admitting: Internal Medicine

## 2016-10-18 VITALS — BP 124/76 | HR 72 | Temp 98.1°F | Resp 14 | Ht 72.0 in | Wt 252.1 lb

## 2016-10-18 DIAGNOSIS — E785 Hyperlipidemia, unspecified: Secondary | ICD-10-CM | POA: Diagnosis not present

## 2016-10-18 DIAGNOSIS — E119 Type 2 diabetes mellitus without complications: Secondary | ICD-10-CM | POA: Diagnosis not present

## 2016-10-18 LAB — HEMOGLOBIN A1C: Hgb A1c MFr Bld: 6.1 % (ref 4.6–6.5)

## 2016-10-18 NOTE — Progress Notes (Signed)
Pre visit review using our clinic review tool, if applicable. No additional management support is needed unless otherwise documented below in the visit note. 

## 2016-10-18 NOTE — Progress Notes (Signed)
Subjective:    Patient ID: Wayne Melton, male    DOB: Mar 24, 1971, 46 y.o.   MRN: [id number]  DOS:  10/18/2016 Type of visit - description : rov Interval history: No major concerns, doing well with diet, has lost weight.  Wt Readings from Last 3 Encounters:  10/18/16 252 lb 2 oz (114.4 kg)  06/17/16 272 lb 6 oz (123.5 kg)  12/09/15 267 lb (121.1 kg)     Review of Systems  Denies chest pain or difficulty breathing No nausea, vomiting, diarrhea. No blood in the stools. Does have other type of IBS symptoms such as constipation  Past Medical History:  Diagnosis Date  . Allergic rhinitis 07/05/2012  . Constipation    chronic  . Diabetes mellitus without complication (HCC)    No meds  . GERD , HH, h/o PUD 07/05/2012  . Hyperlipidemia   . IBS (irritable bowel syndrome) 11/06/2012  . Obesity   . OSA (obstructive sleep apnea)    mild  . Seasonal allergies     History reviewed. No pertinent surgical history.  Social History   Social History  . Marital status: Married    Spouse name: N/A  . Number of children: 3  . Years of education: N/A   Occupational History  . busines analyst, computers    Social History Main Topics  . Smoking status: Former Smoker    Packs/day: 0.50    Years: 3.00    Types: Cigarettes    Quit date: 10/04/2008  . Smokeless tobacco: Never Used  . Alcohol use No     Comment: rare  . Drug use: No  . Sexual activity: Not on file   Other Topics Concern  . Not on file   Social History Narrative   Household: pt, wife, 3 children      Allergies as of 10/18/2016   No Known Allergies     Medication List       Accurate as of 10/18/16 11:59 PM. Always use your most recent med list.          acetaminophen 500 MG tablet Commonly known as:  TYLENOL Take 1,000 mg by mouth every 6 (six) hours as needed for moderate pain.   aspirin EC 81 MG tablet Take 81 mg by mouth daily.   atorvastatin 20 MG tablet Commonly known as:  LIPITOR Take 1  tablet (20 mg total) by mouth daily.   Beclomethasone Dipropionate 80 MCG/ACT Aers Place 2 sprays into the nose daily.   CLARITIN 10 MG tablet Generic drug:  loratadine Take 10 mg by mouth as needed.   Fenofibrate 150 MG Caps Take 1 capsule (150 mg total) by mouth daily.   pantoprazole 40 MG tablet Commonly known as:  PROTONIX Take 1 tablet (40 mg total) by mouth 2 (two) times daily before a meal.          Objective:   Physical Exam BP 124/76 (BP Location: Left Arm, Patient Position: Sitting, Cuff Size: Normal)   Pulse 72   Temp 98.1 F (36.7 C) (Oral)   Resp 14   Ht 6' (1.829 m)   Wt 252 lb 2 oz (114.4 kg)   SpO2 98%   BMI 34.19 kg/m  General:   Well developed, well nourished . NAD.  HEENT:  Normocephalic . Face symmetric, atraumatic Lungs:  CTA B Normal respiratory effort, no intercostal retractions, no accessory muscle use. Heart: RRR,  no murmur.  No pretibial edema bilaterally  Skin: Not pale. Not jaundice  Neurologic:  alert & oriented X3.  Speech normal, gait appropriate for age and unassisted Psych--  Cognition and judgment appear intact.  Cooperative with normal attention span and concentration.  Behavior appropriate. No anxious or depressed appearing.     Assessment & Plan:   Assessment DM Hyperlipidemia GI: --History of PUD (EGD @ [facility]  2002: neg  but reports had another EGD with ulcer) --IBS --GERD -- fatty liver per u/s 06-2015  OSA: (-)  home sleep apnea test 2014,  Full sleep study~08-2015 : mild OSA, saw pulmonary, rx dental appliance, wt loss  - neg stress test 03/2015  PLAN DM: Diet control, doing great, has lost some weight, check A1c. Consider medication if A1c trending up. High chol-- Last FLP satisfactory, continue fenofibrate Stop carvedilol: Was rx BB a while back, BP was slightly elevated at the time, patient thinks was related to stress. He is doing better now, will stop carvedilol and monitor BPs. RTC 6 months, CPX

## 2016-10-18 NOTE — Patient Instructions (Addendum)
GO TO THE LAB : Get the blood work     GO TO THE FRONT DESK Schedule your next appointment for a  Physical in 6 months   Stop coreg   Check the  blood pressure 2 or 3 times a month   Be sure your blood pressure is between 110/65 and  135/85. If it is consistently higher or lower, let me know

## 2016-10-19 NOTE — Assessment & Plan Note (Signed)
DM: Diet control, doing great, has lost some weight, check A1c. Consider medication if A1c trending up. High chol-- Last FLP satisfactory, continue fenofibrate Stop carvedilol: Was rx BB a while back, BP was slightly elevated at the time, patient thinks was related to stress. He is doing better now, will stop carvedilol and monitor BPs. RTC 6 months, CPX

## 2016-12-05 ENCOUNTER — Other Ambulatory Visit: Payer: Self-pay | Admitting: Internal Medicine

## 2016-12-08 ENCOUNTER — Telehealth: Payer: Self-pay

## 2016-12-08 MED ORDER — FENOFIBRATE 145 MG PO TABS: 145.0000 mg | ORAL_TABLET | Freq: Every day | ORAL | 8 refills | Status: DC

## 2016-12-08 NOTE — Telephone Encounter (Signed)
[facility] pharmacy needing medication alternative for Fenofibrate 150mg , per PCP okay to send Fenofibrate 145mg . Rx sent.

## 2017-06-04 LAB — HM DIABETES EYE EXAM

## 2017-06-17 ENCOUNTER — Encounter: Payer: Self-pay | Admitting: Internal Medicine

## 2017-06-22 ENCOUNTER — Ambulatory Visit (INDEPENDENT_AMBULATORY_CARE_PROVIDER_SITE_OTHER): Payer: BLUE CROSS/BLUE SHIELD | Admitting: Internal Medicine

## 2017-06-22 ENCOUNTER — Encounter: Payer: Self-pay | Admitting: Internal Medicine

## 2017-06-22 VITALS — BP 130/76 | HR 63 | Temp 97.7°F | Resp 14 | Ht 72.0 in | Wt 260.0 lb

## 2017-06-22 DIAGNOSIS — Z23 Encounter for immunization: Secondary | ICD-10-CM | POA: Diagnosis not present

## 2017-06-22 DIAGNOSIS — K219 Gastro-esophageal reflux disease without esophagitis: Secondary | ICD-10-CM

## 2017-06-22 DIAGNOSIS — Z0000 Encounter for general adult medical examination without abnormal findings: Secondary | ICD-10-CM | POA: Diagnosis not present

## 2017-06-22 DIAGNOSIS — E119 Type 2 diabetes mellitus without complications: Secondary | ICD-10-CM | POA: Diagnosis not present

## 2017-06-22 LAB — COMPREHENSIVE METABOLIC PANEL
ALK PHOS: 62 U/L (ref 39–117)
ALT: 42 U/L (ref 0–53)
AST: 16 U/L (ref 0–37)
Albumin: 4.4 g/dL (ref 3.5–5.2)
BILIRUBIN TOTAL: 0.6 mg/dL (ref 0.2–1.2)
BUN: 12 mg/dL (ref 6–23)
CO2: 27 meq/L (ref 19–32)
CREATININE: 1.27 mg/dL (ref 0.40–1.50)
Calcium: 9.6 mg/dL (ref 8.4–10.5)
Chloride: 106 mEq/L (ref 96–112)
GFR: 65.49 mL/min (ref >60.00)
GLUCOSE: 90 mg/dL (ref 70–99)
Potassium: 3.9 mEq/L (ref 3.5–5.1)
Sodium: 140 mEq/L (ref 135–145)
TOTAL PROTEIN: 7.3 g/dL (ref 6.0–8.3)

## 2017-06-22 LAB — LIPID PANEL
CHOL/HDL RATIO: 4
Cholesterol: 161 mg/dL (ref 0–200)
HDL: 39.4 mg/dL (ref >39.00)
LDL Cholesterol: 87 mg/dL (ref 0–99)
NONHDL: 121.71
Triglycerides: 175 mg/dL — ABNORMAL HIGH (ref 0.0–149.0)
VLDL: 35 mg/dL (ref 0.0–40.0)

## 2017-06-22 LAB — CBC WITH DIFFERENTIAL/PLATELET
BASOS ABS: 0 10*3/uL (ref 0.0–0.1)
Basophils Relative: 0.6 % (ref 0.0–3.0)
EOS PCT: 1.2 % (ref 0.0–5.0)
Eosinophils Absolute: 0.1 10*3/uL (ref 0.0–0.7)
HCT: 42.9 % (ref 39.0–52.0)
HEMOGLOBIN: 14.8 g/dL (ref 13.0–17.0)
LYMPHS ABS: 2.1 10*3/uL (ref 0.7–4.0)
Lymphocytes Relative: 34.9 % (ref 12.0–46.0)
MCHC: 34.6 g/dL (ref 30.0–36.0)
MCV: 88.6 fl (ref 78.0–100.0)
MONO ABS: 0.3 10*3/uL (ref 0.1–1.0)
MONOS PCT: 5.8 % (ref 3.0–12.0)
NEUTROS PCT: 57.5 % (ref 43.0–77.0)
Neutro Abs: 3.4 10*3/uL (ref 1.4–7.7)
Platelets: 363 10*3/uL (ref 150.0–400.0)
RBC: 4.84 Mil/uL (ref 4.22–5.81)
RDW: 13 % (ref 11.5–15.5)
WBC: 5.9 10*3/uL (ref 4.0–10.5)

## 2017-06-22 LAB — TSH: TSH: 1.73 u[IU]/mL (ref 0.35–4.50)

## 2017-06-22 LAB — MICROALBUMIN / CREATININE URINE RATIO
CREATININE, U: 252.3 mg/dL
MICROALB/CREAT RATIO: 0.3 mg/g (ref 0.0–30.0)
Microalb, Ur: <0.7 mg/dL (ref 0.0–1.9)

## 2017-06-22 LAB — HEMOGLOBIN A1C: HEMOGLOBIN A1C: 6.5 % (ref 4.6–6.5)

## 2017-06-22 NOTE — Progress Notes (Signed)
Subjective:    Patient ID: Wayne Melton, male    DOB: 04-04-1971, 46 y.o.   MRN: [id number]  DOS:  06/22/2017 Type of visit - description : cpx Interval history: Has some concerns, see ROS   Review of Systems  Had a URI about 6 weeks ago, better except for residual postnasal dripping and some runny nose ; occasional cough. Continue with upper GI symptoms: Having sometimes classic heartburn after eating, sometimes sharp chest pain after food. Pain last 1 or 2 hours. Denies substernal pressure type of symptoms, no exertional chest pain. No dysphagia or odynophagia. No weight loss, blood in the stools.   Other than above, a 14 point review of systems is negative      Past Medical History:  Diagnosis Date  . Allergic rhinitis 07/05/2012  . Constipation    chronic  . Diabetes mellitus without complication (HCC)    No meds  . GERD , HH, h/o PUD 07/05/2012  . Hyperlipidemia   . IBS (irritable bowel syndrome) 11/06/2012  . Obesity   . OSA (obstructive sleep apnea)    mild  . Seasonal allergies     History reviewed. No pertinent surgical history.  Social History   Social History  . Marital status: Married    Spouse name: N/A  . Number of children: 3  . Years of education: N/A   Occupational History  . busines analyst, computers    Social History Main Topics  . Smoking status: Former Smoker    Packs/day: 0.50    Years: 3.00    Types: Cigarettes    Quit date: 10/04/2008  . Smokeless tobacco: Never Used  . Alcohol use No     Comment: rare  . Drug use: No  . Sexual activity: Not on file   Other Topics Concern  . Not on file   Social History Narrative   Household: pt, wife, 3 children     Family History  Problem Relation Age of Onset  . Pancreatitis Mother   . Diabetes Mother   . Hyperlipidemia Mother   . Colon polyps Mother        late 46s  . Mitral valve prolapse Mother   . Colon cancer Maternal Uncle        uncle in his 50s  . Epilepsy Father   .  Colon cancer Maternal Grandfather   . CAD Other        GF in his 90s  . Irritable bowel syndrome Unknown   . Colitis Unknown   . Heart disease Unknown   . Prostate cancer Neg Hx      Allergies as of 06/22/2017   No Known Allergies     Medication List       Accurate as of 06/22/17  6:22 PM. Always use your most recent med list.          acetaminophen 500 MG tablet Commonly known as:  TYLENOL Take 1,000 mg by mouth every 6 (six) hours as needed for moderate pain.   aspirin EC 81 MG tablet Take 81 mg by mouth daily.   atorvastatin 20 MG tablet Commonly known as:  LIPITOR Take 1 tablet (20 mg total) by mouth daily.   Beclomethasone Dipropionate 80 MCG/ACT Aers Place 2 sprays into the nose daily.   cetirizine 10 MG tablet Commonly known as:  ZYRTEC Take 10 mg by mouth daily.   fenofibrate 145 MG tablet Commonly known as:  TRICOR Take 1 tablet (145 mg total) by  mouth daily.   pantoprazole 40 MG tablet Commonly known as:  PROTONIX Take 1 tablet (40 mg total) by mouth 2 (two) times daily before a meal.            Discharge Care Instructions        Start     Ordered   06/22/17 0000  Comp Met (CMET)     06/22/17 0948   06/22/17 0000  Lipid panel     06/22/17 0948   06/22/17 0000  CBC w/Diff     06/22/17 0948   06/22/17 0000  Hemoglobin A1c     06/22/17 0948   06/22/17 0000  Urine Microalbumin w/creat. ratio     06/22/17 0948   06/22/17 0000  TSH     06/22/17 0948   06/22/17 0000  Flu Vaccine QUAD 6+ mos PF IM (Fluarix Quad PF)     06/22/17 0948   06/22/17 0000  Ambulatory referral to Gastroenterology    Question:  What is the reason for referral?  Answer:  Other  Comment:  GERD   06/22/17 0949         Objective:   Physical Exam BP 130/76 (BP Location: Left Arm, Patient Position: Sitting, Cuff Size: Normal)   Pulse 63   Temp 97.7 F (36.5 C) (Oral)   Resp 14   Ht 6' (1.829 m)   Wt 260 lb (117.9 kg)   SpO2 98%   BMI 35.26 kg/m   General:     Well developed, well nourished . NAD.  Neck: No  thyromegaly  HEENT:  Normocephalic . Face symmetric, atraumatic Lungs:  CTA B Normal respiratory effort, no intercostal retractions, no accessory muscle use. Heart: RRR,  no murmur.  No pretibial edema bilaterally  Abdomen:  Not distended, soft, non-tender. No rebound or rigidity.   Skin: Exposed areas without rash. Not pale. Not jaundice Neurologic:  alert & oriented X3.  Speech normal, gait appropriate for age and unassisted Strength symmetric and appropriate for age.  Psych: Cognition and judgment appear intact.  Cooperative with normal attention span and concentration.  Behavior appropriate. No anxious or depressed appearing.    Assessment & Plan:   Assessment DM Hyperlipidemia GI: --History of PUD (EGD @ [facility]  2002: neg  but reports had another EGD with ulcer) --IBS, occ constipation --GERD -- fatty liver per u/s 06-2015  OSA: (-)  home sleep apnea test 2014,  Full sleep study~08-2015 : mild OSA, saw pulmonary, rx dental appliance, wt loss  - neg stress test 03/2015  PLAN DM: Diet control, check A1c and micro Hyperlipidemia: On atorvastatin and fenofibrate. Checking labs GERD: Ongoing symptoms -see ROS- despite taking PPIs twice a day on an empty stomach; has been working on GERD precautions. Would like further eval. Refer to GI. Remind him about precautions. RTC 6 months

## 2017-06-22 NOTE — Progress Notes (Signed)
Pre visit review using our clinic review tool, if applicable. No additional management support is needed unless otherwise documented below in the visit note. 

## 2017-06-22 NOTE — Assessment & Plan Note (Signed)
DM: Diet control, check A1c and micro Hyperlipidemia: On atorvastatin and fenofibrate. Checking labs GERD: Ongoing symptoms -see ROS- despite taking PPIs twice a day on an empty stomach; has been working on GERD precautions. Would like further eval. Refer to GI. Remind him about precautions. RTC 6 months

## 2017-06-22 NOTE — Patient Instructions (Signed)
GO TO THE LAB : Get the blood work     GO TO THE FRONT DESK Schedule your next appointment for a  checkup in 6 months  Please visit the American diabetes Association for diet information   Food Choices for Gastroesophageal Reflux Disease, Adult When you have gastroesophageal reflux disease (GERD), the foods you eat and your eating habits are very important. Choosing the right foods can help ease the discomfort of GERD. Consider working with a diet and nutrition specialist (dietitian) to help you make healthy food choices. What general guidelines should I follow? Eating plan  Choose healthy foods low in fat, such as fruits, vegetables, whole grains, low-fat dairy products, and lean meat, fish, and poultry.  Eat frequent, small meals instead of three large meals each day. Eat your meals slowly, in a relaxed setting. Avoid bending over or lying down until 2-3 hours after eating.  Limit high-fat foods such as fatty meats or fried foods.  Limit your intake of oils, butter, and shortening to less than 8 teaspoons each day.  Avoid the following: ? Foods that cause symptoms. These may be different for different people. Keep a food diary to keep track of foods that cause symptoms. ? Alcohol. ? Drinking large amounts of liquid with meals. ? Eating meals during the 2-3 hours before bed.  Cook foods using methods other than frying. This may include baking, grilling, or broiling. Lifestyle   Maintain a healthy weight. Ask your health care provider what weight is healthy for you. If you need to lose weight, work with your health care provider to do so safely.  Exercise for at least 30 minutes on 5 or more days each week, or as told by your health care provider.  Avoid wearing clothes that fit tightly around your waist and chest.  Do not use any products that contain nicotine or tobacco, such as cigarettes and e-cigarettes. If you need help quitting, ask your health care provider.  Sleep  with the head of your bed raised. Use a wedge under the mattress or blocks under the bed frame to raise the head of the bed. What foods are not recommended? The items listed may not be a complete list. Talk with your dietitian about what dietary choices are best for you. Grains Pastries or quick breads with added fat. [place] toast. Vegetables Deep fried vegetables. [place] fries. Any vegetables prepared with added fat. Any vegetables that cause symptoms. For some people this may include tomatoes and tomato products, chili peppers, onions and garlic, and horseradish. Fruits Any fruits prepared with added fat. Any fruits that cause symptoms. For some people this may include citrus fruits, such as oranges, grapefruit, pineapple, and lemons. Meats and other protein foods High-fat meats, such as fatty beef or pork, hot dogs, ribs, ham, sausage, salami and bacon. Fried meat or protein, including fried fish and fried chicken. Nuts and nut butters. Dairy Whole milk and chocolate milk. Sour cream. Cream. Ice cream. Cream cheese. Milk shakes. Beverages Coffee and tea, with or without caffeine. Carbonated beverages. Sodas. Energy drinks. Fruit juice made with acidic fruits (such as orange or grapefruit). Tomato juice. Alcoholic drinks. Fats and oils Butter. Margarine. Shortening. Ghee. Sweets and desserts Chocolate and cocoa. Donuts. Seasoning and other foods Pepper. Peppermint and spearmint. Any condiments, herbs, or seasonings that cause symptoms. For some people, this may include curry, hot sauce, or vinegar-based salad dressings. Summary  When you have gastroesophageal reflux disease (GERD), food and lifestyle choices are very important  to help ease the discomfort of GERD.  Eat frequent, small meals instead of three large meals each day. Eat your meals slowly, in a relaxed setting. Avoid bending over or lying down until 2-3 hours after eating.  Limit high-fat foods such as fatty meat or fried  foods. This information is not intended to replace advice given to you by your health care provider. Make sure you discuss any questions you have with your health care provider. Document Released: 09/20/2005 Document Revised: 09/21/2016 Document Reviewed: 09/21/2016 Elsevier Interactive Patient Education  2017 [employer].

## 2017-06-22 NOTE — Assessment & Plan Note (Signed)
-  Td 2010;  pnm shot: 2016; prevnar 2017;  Flu shot today -CCS: +FH   colon polyps (mother, early 17s ) and colon cancer ( uncle, mid 74s) cscope 12-2015 , next per GI  -Diet and exercise discussed  -Labs:  CMP, FLP, CBC, A1c, microalbumin, TSH

## 2017-08-10 ENCOUNTER — Encounter: Payer: Self-pay | Admitting: Gastroenterology

## 2017-08-10 ENCOUNTER — Ambulatory Visit: Payer: BLUE CROSS/BLUE SHIELD | Admitting: Gastroenterology

## 2017-08-10 VITALS — BP 118/72 | HR 68 | Ht 72.0 in | Wt 256.0 lb

## 2017-08-10 DIAGNOSIS — Z6834 Body mass index (BMI) 34.0-34.9, adult: Secondary | ICD-10-CM | POA: Diagnosis not present

## 2017-08-10 DIAGNOSIS — K219 Gastro-esophageal reflux disease without esophagitis: Secondary | ICD-10-CM | POA: Diagnosis not present

## 2017-08-10 DIAGNOSIS — E669 Obesity, unspecified: Secondary | ICD-10-CM

## 2017-08-10 DIAGNOSIS — R1013 Epigastric pain: Secondary | ICD-10-CM

## 2017-08-10 MED ORDER — AMBULATORY NON FORMULARY MEDICATION: 0 refills | Status: DC

## 2017-08-10 NOTE — Progress Notes (Signed)
HPI :  46 year old man here for a follow-up visit for suspected symptoms of reflux and dyspepsia.   At his last visit he had a colonoscopy for his family history of polyps. He was noted to have one small adenoma.  His main issue today is what he thinks are symptoms of reflux. He endorses significant bloating after he eats associated with dyspepsia and discomfort in his upper abdomen. Certain foods in particular greasy spicy foods and pasta precipitate symptoms, which can last for hours after he eats. He has these symptoms about 2 times per week which make it difficult for him to sleep. He is on Protonix twice a day and does think it helps him, as he will feel worse if he misses a dose. He denies much heartburn at all and this regimen but he does have some regurgitation which bothers him. He denies any family history of esophageal cancer. He denies any routine dysphagia. He's had a prior EGD in 2002 and possibly in 2007, which he thinks are normal. There is no records of an exam in 2007.  Otherwise is been followed for fatty liver. Had normal liver enzymes. He denies any alcohol use. States his weight is stable since last visit.  Colonoscopy 12/09/15 - 24mm transverse TA, otherwise normal exam -   H/o fatty liver - last LFTs normal in Sept   Past Medical History:  Diagnosis Date  . Allergic rhinitis 07/05/2012  . Constipation    chronic  . Diabetes mellitus without complication (HCC)    No meds  . GERD , HH, h/o PUD 07/05/2012  . Hyperlipidemia   . IBS (irritable bowel syndrome) 11/06/2012  . Obesity   . OSA (obstructive sleep apnea)    mild  . Seasonal allergies      History reviewed. No pertinent surgical history. Family History  Problem Relation Age of Onset  . Pancreatitis Mother   . Diabetes Mother   . Hyperlipidemia Mother   . Colon polyps Mother        late 10s  . Mitral valve prolapse Mother   . Colon cancer Maternal Uncle        uncle in his 39s  . Epilepsy Father   .  Colon cancer Maternal Grandfather   . CAD Other        GF in his 58s  . Irritable bowel syndrome Unknown   . Colitis Unknown   . Heart disease Unknown   . Prostate cancer Neg Hx    Social History   Tobacco Use  . Smoking status: Former Smoker    Packs/day: 0.50    Years: 3.00    Pack years: 1.50    Types: Cigarettes    Last attempt to quit: 10/04/2008    Years since quitting: 8.8  . Smokeless tobacco: Never Used  Substance Use Topics  . Alcohol use: No    Alcohol/week: 0.0 oz    Comment: rare  . Drug use: No   Current Outpatient Medications  Medication Sig Dispense Refill  . acetaminophen (TYLENOL) 500 MG tablet Take 1,000 mg by mouth every 6 (six) hours as needed for moderate pain.    Marland Kitchen aspirin EC 81 MG tablet Take 81 mg by mouth daily.    Marland Kitchen atorvastatin (LIPITOR) 20 MG tablet Take 1 tablet (20 mg total) by mouth daily. 30 tablet 8  . Beclomethasone Dipropionate 80 MCG/ACT AERS Place 2 sprays into the nose daily. 1 Inhaler 11  . cetirizine (ZYRTEC) 10 MG tablet Take  10 mg by mouth daily.    . fenofibrate (TRICOR) 145 MG tablet Take 1 tablet (145 mg total) by mouth daily. 30 tablet 8  . pantoprazole (PROTONIX) 40 MG tablet Take 1 tablet (40 mg total) by mouth 2 (two) times daily before a meal. 60 tablet 8   No current facility-administered medications for this visit.    No Known Allergies   Review of Systems: All systems reviewed and negative except where noted in HPI.   Lab Results  Component Value Date   WBC 5.9 06/22/2017   HGB 14.8 06/22/2017   HCT 42.9 06/22/2017   MCV 88.6 06/22/2017   PLT 363.0 06/22/2017    Lab Results  Component Value Date   CREATININE 1.27 06/22/2017   BUN 12 06/22/2017   NA 140 06/22/2017   K 3.9 06/22/2017   CL 106 06/22/2017   CO2 27 06/22/2017    Lab Results  Component Value Date   ALT 42 06/22/2017   AST 16 06/22/2017   ALKPHOS 62 06/22/2017   BILITOT 0.6 06/22/2017     Physical Exam: BP 118/72   Pulse 68   Ht 6'  (1.829 m)   Wt 256 lb (116.1 kg)   BMI 34.72 kg/m  Constitutional: Pleasant,well-developed, male in no acute distress. HEENT: Normocephalic and atraumatic. Conjunctivae are normal. No scleral icterus. Neck supple.  Cardiovascular: Normal rate, regular rhythm.  Pulmonary/chest: Effort normal and breath sounds normal. No wheezing, rales or rhonchi. Abdominal: Soft, protuberant, nontender. Bowel sounds active throughout. There are no masses palpable. No hepatomegaly. Extremities: no edema Lymphadenopathy: No cervical adenopathy noted. Neurological: Alert and oriented to person place and time. Skin: Skin is warm and dry. No rashes noted. Psychiatric: Normal mood and affect. Behavior is normal.   ASSESSMENT AND PLAN: 46 year old male here for follow-up for GERD, also endorses symptoms of dyspepsia.  It appears that his more typical reflux symptoms of heartburn is controlled however he continues to have regurgitation. His postprandial symptoms seem more consistent with dyspepsia. He's had a prior ultrasound in 2016 showing no evidence of gallstones.   I think an EGD is reasonable given his long-standing reflux and his upper abdominal symptoms, rule out large hiatal hernia, rule out peptic ulcer disease/H. Pylori. Following discussion of EGD and anesthesia he wanted to proceed with the procedure. In the interim he will continue his Protonix given its control his heartburn well, and then he will also add a trial of FD Donald Prose to see if this helps his dyspepsia. I was able to give him a sample of FD Donald Prose as well as provide coupons. He asked about further testing of his gallbladder, specifically HIDA scan. He does not have classic biliary colic symptoms but will consider it if his upper abdominal symptoms persist despite treatment and EGD does not show clear etiology. Of note we did discuss his weight for a bit, I think his reflux will be better controlled if he was able to lose some weight, he agreed and  will work on this.   He agreed with the plan.  Casselton Cellar, MD [facility] Pager [phone]  CC: Colon Branch, MD

## 2017-08-10 NOTE — Patient Instructions (Signed)
If you are age 44 or older, your body mass index should be between 23-30. Your Body mass index is 34.72 kg/m. If this is out of the aforementioned range listed, please consider follow up with your Primary Care Provider.  If you are age 22 or younger, your body mass index should be between 19-25. Your Body mass index is 34.72 kg/m. If this is out of the aformentioned range listed, please consider follow up with your Primary Care Provider.    You have been scheduled for an endoscopy. Please follow written instructions given to you at your visit today. If you use inhalers (even only as needed), please bring them with you on the day of your procedure. Your physician has requested that you go to www.startemmi.com and enter the access code given to you at your visit today. This web site gives a general overview about your procedure. However, you should still follow specific instructions given to you by our office regarding your preparation for the procedure.  We have given you samples of FDgard today. Take 1 to 2 before meals as needed.  Please continue taking Protonix twice a day.  Thank you.

## 2017-08-16 ENCOUNTER — Ambulatory Visit (AMBULATORY_SURGERY_CENTER): Payer: BLUE CROSS/BLUE SHIELD | Admitting: Gastroenterology

## 2017-08-16 ENCOUNTER — Encounter: Payer: Self-pay | Admitting: Gastroenterology

## 2017-08-16 VITALS — BP 118/62 | HR 69 | Temp 97.8°F | Resp 14 | Ht 72.0 in | Wt 256.0 lb

## 2017-08-16 DIAGNOSIS — K228 Other specified diseases of esophagus: Secondary | ICD-10-CM | POA: Diagnosis not present

## 2017-08-16 DIAGNOSIS — K219 Gastro-esophageal reflux disease without esophagitis: Secondary | ICD-10-CM | POA: Diagnosis not present

## 2017-08-16 DIAGNOSIS — K2950 Unspecified chronic gastritis without bleeding: Secondary | ICD-10-CM | POA: Diagnosis not present

## 2017-08-16 DIAGNOSIS — R1013 Epigastric pain: Secondary | ICD-10-CM

## 2017-08-16 DIAGNOSIS — K2970 Gastritis, unspecified, without bleeding: Secondary | ICD-10-CM | POA: Diagnosis not present

## 2017-08-16 DIAGNOSIS — K209 Esophagitis, unspecified: Secondary | ICD-10-CM

## 2017-08-16 DIAGNOSIS — K317 Polyp of stomach and duodenum: Secondary | ICD-10-CM | POA: Diagnosis not present

## 2017-08-16 MED ORDER — DEXLANSOPRAZOLE 60 MG PO CPDR: 60.0000 mg | DELAYED_RELEASE_CAPSULE | Freq: Every day | ORAL | 1 refills | Status: DC

## 2017-08-16 MED ORDER — SODIUM CHLORIDE 0.9 % IV SOLN: 500.0000 mL | INTRAVENOUS | Status: DC

## 2017-08-16 NOTE — Progress Notes (Signed)
TO PACU  Pt awake and alert. Report to RN 

## 2017-08-16 NOTE — Op Note (Signed)
[place] Patient Name: Wayne Melton Procedure Date: 08/16/2017 1:20 PM MRN: [id number] Endoscopist: Remo Lipps P. Nylan Nakatani MD, MD Age: 46 Referring MD:  Date of Birth: 10-12-1970 Gender: Male Account #: [id number] Procedure:                Upper GI endoscopy Indications:              Dyspepsia, Heartburn despite high dose protonix                            40mg  twice daily Medicines:                Monitored Anesthesia Care Procedure:                Pre-Anesthesia Assessment:                           - Prior to the procedure, a History and Physical                            was performed, and patient medications and                            allergies were reviewed. The patient's tolerance of                            previous anesthesia was also reviewed. The risks                            and benefits of the procedure and the sedation                            options and risks were discussed with the patient.                            All questions were answered, and informed consent                            was obtained. Prior Anticoagulants: The patient has                            taken no previous anticoagulant or antiplatelet                            agents. ASA Grade Assessment: III - A patient with                            severe systemic disease. After reviewing the risks                            and benefits, the patient was deemed in                            satisfactory condition to undergo the procedure.  After obtaining informed consent, the endoscope was                            passed under direct vision. Throughout the                            procedure, the patient's blood pressure, pulse, and                            oxygen saturations were monitored continuously. The                            Endoscope was introduced through the mouth, and                            advanced to the second part of  duodenum. The upper                            GI endoscopy was accomplished without difficulty.                            The patient tolerated the procedure well. Scope In: Scope Out: Findings:                 Esophagogastric landmarks were identified: the                            Z-line was found at 41 cm, the gastroesophageal                            junction was found at 41 cm and the upper extent of                            the gastric folds was found at 41 cm from the                            incisors.                           The exam of the esophagus was otherwise normal.                           Biopsies were taken with a cold forceps in the                            upper third of the esophagus, in the middle third                            of the esophagus and in the lower third of the                            esophagus for histology to rule out eosinophilic  esophagitis.                           A few clean based linear gastric ulcers were found                            in the gastric antrum.                           Patchy moderate inflammation was found at the                            pylorus. Biopsies were taken from the incisura,                            antrum, and body with a cold forceps for                            Helicobacter pylori testing.                           Multiple 3 to 4 mm sessile polyps were found in the                            gastric fundus and in the gastric body. 2                            representative polyps were removed with a cold                            biopsy forceps. Resection and retrieval were                            complete.                           The exam of the stomach was otherwise normal.                           The duodenal bulb and second portion of the                            duodenum were normal. Complications:            No immediate complications.  Estimated blood loss:                            Minimal. Estimated Blood Loss:     Estimated blood loss was minimal. Impression:               - Esophagogastric landmarks identified.                           - Normal esophagus - biopsies obtained to rule out  eosinophilic esophagitis.                           - Linear gastric ulcers.                           - Gastritis. Biopsied to rule out H pylori.                           - Multiple gastric polyps. Suspect benign fundic                            gland polyps. Representative sample resected and                            retrieved.                           - Normal duodenal bulb and second portion of the                            duodenum. Recommendation:           - Patient has a contact number available for                            emergencies. The signs and symptoms of potential                            delayed complications were discussed with the                            patient. Return to normal activities tomorrow.                            Written discharge instructions were provided to the                            patient.                           - Resume previous diet.                           - Continue present medications.                           - Stop protonix, trial of Dexilant 60mg  once daily                           - Avoid NSAIDs other than aspirin                           - Await pathology results. Remo Lipps P. Sebasthian Stailey MD, MD 08/16/2017 1:44:14 PM This report has been signed electronically.

## 2017-08-16 NOTE — Patient Instructions (Signed)
Please see handout on Gastritis. Stop Protonix and start Dexilant 60 mg daily. Avoid NSAIDS except Aspirin.   YOU HAD AN ENDOSCOPIC PROCEDURE TODAY AT [facility]:   Refer to the procedure report that was given to you for any specific questions about what was found during the examination.  If the procedure report does not answer your questions, please call your gastroenterologist to clarify.  If you requested that your care partner not be given the details of your procedure findings, then the procedure report has been included in a sealed envelope for you to review at your convenience later.  YOU SHOULD EXPECT: Some feelings of bloating in the abdomen. Passage of more gas than usual.  Walking can help get rid of the air that was put into your GI tract during the procedure and reduce the bloating. If you had a lower endoscopy (such as a colonoscopy or flexible sigmoidoscopy) you may notice spotting of blood in your stool or on the toilet paper. If you underwent a bowel prep for your procedure, you may not have a normal bowel movement for a few days.  Please Note:  You might notice some irritation and congestion in your nose or some drainage.  This is from the oxygen used during your procedure.  There is no need for concern and it should clear up in a day or so.  SYMPTOMS TO REPORT IMMEDIATELY:    Following upper endoscopy (EGD)  Vomiting of blood or coffee ground material  New chest pain or pain under the shoulder blades  Painful or persistently difficult swallowing  New shortness of breath  Fever of 100F or higher  Black, tarry-looking stools  For urgent or emergent issues, a gastroenterologist can be reached at any hour by calling [phone].   DIET:  We do recommend a small meal at first, but then you may proceed to your regular diet.  Drink plenty of fluids but you should avoid alcoholic beverages for 24 hours.  ACTIVITY:  You should plan to take it easy for the  rest of today and you should NOT DRIVE or use heavy machinery until tomorrow (because of the sedation medicines used during the test).    FOLLOW UP: Our staff will call the number listed on your records the next business day following your procedure to check on you and address any questions or concerns that you may have regarding the information given to you following your procedure. If we do not reach you, we will leave a message.  However, if you are feeling well and you are not experiencing any problems, there is no need to return our call.  We will assume that you have returned to your regular daily activities without incident.  If any biopsies were taken you will be contacted by phone or by letter within the next 1-3 weeks.  Please call us at [phone] if you have not heard about the biopsies in 3 weeks.    SIGNATURES/CONFIDENTIALITY: You and/or your care partner have signed paperwork which will be entered into your electronic medical record.  These signatures attest to the fact that that the information above on your After Visit Summary has been reviewed and is understood.  Full responsibility of the confidentiality of this discharge information lies with you and/or your care-partner.

## 2017-08-16 NOTE — Progress Notes (Signed)
Called to room to assist during endoscopic procedure.  Patient ID and intended procedure confirmed with present staff. Received instructions for my participation in the procedure from the performing physician.  

## 2017-08-17 ENCOUNTER — Telehealth: Payer: Self-pay

## 2017-08-17 NOTE — Telephone Encounter (Signed)
  Follow up Call-  Call back number 08/16/2017 12/09/2015  Post procedure Call Back phone  # [phone] cell.  Permission to leave phone message Yes Yes  Some recent data might be hidden     Patient questions:  Do you have a fever, pain , or abdominal swelling? No. Pain Score  0 *  Have you tolerated food without any problems? Yes.    Have you been able to return to your normal activities? Yes.    Do you have any questions about your discharge instructions: Diet   No. Medications  No. Follow up visit  No.  Do you have questions or concerns about your Care? No.  Actions: * If pain score is 4 or above: No action needed, pain <4.

## 2017-08-17 NOTE — Telephone Encounter (Signed)
  Follow up Call-  Call back number 08/16/2017 12/09/2015  Post procedure Call Back phone  # [phone] cell.  Permission to leave phone message Yes Yes  Some recent data might be hidden     Left message

## 2017-08-18 ENCOUNTER — Other Ambulatory Visit: Payer: Self-pay | Admitting: Internal Medicine

## 2017-08-23 ENCOUNTER — Encounter: Payer: Self-pay | Admitting: Gastroenterology

## 2017-09-10 ENCOUNTER — Other Ambulatory Visit: Payer: Self-pay | Admitting: Internal Medicine

## 2017-09-14 MED ORDER — DEXLANSOPRAZOLE 60 MG PO CPDR: 60.0000 mg | DELAYED_RELEASE_CAPSULE | Freq: Every day | ORAL | 5 refills | Status: DC

## 2017-10-04 ENCOUNTER — Ambulatory Visit (HOSPITAL_COMMUNITY)
Admission: EM | Admit: 2017-10-04 | Discharge: 2017-10-04 | Disposition: A | Discharge status: Home / Self Care | Payer: BLUE CROSS/BLUE SHIELD | Attending: Family Medicine | Admitting: Family Medicine

## 2017-10-04 ENCOUNTER — Other Ambulatory Visit: Payer: Self-pay

## 2017-10-04 ENCOUNTER — Encounter (HOSPITAL_COMMUNITY): Payer: Self-pay | Admitting: *Deleted

## 2017-10-04 DIAGNOSIS — R109 Unspecified abdominal pain: Secondary | ICD-10-CM

## 2017-10-04 DIAGNOSIS — R112 Nausea with vomiting, unspecified: Secondary | ICD-10-CM

## 2017-10-04 MED ORDER — ONDANSETRON 4 MG PO TBDP: 4.0000 mg | ORAL_TABLET | Freq: Three times a day (TID) | ORAL | 0 refills | Status: DC | PRN

## 2017-10-04 NOTE — Discharge Instructions (Signed)
Please return here or to the Emergency Department immediately should you feel worse in any way or have any of the following symptoms: increasing or different abdominal pain, persistent vomiting, fevers, or shaking chills. ° ° °

## 2017-10-04 NOTE — ED Triage Notes (Signed)
cramping in lower abd, bloating in upper abd, bloating causing him to get nauseous, fatigue,

## 2017-10-05 NOTE — ED Provider Notes (Signed)
[name]   [id number] 10/04/17 Arrival Time: 1517  ASSESSMENT & PLAN:  1. Abdominal cramping   2. Non-intractable vomiting with nausea, unspecified vomiting type     Meds ordered this encounter  Medications  . ondansetron (ZOFRAN-ODT) 4 MG disintegrating tablet    Sig: Take 1 tablet (4 mg total) by mouth every 8 (eight) hours as needed for nausea or vomiting.    Dispense:  15 tablet    Refill:  0   Unclear etiology of his current symptoms. Benign exam. Discussed gas, constipation, gallbladder, diverticulitis. He is comfortable with 24-48 hour observation. Will plan f/u with his PCP, ED if any acute worsening.  Reviewed expectations re: course of current medical issues. Questions answered. Outlined signs and symptoms indicating need for more acute intervention. Patient verbalized understanding. After Visit Summary given.   SUBJECTIVE:  Wayne Melton is a 47 y.o. male who presents with complaint of intermittent abdominal discomfort. Onset gradual, approximately several days ago. Location: diffusely, but mainly lower abdomen without radiation. Described as pressure-like. Symptoms are gradually improving since beginning. Aggravating factors: eating. Alleviating factors: none. Associated symptoms: bloating. Slight on and off nausea. The patient denies anorexia, arthralgias, belching, chills, dysuria, fever, hematuria and sweats. Appetite: normal. PO intake: normal. Ambulatory without assistance. Urinary symptoms: none. Last bowel movement yesterday without blood. OTC treatment: None. Is being followed by gastroenterology for on and off symptoms similar to what he is describing this evening.   Past Surgical History:  Procedure Laterality Date  . COLONOSCOPY    . UPPER GASTROINTESTINAL ENDOSCOPY      ROS: As per HPI.  OBJECTIVE:  Vitals:   10/04/17 1853  BP: 104/69  Pulse: 74  Temp: 98.3 F (36.8 C)  TempSrc: Oral  SpO2: 98%    General appearance: alert;  no distress Lungs: clear to auscultation bilaterally Heart: regular rate and rhythm Abdomen: soft; non-distended; no tenderness; bowel sounds present; no masses or organomegaly; no guarding or rebound tenderness Back: no CVA tenderness Extremities: no edema; symmetrical with no gross deformities Skin: warm and dry Neurologic: normal gait Psychological: alert and cooperative; normal mood and affect  No Known Allergies                                             Past Medical History:  Diagnosis Date  . Allergic rhinitis 07/05/2012  . Constipation    chronic  . Diabetes mellitus without complication (HCC)    No meds  . GERD , HH, h/o PUD 07/05/2012  . Hyperlipidemia   . IBS (irritable bowel syndrome) 11/06/2012  . Obesity   . OSA (obstructive sleep apnea)    mild  . Seasonal allergies   . Sleep apnea    Social History   Socioeconomic History  . Marital status: Married    Spouse name: Not on file  . Number of children: 3  . Years of education: Not on file  . Highest education level: Not on file  Social Needs  . Financial resource strain: Not on file  . Food insecurity - worry: Not on file  . Food insecurity - inability: Not on file  . Transportation needs - medical: Not on file  . Transportation needs - non-medical: Not on file  Occupational History  . Occupation: busines Administrator, computers  Tobacco Use  . Smoking status: Former Smoker    Packs/day: 0.50  Years: 3.00    Pack years: 1.50    Types: Cigarettes    Last attempt to quit: 10/04/2008    Years since quitting: 9.0  . Smokeless tobacco: Never Used  Substance and Sexual Activity  . Alcohol use: No    Alcohol/week: 0.0 oz    Comment: rare  . Drug use: No  . Sexual activity: Not on file  Other Topics Concern  . Not on file  Social History Narrative   Household: pt, wife, 3 children   Family History  Problem Relation Age of Onset  . Pancreatitis Mother   . Diabetes Mother   . Hyperlipidemia Mother   .  Colon polyps Mother        late 70s  . Mitral valve prolapse Mother   . Colon cancer Maternal Uncle        uncle in his 65s  . Epilepsy Father   . Colon cancer Maternal Grandfather   . CAD Other        GF in his 26s  . Irritable bowel syndrome Unknown   . Colitis Unknown   . Heart disease Unknown   . Prostate cancer Neg Hx      Vanessa Kick, MD 10/05/17 1030

## 2017-10-13 ENCOUNTER — Other Ambulatory Visit: Payer: Self-pay | Admitting: Internal Medicine

## 2017-10-28 DIAGNOSIS — Z713 Dietary counseling and surveillance: Secondary | ICD-10-CM | POA: Diagnosis not present

## 2017-11-20 ENCOUNTER — Other Ambulatory Visit: Payer: Self-pay | Admitting: Internal Medicine

## 2017-11-21 MED ORDER — DEXLANSOPRAZOLE 60 MG PO CPDR: 60.0000 mg | DELAYED_RELEASE_CAPSULE | Freq: Every day | ORAL | 5 refills | Status: DC

## 2017-12-02 DIAGNOSIS — Z713 Dietary counseling and surveillance: Secondary | ICD-10-CM | POA: Diagnosis not present

## 2017-12-07 ENCOUNTER — Encounter: Payer: Self-pay | Admitting: Internal Medicine

## 2017-12-07 ENCOUNTER — Ambulatory Visit: Payer: BLUE CROSS/BLUE SHIELD | Admitting: Internal Medicine

## 2017-12-07 ENCOUNTER — Ambulatory Visit: Payer: Self-pay | Admitting: Internal Medicine

## 2017-12-07 VITALS — BP 128/68 | HR 81 | Temp 97.9°F | Resp 14 | Ht 72.0 in | Wt 250.4 lb

## 2017-12-07 DIAGNOSIS — R05 Cough: Secondary | ICD-10-CM | POA: Diagnosis not present

## 2017-12-07 DIAGNOSIS — R059 Cough, unspecified: Secondary | ICD-10-CM

## 2017-12-07 MED ORDER — AZELASTINE HCL 0.1 % NA SOLN: 2.0000 | Freq: Every evening | NASAL | 3 refills | Status: DC | PRN

## 2017-12-07 MED ORDER — BECLOMETHASONE DIPROP HFA 80 MCG/ACT IN AERB: 2.0000 | INHALATION_SPRAY | Freq: Every day | RESPIRATORY_TRACT | 5 refills | Status: DC

## 2017-12-07 NOTE — Progress Notes (Signed)
Pre visit review using our clinic review tool, if applicable. No additional management support is needed unless otherwise documented below in the visit note. 

## 2017-12-07 NOTE — Progress Notes (Signed)
Subjective:    Patient ID: Wayne Melton, male    DOB: Nov 01, 1970, 47 y.o.   MRN: [id number]  DOS:  12/07/2017 Type of visit - description : acute Interval history: Had a  URI 3 weeks ago: Sinus congestion, cough, thick whitish sputum. At the time he had subjective fever for a few days, some diarrhea.  Some myalgias. Symptoms got gradually better. He took a trip to [place] and came back several days ago. He is here because he has some persistent postnasal dripping and cough.   Review of Systems Denies fever chills at this point No nausea vomiting Heard mild wheezing last week but not now.  Past Medical History:  Diagnosis Date  . Allergic rhinitis 07/05/2012  . Constipation    chronic  . Diabetes mellitus without complication (HCC)    No meds  . GERD , HH, h/o PUD 07/05/2012  . Hyperlipidemia   . IBS (irritable bowel syndrome) 11/06/2012  . Obesity   . OSA (obstructive sleep apnea)    mild  . Seasonal allergies   . Sleep apnea     Past Surgical History:  Procedure Laterality Date  . COLONOSCOPY    . UPPER GASTROINTESTINAL ENDOSCOPY      Social History   Socioeconomic History  . Marital status: Married    Spouse name: Not on file  . Number of children: 3  . Years of education: Not on file  . Highest education level: Not on file  Social Needs  . Financial resource strain: Not on file  . Food insecurity - worry: Not on file  . Food insecurity - inability: Not on file  . Transportation needs - medical: Not on file  . Transportation needs - non-medical: Not on file  Occupational History  . Occupation: busines Administrator, computers  Tobacco Use  . Smoking status: Former Smoker    Packs/day: 0.50    Years: 3.00    Pack years: 1.50    Types: Cigarettes    Last attempt to quit: 10/04/2008    Years since quitting: 9.1  . Smokeless tobacco: Never Used  Substance and Sexual Activity  . Alcohol use: No    Alcohol/week: 0.0 oz    Comment: rare  . Drug use: No  .  Sexual activity: Not on file  Other Topics Concern  . Not on file  Social History Narrative   Household: pt, wife, 3 children      Allergies as of 12/07/2017   No Known Allergies     Medication List        Accurate as of 12/07/17 11:59 PM. Always use your most recent med list.          acetaminophen 500 MG tablet Commonly known as:  TYLENOL Take 1,000 mg by mouth every 6 (six) hours as needed for moderate pain.   AMBULATORY NON FORMULARY MEDICATION Medication Name: FDgard take 1 to 2 before meals as needed   aspirin EC 81 MG tablet Take 81 mg by mouth daily.   atorvastatin 20 MG tablet Commonly known as:  LIPITOR Take 1 tablet (20 mg total) by mouth daily.   azelastine 0.1 % nasal spray Commonly known as:  ASTELIN Place 2 sprays into both nostrils at bedtime as needed for rhinitis. Use in each nostril as directed   beclomethasone 80 MCG/ACT inhaler Commonly known as:  QVAR REDIHALER Inhale 2 puffs into the lungs daily.   cetirizine 10 MG tablet Commonly known as:  ZYRTEC Take 10 mg  by mouth daily.   dexlansoprazole 60 MG capsule Commonly known as:  DEXILANT Take 1 capsule (60 mg total) by mouth daily.   fenofibrate 145 MG tablet Commonly known as:  TRICOR Take 1 tablet (145 mg total) by mouth daily.   ondansetron 4 MG disintegrating tablet Commonly known as:  ZOFRAN-ODT Take 1 tablet (4 mg total) by mouth every 8 (eight) hours as needed for nausea or vomiting.          Objective:   Physical Exam BP 128/68 (BP Location: Left Arm, Patient Position: Sitting, Cuff Size: Normal)   Pulse 81   Temp 97.9 F (36.6 C) (Oral)   Resp 14   Ht 6' (1.829 m)   Wt 250 lb 6 oz (113.6 kg)   SpO2 97%   BMI 33.96 kg/m   General:   Well developed, well nourished . NAD.  HEENT:  Normocephalic . Face symmetric, atraumatic.  TMs bulge bilaterally, worse on the right.  Nose is slightly congested, throat symmetric and not red Lungs:  CTA B.  (Few rhonchi with cough  only) Normal respiratory effort, no intercostal retractions, no accessory muscle use. Heart: RRR,  no murmur.  No pretibial edema bilaterally  Skin: Not pale. Not jaundice Neurologic:  alert & oriented X3.  Speech normal, gait appropriate for age and unassisted Psych--  Cognition and judgment appear intact.  Cooperative with normal attention span and concentration.  Behavior appropriate. No anxious or depressed appearing.      Assessment & Plan:    Assessment DM Hyperlipidemia Bronchospasm (on Qvar) GI: --History of PUD (EGD @ [facility]  2002: neg  but reports had another EGD with ulcer) --IBS, occ constipation --GERD -- fatty liver per u/s 06-2015  OSA: (-)  home sleep apnea test 2014,  Full sleep study~08-2015 : mild OSA, saw pulmonary, rx dental appliance, wt loss  - neg stress test 03/2015  PLAN Cough: Persisting cough after a URI 3 weeks ago, exam is benign except for a few rhonchi.  He has a history of bronchospasm, reports consistent postnasal dripping. Suspect this is a post viral cough and b-spasm. Plan: Restart Qvar, start Flonase and Astelin at night.  Mucinex DM as needed.  Call if not gradually better Has  a follow-up for a CPX in 2 weeks.  Call sooner if no better

## 2017-12-07 NOTE — Patient Instructions (Addendum)
  For cough:  Take Mucinex DM twice a day as needed until better  For nasal congestion: Use OTC   Flonase : 2 nasal sprays on each side of the nose in the morning until you feel better Use ASTELIN a prescribed spray : 2 nasal sprays on each side of the nose at night until you feel better  Restart Qvar daily   Call if not gradually better over the next  10 days  Call anytime if the symptoms are severe

## 2017-12-08 NOTE — Assessment & Plan Note (Signed)
Cough: Persisting cough after a URI 3 weeks ago, exam is benign except for a few rhonchi.  He has a history of bronchospasm, reports consistent postnasal dripping. Suspect this is a post viral cough and b-spasm. Plan: Restart Qvar, start Flonase and Astelin at night.  Mucinex DM as needed.  Call if not gradually better Has  a follow-up for a CPX in 2 weeks.  Call sooner if no better

## 2017-12-21 ENCOUNTER — Ambulatory Visit: Payer: BLUE CROSS/BLUE SHIELD | Admitting: Internal Medicine

## 2017-12-21 ENCOUNTER — Encounter: Payer: Self-pay | Admitting: Internal Medicine

## 2017-12-21 VITALS — BP 126/78 | HR 78 | Temp 97.6°F | Resp 14 | Ht 72.0 in | Wt 245.5 lb

## 2017-12-21 DIAGNOSIS — K219 Gastro-esophageal reflux disease without esophagitis: Secondary | ICD-10-CM | POA: Diagnosis not present

## 2017-12-21 DIAGNOSIS — R197 Diarrhea, unspecified: Secondary | ICD-10-CM | POA: Diagnosis not present

## 2017-12-21 DIAGNOSIS — E119 Type 2 diabetes mellitus without complications: Secondary | ICD-10-CM | POA: Diagnosis not present

## 2017-12-21 LAB — HEMOGLOBIN A1C: HEMOGLOBIN A1C: 5.8 % (ref 4.6–6.5)

## 2017-12-21 NOTE — Patient Instructions (Addendum)
GO TO THE LAB : Get the blood work     GO TO THE FRONT DESK Schedule your next appointment for a  Physical 06-2018  Fluids, bland diet, peptobismol      Diarrhea, Adult Diarrhea is when you have loose and water poop (stool) often. Diarrhea can make you feel weak and cause you to get dehydrated. Dehydration can make you tired and thirsty, make you have a dry mouth, and make it so you pee (urinate) less often. Diarrhea often lasts 2-3 days. However, it can last longer if it is a sign of something more serious. It is important to treat your diarrhea as told by your doctor. Follow these instructions at home: Eating and drinking  Follow these recommendations as told by your doctor:  Take an oral rehydration solution (ORS). This is a drink that is sold at pharmacies and stores.  Drink clear fluids, such as: ? Water. ? Ice chips. ? Diluted fruit juice. ? Low-calorie sports drinks.  Eat bland, easy-to-digest foods in small amounts as you are able. These foods include: ? Bananas. ? Applesauce. ? Rice. ? Low-fat (lean) meats. ? Toast. ? Crackers.  Avoid drinking fluids that have a lot of sugar or caffeine in them.  Avoid alcohol.  Avoid spicy or fatty foods.  General instructions   Drink enough fluid to keep your pee (urine) clear or pale yellow.  Wash your hands often. If you cannot use soap and water, use hand sanitizer.  Make sure that all people in your home wash their hands well and often.  Take over-the-counter and prescription medicines only as told by your doctor.  Rest at home while you get better.  Watch your condition for any changes.  Take a warm bath to help with any burning or pain from having diarrhea.  Keep all follow-up visits as told by your doctor. This is important. Contact a doctor if:  You have a fever.  Your diarrhea gets worse.  You have new symptoms.  You cannot keep fluids down.  You feel light-headed or dizzy.  You have a  headache.  You have muscle cramps. Get help right away if:  You have chest pain.  You feel very weak or you pass out (faint).  You have bloody or black poop or poop that look like tar.  You have very bad pain, cramping, or bloating in your belly (abdomen).  You have trouble breathing or you are breathing very quickly.  Your heart is beating very quickly.  Your skin feels cold and clammy.  You feel confused.  You have signs of dehydration, such as: ? Dark pee, hardly any pee, or no pee. ? Cracked lips. ? Dry mouth. ? Sunken eyes. ? Sleepiness. ? Weakness. This information is not intended to replace advice given to you by your health care provider. Make sure you discuss any questions you have with your health care provider. Document Released: 03/08/2008 Document Revised: 04/09/2016 Document Reviewed: 05/27/2015 Elsevier Interactive Patient Education  2018 [employer].

## 2017-12-21 NOTE — Progress Notes (Signed)
Subjective:    Patient ID: Wayne Melton, male    DOB: 1971-07-11, 47 y.o.   MRN: [id number]  DOS:  12/21/2017 Type of visit - description : rov Interval history: Good compliance with all medications. His main concern today however is diarrhea. Symptoms of started approximately 12/09/2017 with frequent, abundant, watery stools associated with generalized abdominal cramps. Shortly after the symptoms started, he took a trip to [place].  Over there symptoms were ongoing. He came back few days ago and although symptoms are slightly better they are not gone. No recent antibiotics  Wt Readings from Last 3 Encounters:  12/21/17 245 lb 8 oz (111.4 kg)  12/07/17 250 lb 6 oz (113.6 kg)  08/16/17 256 lb (116.1 kg)     Review of Systems Denies fever chills. No nausea, vomiting or blood in the stools.  He does feel slightly achy.  Past Medical History:  Diagnosis Date  . Allergic rhinitis 07/05/2012  . Constipation    chronic  . Diabetes mellitus without complication (HCC)    No meds  . GERD , HH, h/o PUD 07/05/2012  . Hyperlipidemia   . IBS (irritable bowel syndrome) 11/06/2012  . Obesity   . OSA (obstructive sleep apnea)    mild  . Seasonal allergies   . Sleep apnea     Past Surgical History:  Procedure Laterality Date  . COLONOSCOPY    . UPPER GASTROINTESTINAL ENDOSCOPY      Social History   Socioeconomic History  . Marital status: Married    Spouse name: Not on file  . Number of children: 3  . Years of education: Not on file  . Highest education level: Not on file  Social Needs  . Financial resource strain: Not on file  . Food insecurity - worry: Not on file  . Food insecurity - inability: Not on file  . Transportation needs - medical: Not on file  . Transportation needs - non-medical: Not on file  Occupational History  . Occupation: busines Administrator, computers  Tobacco Use  . Smoking status: Former Smoker    Packs/day: 0.50    Years: 3.00    Pack years:  1.50    Types: Cigarettes    Last attempt to quit: 10/04/2008    Years since quitting: 9.2  . Smokeless tobacco: Never Used  Substance and Sexual Activity  . Alcohol use: No    Alcohol/week: 0.0 oz    Comment: rare  . Drug use: No  . Sexual activity: Not on file  Other Topics Concern  . Not on file  Social History Narrative   Household: pt, wife, 3 children      Allergies as of 12/21/2017   No Known Allergies     Medication List        Accurate as of 12/21/17  8:54 AM. Always use your most recent med list.          acetaminophen 500 MG tablet Commonly known as:  TYLENOL Take 1,000 mg by mouth every 6 (six) hours as needed for moderate pain.   AMBULATORY NON FORMULARY MEDICATION Medication Name: FDgard take 1 to 2 before meals as needed   aspirin EC 81 MG tablet Take 81 mg by mouth daily.   atorvastatin 20 MG tablet Commonly known as:  LIPITOR Take 1 tablet (20 mg total) by mouth daily.   azelastine 0.1 % nasal spray Commonly known as:  ASTELIN Place 2 sprays into both nostrils at bedtime as needed for rhinitis.  Use in each nostril as directed   beclomethasone 80 MCG/ACT inhaler Commonly known as:  QVAR REDIHALER Inhale 2 puffs into the lungs daily.   cetirizine 10 MG tablet Commonly known as:  ZYRTEC Take 10 mg by mouth daily.   dexlansoprazole 60 MG capsule Commonly known as:  DEXILANT Take 1 capsule (60 mg total) by mouth daily.   fenofibrate 145 MG tablet Commonly known as:  TRICOR Take 1 tablet (145 mg total) by mouth daily.   ondansetron 4 MG disintegrating tablet Commonly known as:  ZOFRAN-ODT Take 1 tablet (4 mg total) by mouth every 8 (eight) hours as needed for nausea or vomiting.          Objective:   Physical Exam BP 126/78 (BP Location: Left Arm, Patient Position: Sitting, Cuff Size: Normal)   Pulse 78   Temp 97.6 F (36.4 C) (Oral)   Resp 14   Ht 6' (1.829 m)   Wt 245 lb 8 oz (111.4 kg)   SpO2 98%   BMI 33.30 kg/m   General:    Well developed, well nourished . NAD.  HEENT:  Normocephalic . Face symmetric, atraumatic Lungs:  CTA B Normal respiratory effort, no intercostal retractions, no accessory muscle use. Heart: RRR,  no murmur.  no pretibial edema bilaterally  Abdomen:  Not distended, soft, non-tender. No rebound or rigidity.   Skin: Not pale. Not jaundice Neurologic:  alert & oriented X3.  Speech normal, gait appropriate for age and unassisted Psych--  Cognition and judgment appear intact.  Cooperative with normal attention span and concentration.  Behavior appropriate. No anxious or depressed appearing.     Assessment & Plan:   Assessment DM Hyperlipidemia Bronchospasm (on Qvar) GI: --History of PUD (EGD @ [facility]  2002: neg  but reports had another EGD with ulcer) --IBS, occ constipation --GERD -- fatty liver per u/s 06-2015  OSA: (-)  home sleep apnea test 2014,  Full sleep study~08-2015 : mild OSA, saw pulmonary, rx dental appliance, wt loss  - neg stress test 03/2015  PLAN DM: Diet controlled, checking a A1C GERD: Sxs controlled on Dexilant, cost is an issue, patient thinking about changing to another PPI. Acute diarrhea: Symptoms of started 12/09/2017, before his trip to [place] thus not likely travel no diarrhea (I saw few cases of diarrhea this month, viral?).  Symptoms are slowing down, no red flags.  Rx good hydration, Pepto-Bismol, probiotics, bland diet.  Will call if not gradually improving.  Might need stool culture. RTC CPX 06-2018

## 2017-12-21 NOTE — Assessment & Plan Note (Signed)
DM: Diet controlled, checking a A1C GERD: Sxs controlled on Dexilant, cost is an issue, patient thinking about changing to another PPI. Acute diarrhea: Symptoms of started 12/09/2017, before his trip to [place] thus not likely travel no diarrhea (I saw few cases of diarrhea this month, viral?).  Symptoms are slowing down, no red flags.  Rx good hydration, Pepto-Bismol, probiotics, bland diet.  Will call if not gradually improving.  Might need stool culture. RTC CPX 06-2018

## 2017-12-21 NOTE — Progress Notes (Signed)
Pre visit review using our clinic review tool, if applicable. No additional management support is needed unless otherwise documented below in the visit note. 

## 2018-01-11 DIAGNOSIS — Z713 Dietary counseling and surveillance: Secondary | ICD-10-CM | POA: Diagnosis not present

## 2018-02-08 ENCOUNTER — Other Ambulatory Visit: Payer: Self-pay

## 2018-02-08 MED ORDER — BECLOMETHASONE DIPROP HFA 80 MCG/ACT IN AERB: 2.0000 | INHALATION_SPRAY | Freq: Every day | RESPIRATORY_TRACT | 1 refills | Status: DC

## 2018-02-08 MED ORDER — DEXLANSOPRAZOLE 60 MG PO CPDR: 60.0000 mg | DELAYED_RELEASE_CAPSULE | Freq: Every day | ORAL | 1 refills | Status: DC

## 2018-02-14 ENCOUNTER — Telehealth: Payer: Self-pay

## 2018-02-14 NOTE — Telephone Encounter (Signed)
Copied from [place]. Topic: Quick Communication - See Telephone Encounter >> Feb 14, 2018  3:04 PM Conception Chancy, NT wrote: CRM for notification. See Telephone encounter for: 02/14/18.  Anderson Malta is calling from [facility] and would like to let the office know dexlansoprazole (DEXILANT) 60 MG capsule is a not preferred medication. She would like a call back from the nurse to discuss what is preferred to help the patient save some money.   [phone]  Reference # [id number]

## 2018-02-14 NOTE — Telephone Encounter (Signed)
Pt has tried and failed Nexium and pantoprazole.

## 2018-02-14 NOTE — Telephone Encounter (Signed)
Spoke w/ [facility] regarding order- informed that Pt has tried and failed Nexium and pantoprazole, order to keep Dexilant the same. [facility] verbalized understanding- will get medication ready for ship.

## 2018-03-16 ENCOUNTER — Other Ambulatory Visit: Payer: Self-pay

## 2018-03-16 ENCOUNTER — Emergency Department (HOSPITAL_BASED_OUTPATIENT_CLINIC_OR_DEPARTMENT_OTHER)
Admission: EM | Admit: 2018-03-16 | Discharge: 2018-03-16 | Disposition: A | Discharge status: Home / Self Care | Payer: BLUE CROSS/BLUE SHIELD | Attending: Emergency Medicine | Admitting: Emergency Medicine

## 2018-03-16 ENCOUNTER — Encounter (HOSPITAL_BASED_OUTPATIENT_CLINIC_OR_DEPARTMENT_OTHER): Payer: Self-pay | Admitting: Emergency Medicine

## 2018-03-16 ENCOUNTER — Emergency Department (HOSPITAL_BASED_OUTPATIENT_CLINIC_OR_DEPARTMENT_OTHER): Payer: BLUE CROSS/BLUE SHIELD

## 2018-03-16 DIAGNOSIS — Z87891 Personal history of nicotine dependence: Secondary | ICD-10-CM | POA: Insufficient documentation

## 2018-03-16 DIAGNOSIS — R0981 Nasal congestion: Secondary | ICD-10-CM | POA: Diagnosis not present

## 2018-03-16 DIAGNOSIS — H9201 Otalgia, right ear: Secondary | ICD-10-CM | POA: Insufficient documentation

## 2018-03-16 DIAGNOSIS — Z7982 Long term (current) use of aspirin: Secondary | ICD-10-CM | POA: Diagnosis not present

## 2018-03-16 DIAGNOSIS — M542 Cervicalgia: Secondary | ICD-10-CM | POA: Diagnosis not present

## 2018-03-16 DIAGNOSIS — M62838 Other muscle spasm: Secondary | ICD-10-CM | POA: Diagnosis not present

## 2018-03-16 DIAGNOSIS — E119 Type 2 diabetes mellitus without complications: Secondary | ICD-10-CM | POA: Insufficient documentation

## 2018-03-16 DIAGNOSIS — Z79899 Other long term (current) drug therapy: Secondary | ICD-10-CM | POA: Insufficient documentation

## 2018-03-16 DIAGNOSIS — I10 Essential (primary) hypertension: Secondary | ICD-10-CM | POA: Diagnosis not present

## 2018-03-16 MED ORDER — ACETAMINOPHEN 500 MG PO TABS
1000.0000 mg | ORAL_TABLET | Freq: Once | ORAL | Status: AC
  Administered 2018-03-16: 1000 mg via ORAL
  Filled 2018-03-16: qty 2

## 2018-03-16 MED ORDER — METHOCARBAMOL 500 MG PO TABS: 500.0000 mg | ORAL_TABLET | Freq: Two times a day (BID) | ORAL | 0 refills | Status: DC

## 2018-03-16 MED ORDER — OFLOXACIN 0.3 % OT SOLN: 5.0000 [drp] | Freq: Two times a day (BID) | OTIC | 0 refills | Status: DC

## 2018-03-16 MED ORDER — METHOCARBAMOL 500 MG PO TABS
1000.0000 mg | ORAL_TABLET | Freq: Once | ORAL | Status: AC
  Administered 2018-03-16: 1000 mg via ORAL
  Filled 2018-03-16: qty 2

## 2018-03-16 MED ORDER — NAPROXEN 250 MG PO TABS
500.0000 mg | ORAL_TABLET | Freq: Once | ORAL | Status: AC
  Administered 2018-03-16: 500 mg via ORAL
  Filled 2018-03-16: qty 2

## 2018-03-16 MED ORDER — FLUTICASONE PROPIONATE 50 MCG/ACT NA SUSP: 2.0000 | Freq: Every day | NASAL | 0 refills | Status: DC

## 2018-03-16 MED ORDER — NAPROXEN 375 MG PO TABS: 375.0000 mg | ORAL_TABLET | Freq: Two times a day (BID) | ORAL | 0 refills | Status: DC

## 2018-03-16 NOTE — ED Provider Notes (Addendum)
[facility]ENC[facility] DEPARTMENT Provider Note   CSN: [id number] Arrival date & time: 03/16/18  0107     History   Chief Complaint Chief Complaint  Patient presents with  . Otalgia    HPI Wayne Melton is a 47 y.o. male.  The history is provided by the patient.  Otalgia  This is a new problem. The current episode started more than 2 days ago. There is pain in the right ear. The problem occurs constantly. The problem has not changed since onset.There has been no fever. The pain is at a severity of 4/10. The pain is mild. Pertinent negatives include no ear discharge, no headaches, no hearing loss, no rhinorrhea, no sore throat, no abdominal pain, no diarrhea, no vomiting, no cough and no rash. Associated symptoms comments: Congestion, neck feels sore on that side. His past medical history does not include chronic ear infection, hearing loss or tympanostomy tube.  He has had congestion as well and took a benadryl for same.  No weakness nor numbness, no f/c/r. No CP no DOE.  No pain medication taken.  It has been going on for several days.    Past Medical History:  Diagnosis Date  . Allergic rhinitis 07/05/2012  . Constipation    chronic  . Diabetes mellitus without complication (HCC)    No meds  . GERD , HH, h/o PUD 07/05/2012  . Hyperlipidemia   . IBS (irritable bowel syndrome) 11/06/2012  . Obesity   . OSA (obstructive sleep apnea)    mild  . Seasonal allergies   . Sleep apnea     Patient Active Problem List   Diagnosis Date Noted  . OSA (obstructive sleep apnea) 10/14/2015  . Follow-up ---------------PCP NOTES 06/12/2015  . Diabetes mellitus without complication ([place]) 15/17/6160  . Chest pain 02/26/2015  . Fatigue 11/08/2014  . Annual physical exam 11/06/2012  . IBS (irritable bowel syndrome) 11/06/2012  . GERD , HH, h/o PUD 07/05/2012  . Allergic rhinitis 07/05/2012  . Hyperlipemia 07/05/2012    Past Surgical History:  Procedure Laterality Date  .  COLONOSCOPY    . UPPER GASTROINTESTINAL ENDOSCOPY          Home Medications    Prior to Admission medications   Medication Sig Start Date End Date Taking? Authorizing Provider  acetaminophen (TYLENOL) 500 MG tablet Take 1,000 mg by mouth every 6 (six) hours as needed for moderate pain.    [provider]  AMBULATORY NON FORMULARY MEDICATION Medication Name: FDgard take 1 to 2 before meals as needed 08/10/17   Armbruster, Carlota Raspberry, MD  aspirin EC 81 MG tablet Take 81 mg by mouth daily.    [provider]  atorvastatin (LIPITOR) 20 MG tablet Take 1 tablet (20 mg total) by mouth daily. 09/14/17   Colon Branch, MD  azelastine (ASTELIN) 0.1 % nasal spray Place 2 sprays into both nostrils at bedtime as needed for rhinitis. Use in each nostril as directed 12/07/17   Colon Branch, MD  beclomethasone (QVAR REDIHALER) 80 MCG/ACT inhaler Inhale 2 puffs into the lungs daily. 02/08/18   Colon Branch, MD  cetirizine (ZYRTEC) 10 MG tablet Take 10 mg by mouth daily.    [provider]  dexlansoprazole (DEXILANT) 60 MG capsule Take 1 capsule (60 mg total) by mouth daily. 02/08/18   Colon Branch, MD  fenofibrate (TRICOR) 145 MG tablet Take 1 tablet (145 mg total) by mouth daily. 10/13/17   Colon Branch, MD  Family History Family History  Problem Relation Age of Onset  . Pancreatitis Mother   . Diabetes Mother   . Hyperlipidemia Mother   . Colon polyps Mother        late 50s  . Mitral valve prolapse Mother   . Colon cancer Maternal Uncle        uncle in his 31s  . Epilepsy Father   . Colon cancer Maternal Grandfather   . CAD Other        GF in his 24s  . Irritable bowel syndrome Unknown   . Colitis Unknown   . Heart disease Unknown   . Prostate cancer Neg Hx     Social History Social History   Tobacco Use  . Smoking status: Former Smoker    Packs/day: 0.50    Years: 3.00    Pack years: 1.50    Types: Cigarettes    Last attempt to quit: 10/04/2008    Years since  quitting: 9.4  . Smokeless tobacco: Never Used  Substance Use Topics  . Alcohol use: No    Alcohol/week: 0.0 oz    Comment: rare  . Drug use: No     Allergies   Patient has no known allergies.   Review of Systems Review of Systems  Constitutional: Negative for diaphoresis and fever.  HENT: Positive for congestion and ear pain. Negative for ear discharge, hearing loss, rhinorrhea, sinus pain, sneezing, sore throat, tinnitus, trouble swallowing and voice change.   Eyes: Negative for photophobia and visual disturbance.  Respiratory: Negative for cough, choking, chest tightness, shortness of breath and stridor.   Cardiovascular: Negative for chest pain, palpitations and leg swelling.  Gastrointestinal: Negative for abdominal pain, diarrhea and vomiting.  Musculoskeletal: Negative for back pain and neck stiffness.  Skin: Negative for rash.  Neurological: Negative for speech difficulty, weakness, numbness and headaches.  All other systems reviewed and are negative.    Physical Exam Updated Vital Signs BP (!) 139/97 (BP Location: Right Arm)   Pulse 82   Temp 98.2 F (36.8 C) (Oral)   Resp 16   Ht 6' (1.829 m)   Wt 111.1 kg (245 lb)   SpO2 100%   BMI 33.23 kg/m   Physical Exam  Constitutional: He is oriented to person, place, and time. He appears well-developed and well-nourished. No distress.  HENT:  Head: Normocephalic and atraumatic.  Right Ear: No mastoid tenderness. Tympanic membrane is retracted. Tympanic membrane is not injected, not scarred, not perforated, not erythematous and not bulging. No hemotympanum.  Left Ear: No mastoid tenderness. Tympanic membrane is not injected, not scarred, not perforated, not erythematous, not retracted and not bulging. No hemotympanum.  Nose: Nose normal.  Boggy nasal turbinates   Eyes: Pupils are equal, round, and reactive to light. Conjunctivae and EOM are normal.  No proptosis  Neck: Normal range of motion. Neck supple.  Muscle  spasm lateral boarder of the right trapezius.  No bruits no JVD  Cardiovascular: Normal rate, regular rhythm, normal heart sounds and intact distal pulses.  Pulmonary/Chest: Effort normal and breath sounds normal. No stridor. He has no wheezes. He has no rales.  Abdominal: Soft. Bowel sounds are normal. He exhibits no mass. There is no tenderness. There is no rebound and no guarding.  Musculoskeletal: Normal range of motion.  Neurological: He is alert and oriented to person, place, and time. He displays normal reflexes.  Skin: Skin is warm and dry. Capillary refill takes less than 2 seconds.  ED Treatments / Results  Labs (all labs ordered are listed, but only abnormal results are displayed) Labs Reviewed - No data to display  EKG None  Radiology Results for orders placed or performed in visit on 12/21/17  Hemoglobin A1c  Result Value Ref Range   Hgb A1c MFr Bld 5.8 4.6 - 6.5 %   Ct Head Wo Contrast  Result Date: 03/16/2018 CLINICAL DATA:  Right ear pain for 5 days.  Hypertension. EXAM: CT HEAD WITHOUT CONTRAST TECHNIQUE: Contiguous axial images were obtained from the base of the skull through the vertex without intravenous contrast. COMPARISON:  None. FINDINGS: Brain: No intracranial hemorrhage, mass effect, or midline shift. No hydrocephalus. The basilar cisterns are patent. No evidence of territorial infarct or acute ischemia. No extra-axial or intracranial fluid collection. Vascular: No hyperdense vessel or unexpected calcification. Skull: No fracture or focal lesion. Sinuses/Orbits: Insert orbits Other: None. IMPRESSION: No acute intracranial abnormality. Electronically Signed   By: Jeb Levering M.D.   On: 03/16/2018 02:48    Procedures Procedures (including critical care time)  Medications Ordered in ED Medications  naproxen (NAPROSYN) tablet 500 mg (has no administration in time range)  methocarbamol (ROBAXIN) tablet 1,000 mg (has no administration in time range)    acetaminophen (TYLENOL) tablet 1,000 mg (has no administration in time range)       Final Clinical Impressions(s) / ED Diagnoses   Return for weakness, numbness, changes in vision or speech, fevers >100.4 unrelieved by medication, shortness of breath, intractable vomiting, or diarrhea, abdominal pain, Inability to tolerate liquids or food, cough, altered mental status or any concerns. No signs of systemic illness or infection. The patient is nontoxic-appearing on exam and vital signs are within normal limits.   I have reviewed the triage vital signs and the nursing notes. Pertinent labs &imaging results that were available during my care of the patient were reviewed by me and considered in my medical decision making (see chart for details).  After history, exam, and medical workup I feel the patient has been appropriately medically screened and is safe for discharge home. Pertinent diagnoses were discussed with the patient. Patient was given return precautions.    Nike Southwell, MD 03/16/18 [place], Amaree Loisel, MD 03/16/18 [id number]

## 2018-03-16 NOTE — ED Triage Notes (Signed)
Patient presents with complaints of left ear pain and neck tension for 4-5 days; states feeling restless and states BP has been elevated; also co abd pain. NAD noted

## 2018-03-22 DIAGNOSIS — Z713 Dietary counseling and surveillance: Secondary | ICD-10-CM | POA: Diagnosis not present

## 2018-05-18 ENCOUNTER — Encounter: Payer: Self-pay | Admitting: Internal Medicine

## 2018-05-19 ENCOUNTER — Encounter: Payer: Self-pay | Admitting: Family Medicine

## 2018-05-19 ENCOUNTER — Ambulatory Visit: Payer: BLUE CROSS/BLUE SHIELD | Admitting: Family Medicine

## 2018-05-19 VITALS — BP 120/78 | HR 65 | Temp 98.2°F | Ht 72.0 in | Wt 256.2 lb

## 2018-05-19 DIAGNOSIS — H6983 Other specified disorders of Eustachian tube, bilateral: Secondary | ICD-10-CM | POA: Diagnosis not present

## 2018-05-19 DIAGNOSIS — K589 Irritable bowel syndrome without diarrhea: Secondary | ICD-10-CM | POA: Diagnosis not present

## 2018-05-19 MED ORDER — DICYCLOMINE HCL 20 MG PO TABS: 20.0000 mg | ORAL_TABLET | Freq: Three times a day (TID) | ORAL | 0 refills | Status: DC

## 2018-05-19 MED ORDER — METHYLPREDNISOLONE ACETATE 80 MG/ML IJ SUSP
80.0000 mg | Freq: Once | INTRAMUSCULAR | Status: AC
  Administered 2018-05-19: 80 mg via INTRAMUSCULAR

## 2018-05-19 MED ORDER — ONDANSETRON HCL 4 MG PO TABS: 4.0000 mg | ORAL_TABLET | Freq: Three times a day (TID) | ORAL | 0 refills | Status: DC | PRN

## 2018-05-19 NOTE — Patient Instructions (Addendum)
Continue Flonase and your allergy medicine.   Try to mind which foods cause worsening symptoms for your IBS.   Think about a maintenance medicine. This is certainly not mandatory though.   Let us know if you need anything.

## 2018-05-19 NOTE — Progress Notes (Signed)
Chief Complaint  Patient presents with  . Abdominal Pain    for 2 weeks  . Sinusitis  . Ear Pain    Subjective: Patient is a 47 y.o. male here for abd pain.  This has been going on for 2 weeks. Hx of IBS.  He does follow with gastroenterology.  No known trigger.  He is having lower abdominal cramping in addition to flares of nausea and gas in his upper abdominal region.  He does take Dexilant.  In the past, Bentyl and Zofran have been helpful.  He is not on any maintenance medication.  He denies any bleeding, diarrhea, vomiting, fevers, recent travel, or medication change.  He also has a history of allergies and b/l ear pain.  This has been going on for about 3-4 weeks.  There is no drainage from the ears or hearing loss.  He does use Flonase 1 spray each nostril on a daily basis.   ROS: Const: No fevers GI: As noted in HPI  Past Medical History:  Diagnosis Date  . Allergic rhinitis 07/05/2012  . Constipation    chronic  . Diabetes mellitus without complication (HCC)    No meds  . GERD , HH, h/o PUD 07/05/2012  . Hyperlipidemia   . IBS (irritable bowel syndrome) 11/06/2012  . Obesity   . OSA (obstructive sleep apnea)    mild  . Seasonal allergies   . Sleep apnea     Objective: BP 120/78 (BP Location: Left Arm, Patient Position: Sitting, Cuff Size: Large)   Pulse 65   Temp 98.2 F (36.8 C) (Oral)   Ht 6' (1.829 m)   Wt 256 lb 4 oz (116.2 kg)   SpO2 98%   BMI 34.75 kg/m  General: Awake, appears stated age HEENT: MMM, EOMi, ears neg, deviated septum to L Heart: RRR, no murmurs Lungs: CTAB, no rales, wheezes or rhonchi. No accessory muscle use Abd: BS+, soft, there is 1 focal area of tenderness in the right side of the abdomen, negative Carnetts, McBurney's, Rovsing's, Murphy's Psych: Age appropriate judgment and insight, normal affect and mood  Assessment and Plan: Irritable bowel syndrome, unspecified type - Plan: dicyclomine (BENTYL) 20 MG tablet, ondansetron  (ZOFRAN) 4 MG tablet  Dysfunction of both eustachian tubes - Plan: methylPREDNISolone acetate (DEPO-MEDROL) injection 80 mg  Orders as above.  We discussed starting him daily maintenance medicine for his IBS which he would like to hold off on for now. Continue allergy medication and nasal spray.  If this does not work I would consider referral to ENT. Follow-up with regular PCP as originally scheduled. The patient voiced understanding and agreement to the plan.  [place], DO 05/19/18  10:58 AM

## 2018-05-19 NOTE — Progress Notes (Signed)
Pre visit review using our clinic review tool, if applicable. No additional management support is needed unless otherwise documented below in the visit note. 

## 2018-05-26 ENCOUNTER — Telehealth: Payer: Self-pay | Admitting: Internal Medicine

## 2018-05-26 ENCOUNTER — Other Ambulatory Visit: Payer: Self-pay | Admitting: *Deleted

## 2018-05-26 MED ORDER — DEXLANSOPRAZOLE 60 MG PO CPDR: 60.0000 mg | DELAYED_RELEASE_CAPSULE | Freq: Every day | ORAL | 0 refills | Status: DC

## 2018-05-26 NOTE — Telephone Encounter (Signed)
Copied from [place] [id number]. Topic: Quick Communication - Rx Refill/Question >> May 26, 2018  8:24 AM Margot Ables wrote: Medication: dexlansoprazole (DEXILANT) 60 MG capsule  - pt didn't order his medication from mail order in time and is wanting to get a 90 day supply sent to local pharmacy for him because he is out  Has the patient contacted their pharmacy? Yes - needs new RX  Preferred Pharmacy (with phone number or street name): [id number] - [place], [place] - [address] AT [address] [phone] (Phone) [phone] (Fax)

## 2018-05-26 NOTE — Telephone Encounter (Signed)
Remaining refill sent to local pharmacy for patient pick up.

## 2018-06-19 LAB — HM DIABETES EYE EXAM

## 2018-06-21 ENCOUNTER — Encounter: Payer: Self-pay | Admitting: Internal Medicine

## 2018-06-23 ENCOUNTER — Ambulatory Visit (INDEPENDENT_AMBULATORY_CARE_PROVIDER_SITE_OTHER): Payer: BLUE CROSS/BLUE SHIELD | Admitting: Internal Medicine

## 2018-06-23 ENCOUNTER — Encounter: Payer: Self-pay | Admitting: Internal Medicine

## 2018-06-23 VITALS — BP 128/70 | HR 69 | Temp 98.0°F | Resp 16 | Ht 72.0 in | Wt 255.4 lb

## 2018-06-23 DIAGNOSIS — E119 Type 2 diabetes mellitus without complications: Secondary | ICD-10-CM | POA: Diagnosis not present

## 2018-06-23 DIAGNOSIS — Z0000 Encounter for general adult medical examination without abnormal findings: Secondary | ICD-10-CM

## 2018-06-23 DIAGNOSIS — G4733 Obstructive sleep apnea (adult) (pediatric): Secondary | ICD-10-CM

## 2018-06-23 DIAGNOSIS — Z23 Encounter for immunization: Secondary | ICD-10-CM | POA: Diagnosis not present

## 2018-06-23 DIAGNOSIS — J302 Other seasonal allergic rhinitis: Secondary | ICD-10-CM

## 2018-06-23 LAB — LIPID PANEL
Cholesterol: 292 mg/dL — ABNORMAL HIGH (ref 0–200)
HDL: 36.7 mg/dL — ABNORMAL LOW (ref >39.00)
NONHDL: 255.17
Total CHOL/HDL Ratio: 8
Triglycerides: 254 mg/dL — ABNORMAL HIGH (ref 0.0–149.0)
VLDL: 50.8 mg/dL — ABNORMAL HIGH (ref 0.0–40.0)

## 2018-06-23 LAB — COMPREHENSIVE METABOLIC PANEL
ALT: 28 U/L (ref 0–53)
AST: 9 U/L (ref 0–37)
Albumin: 4.4 g/dL (ref 3.5–5.2)
Alkaline Phosphatase: 84 U/L (ref 39–117)
BUN: 11 mg/dL (ref 6–23)
CALCIUM: 10 mg/dL (ref 8.4–10.5)
CO2: 33 mEq/L — ABNORMAL HIGH (ref 19–32)
CREATININE: 0.97 mg/dL (ref 0.40–1.50)
Chloride: 102 mEq/L (ref 96–112)
GFR: 88.97 mL/min (ref >60.00)
GLUCOSE: 90 mg/dL (ref 70–99)
Potassium: 4.7 mEq/L (ref 3.5–5.1)
Sodium: 140 mEq/L (ref 135–145)
Total Bilirubin: 0.5 mg/dL (ref 0.2–1.2)
Total Protein: 7.1 g/dL (ref 6.0–8.3)

## 2018-06-23 LAB — CBC WITH DIFFERENTIAL/PLATELET
Basophils Absolute: 0 10*3/uL (ref 0.0–0.1)
Basophils Relative: 0.6 % (ref 0.0–3.0)
EOS PCT: 0.7 % (ref 0.0–5.0)
Eosinophils Absolute: 0.1 10*3/uL (ref 0.0–0.7)
HCT: 46.6 % (ref 39.0–52.0)
HEMOGLOBIN: 15.8 g/dL (ref 13.0–17.0)
Lymphocytes Relative: 26.8 % (ref 12.0–46.0)
Lymphs Abs: 2.1 10*3/uL (ref 0.7–4.0)
MCHC: 34 g/dL (ref 30.0–36.0)
MCV: 88 fl (ref 78.0–100.0)
MONO ABS: 0.5 10*3/uL (ref 0.1–1.0)
MONOS PCT: 6.9 % (ref 3.0–12.0)
Neutro Abs: 5.1 10*3/uL (ref 1.4–7.7)
Neutrophils Relative %: 65 % (ref 43.0–77.0)
Platelets: 335 10*3/uL (ref 150.0–400.0)
RBC: 5.29 Mil/uL (ref 4.22–5.81)
RDW: 13.1 % (ref 11.5–15.5)
WBC: 7.8 10*3/uL (ref 4.0–10.5)

## 2018-06-23 LAB — MICROALBUMIN / CREATININE URINE RATIO
CREATININE, U: 39.8 mg/dL
MICROALB/CREAT RATIO: 1.8 mg/g (ref 0.0–30.0)

## 2018-06-23 LAB — HEMOGLOBIN A1C: Hgb A1c MFr Bld: 6.3 % (ref 4.6–6.5)

## 2018-06-23 LAB — LDL CHOLESTEROL, DIRECT: LDL DIRECT: 197 mg/dL

## 2018-06-23 NOTE — Progress Notes (Signed)
Subjective:    Patient ID: Wayne Melton, male    DOB: 1970-11-18, 47 y.o.   MRN: [id number]  DOS:  06/23/2018 Type of visit - description : cpx Interval history: Here for a CPX   Review of Systems IBS symptoms controlled. Has a lot of respiratory allergies, nose congestion, not able to sleep well. Not taking cholesterol medication. Occasionally has left shoulder pain with some radiation down to the muscles of the neck arm forearm.  No neck pain per se, no paresthesias.  Other than above, a 14 point review of systems is negative      Past Medical History:  Diagnosis Date  . Allergic rhinitis 07/05/2012  . Constipation    chronic  . Diabetes mellitus without complication (HCC)    No meds  . GERD , HH, h/o PUD 07/05/2012  . Hyperlipidemia   . IBS (irritable bowel syndrome) 11/06/2012  . Obesity   . OSA (obstructive sleep apnea)    mild  . Seasonal allergies   . Sleep apnea     Past Surgical History:  Procedure Laterality Date  . COLONOSCOPY    . UPPER GASTROINTESTINAL ENDOSCOPY      Social History   Socioeconomic History  . Marital status: Married    Spouse name: Not on file  . Number of children: 3  . Years of education: Not on file  . Highest education level: Not on file  Occupational History  . Occupation: busines Administrator, computers  Social Needs  . Financial resource strain: Not on file  . Food insecurity:    Worry: Not on file    Inability: Not on file  . Transportation needs:    Medical: Not on file    Non-medical: Not on file  Tobacco Use  . Smoking status: Former Smoker    Packs/day: 0.50    Years: 3.00    Pack years: 1.50    Types: Cigarettes    Last attempt to quit: 10/04/2008    Years since quitting: 9.7  . Smokeless tobacco: Never Used  Substance and Sexual Activity  . Alcohol use: No    Alcohol/week: 0.0 standard drinks    Comment: rare  . Drug use: No  . Sexual activity: Not on file  Lifestyle  . Physical activity:    Days per  week: Not on file    Minutes per session: Not on file  . Stress: Not on file  Relationships  . Social connections:    Talks on phone: Not on file    Gets together: Not on file    Attends religious service: Not on file    Active member of club or organization: Not on file    Attends meetings of clubs or organizations: Not on file    Relationship status: Not on file  . Intimate partner violence:    Fear of current or ex partner: Not on file    Emotionally abused: Not on file    Physically abused: Not on file    Forced sexual activity: Not on file  Other Topics Concern  . Not on file  Social History Narrative   Household: pt, wife,    2001, 2007, 2008         Family History  Problem Relation Age of Onset  . Pancreatitis Mother   . Diabetes Mother   . Hyperlipidemia Mother   . Colon polyps Mother        late 98s  . Mitral valve prolapse Mother   .  Colon cancer Maternal Uncle        uncle in his 93s  . Epilepsy Father   . Colon cancer Maternal Grandfather   . CAD Other        GF in his 48s  . Irritable bowel syndrome Unknown   . Colitis Unknown   . Heart disease Unknown   . Prostate cancer Neg Hx      Allergies as of 06/23/2018   No Known Allergies     Medication List        Accurate as of 06/23/18 11:59 PM. Always use your most recent med list.          acetaminophen 500 MG tablet Commonly known as:  TYLENOL Take 1,000 mg by mouth every 6 (six) hours as needed for moderate pain.   AMBULATORY NON FORMULARY MEDICATION Medication Name: FDgard take 1 to 2 before meals as needed   aspirin EC 81 MG tablet Take 81 mg by mouth daily.   azelastine 0.1 % nasal spray Commonly known as:  ASTELIN Place 2 sprays into both nostrils at bedtime as needed for rhinitis. Use in each nostril as directed   beclomethasone 80 MCG/ACT inhaler Commonly known as:  QVAR Inhale 2 puffs into the lungs daily.   cetirizine 10 MG tablet Commonly known as:  ZYRTEC Take 10 mg by  mouth daily.   dexlansoprazole 60 MG capsule Commonly known as:  DEXILANT Take 1 capsule (60 mg total) by mouth daily.   dicyclomine 20 MG tablet Commonly known as:  BENTYL Take 1 tablet (20 mg total) by mouth 3 (three) times daily before meals.   fluticasone 50 MCG/ACT nasal spray Commonly known as:  FLONASE Place 2 sprays into both nostrils daily.   methocarbamol 500 MG tablet Commonly known as:  ROBAXIN Take 1 tablet (500 mg total) by mouth 2 (two) times daily.   naproxen 375 MG tablet Commonly known as:  NAPROSYN Take 1 tablet (375 mg total) by mouth 2 (two) times daily.   ondansetron 4 MG tablet Commonly known as:  ZOFRAN Take 1 tablet (4 mg total) by mouth every 8 (eight) hours as needed.          Objective:   Physical Exam BP 128/70 (BP Location: Left Arm, Patient Position: Sitting, Cuff Size: Normal)   Pulse 69   Temp 98 F (36.7 C) (Oral)   Resp 16   Ht 6' (1.829 m)   Wt 255 lb 6 oz (115.8 kg)   SpO2 98%   BMI 34.64 kg/m  General: Well developed, NAD, see BMI.  Neck: No  thyromegaly  HEENT:  Normocephalic . Face symmetric, atraumatic Lungs:  CTA B Normal respiratory effort, no intercostal retractions, no accessory muscle use. Heart: RRR,  no murmur.  No pretibial edema bilaterally  Abdomen:  Not distended, soft, non-tender. No rebound or rigidity.   Skin: Exposed areas without rash. Not pale. Not jaundice MSK: Shoulder symmetric, range of motion normal. Neurologic:  alert & oriented X3.  Speech normal, gait appropriate for age and unassisted Strength symmetric and appropriate for age.  Psych: Cognition and judgment appear intact.  Cooperative with normal attention span and concentration.  Behavior appropriate. No anxious or depressed appearing.     Assessment & Plan:   Assessment DM Hyperlipidemia Bronchospasm (on Qvar) GI: --History of PUD (EGD @ [facility]  2002: neg  but reports had another EGD with ulcer) --IBS, occ  constipation --GERD -- fatty liver per u/s 06-2015  OSA: (-)  home sleep apnea test 2014,  Full sleep study~08-2015 : mild OSA, saw pulmonary, rx dental appliance, wt loss  - neg stress test 03/2015  PLAN DM: Diet controlled, trying to improve his lifestyle, check a A1c and micro Hyperlipidemia: Stop taking Lipitor and fenofibrate few months, no s/e he just likes to see his baseline.  Checking labs Bronchospasm: Uses Qvar daily, feels that it is beneficial.  Currently with no symptoms Allergies: Allergic rhinitis symptoms are not controlled, on Flonase, not using Astelin consistently.  Request an allergist referral.  Will do. rec consistent use of astelin IBS: Symptoms currently controlled with Bentyl, take it as needed, episodes have become less frequent.  Plans to see GI. L shoulder pain: rec obs for now, takes naproxen sporadically  OSA: Last sleep study 2016, mild OSA, was recommend a dental appliance and but never did.  Epworth scale today 12.  Risks of OSA discussed, refer back to Dr. Halford Chessman  RTC 6 months.

## 2018-06-23 NOTE — Patient Instructions (Signed)
GO TO THE LAB : Get the blood work     GO TO THE FRONT DESK Schedule your next appointment for a  Check up in 6 months  Use astelin at night consistently

## 2018-06-23 NOTE — Assessment & Plan Note (Addendum)
-  Td 2010;  pnm shot: 2016; prevnar 2017;  Flu shot today -CCS: +FH   colon polyps (mother, early 83s ) and colon cancer ( uncle, mid 61s); cscope 12-2015 , next per GI  -Diet and exercise discussed, he is actually trying to do better, has talked w/ a dietitian/coach at work.  Trying to reduce portions and stay active. -Labs:   CMP, FLP, CBC, A1c, micro

## 2018-06-23 NOTE — Progress Notes (Signed)
Pre visit review using our clinic review tool, if applicable. No additional management support is needed unless otherwise documented below in the visit note. 

## 2018-06-25 NOTE — Assessment & Plan Note (Signed)
DM: Diet controlled, trying to improve his lifestyle, check a A1c and micro Hyperlipidemia: Stop taking Lipitor and fenofibrate few months, no s/e he just likes to see his baseline.  Checking labs Bronchospasm: Uses Qvar daily, feels that it is beneficial.  Currently with no symptoms Allergies: Allergic rhinitis symptoms are not controlled, on Flonase, not using Astelin consistently.  Request an allergist referral.  Will do. rec consistent use of astelin IBS: Symptoms currently controlled with Bentyl, take it as needed, episodes have become less frequent.  Plans to see GI. L shoulder pain: rec obs for now, takes naproxen sporadically  OSA: Last sleep study 2016, mild OSA, was recommend a dental appliance and but never did.  Epworth scale today 12.  Risks of OSA discussed, refer back to Dr. Halford Chessman  RTC 6 months.

## 2018-06-26 MED ORDER — ATORVASTATIN CALCIUM 40 MG PO TABS: 40.0000 mg | ORAL_TABLET | Freq: Every day | ORAL | 6 refills | Status: DC

## 2018-06-26 NOTE — Addendum Note (Signed)
Addended byDamita Dunnings D on: 06/26/2018 01:14 PM   Modules accepted: Orders

## 2018-06-28 DIAGNOSIS — Z713 Dietary counseling and surveillance: Secondary | ICD-10-CM | POA: Diagnosis not present

## 2018-07-12 ENCOUNTER — Encounter: Payer: Self-pay | Admitting: Pulmonary Disease

## 2018-07-12 ENCOUNTER — Ambulatory Visit (INDEPENDENT_AMBULATORY_CARE_PROVIDER_SITE_OTHER): Payer: BLUE CROSS/BLUE SHIELD | Admitting: Pulmonary Disease

## 2018-07-12 VITALS — BP 118/74 | HR 77 | Ht 72.0 in | Wt 253.0 lb

## 2018-07-12 DIAGNOSIS — G4733 Obstructive sleep apnea (adult) (pediatric): Secondary | ICD-10-CM

## 2018-07-12 DIAGNOSIS — J45909 Unspecified asthma, uncomplicated: Secondary | ICD-10-CM | POA: Diagnosis not present

## 2018-07-12 NOTE — Progress Notes (Signed)
[facility]  Chief Complaint  Patient presents with  . Follow-up    Pt requesting HST to see if any changes with OSA. Pt wakes up tired in the mornings, does not feel rested.     Constitutional:  BP 118/74 (BP Location: Left Arm, Cuff Size: Normal)   Pulse 77   Ht 6' (1.829 m)   Wt 253 lb (114.8 kg)   SpO2 96%   BMI 34.31 kg/m   Past Medical History:  Allergic rhinitis, Asthma, DM, GERD, HLD, IBS  Brief Summary:  Wayne Melton is a 47 y.o. male with obstructive sleep apnea.  He had sleep study in 2016.  Showed mild sleep apnea.  Opted for oral appliance at that time, but never got this set up.  His weight at that time was 261 lbs.  His sleep has gotten worse.  He snores and wakes up feeling like he can't breath.  He uses the bathroom once per night.  He is more sleepy during the day.  He also has asthma.  He is worried about whether his symptoms could be related to exposures he had in the [employer].  He is not currently having cough, wheeze, or sputum.  He has nasal deviation.  He gets frequent sinus congestion and his sleep is worse then.  He also has congestion in his ears.  Hasn't seen ENT recently.    Physical Exam:   Appearance - well kempt  ENMT - clear nasal mucosa, deviated nasal septum, no oral exudates, no LAN, trachea midline Respiratory - normal chest wall, normal respiratory effort, no accessory muscle use, no wheeze/rales CV - s1s2 regular rate and rhythm, no murmurs, no peripheral edema, radial pulses symmetric GI - soft, non tender, no masses Lymph - no adenopathy noted in neck and axillary areas MSK - normal muscle strength and tone, normal gait Ext - no cyanosis, clubbing, or joint inflammation noted Skin - no rashes, lesions, or ulcers Neuro - oriented to person, place, and time Psych - normal mood and affect   CMP Latest Ref Rng & Units 06/23/2018 06/22/2017 06/17/2016  Glucose 70 - 99 mg/dL 90 90 101(H)  BUN  6 - 23 mg/dL 11 12 12   Creatinine 0.40 - 1.50 mg/dL 0.97 1.27 1.08  Sodium 135 - 145 mEq/L 140 140 139  Potassium 3.5 - 5.1 mEq/L 4.7 3.9 4.3  Chloride 96 - 112 mEq/L 102 106 107  CO2 19 - 32 mEq/L 33(H) 27 29  Calcium 8.4 - 10.5 mg/dL 10.0 9.6 9.1  Total Protein 6.0 - 8.3 g/dL 7.1 7.3 6.9  Total Bilirubin 0.2 - 1.2 mg/dL 0.5 0.6 0.5  Alkaline Phos 39 - 117 U/L 84 62 66  AST 0 - 37 U/L 9 16 12   ALT 0 - 53 U/L 28 42 42    CBC Latest Ref Rng & Units 06/23/2018 06/22/2017 06/17/2016  WBC 4.0 - 10.5 K/uL 7.8 5.9 6.7  Hemoglobin 13.0 - 17.0 g/dL 15.8 14.8 13.7  Hematocrit 39.0 - 52.0 % 46.6 42.9 40.5  Platelets 150.0 - 400.0 K/uL 335.0 363.0 341.0    Assessment/Plan:   Obstructive sleep apnea. - will arrange for home sleep study to assess current status of sleep apnea and then determine if he needs to consider additional therapy  Nasal septal deviation. - might need ENT assessment, especially if he wants to try surgery for sleep apnea - continue flonase, astelin, zyrtec  Persistent asthma. - continue Qvar - if his  symptoms persist after his sleep apnea is addressed, then would need further assessment with PFT, CXR and labs - [facility] if [facility] has some [facility] connection related to his respiratory symptoms   Patient Instructions  Will arrange for home sleep study Will call to arrange for follow up after sleep study reviewed   Time spent 27 minutes  Chesley Mires, MD [place] Pager: [phone] 07/12/2018, 2:55 PM  Flow Sheet    Sleep tests:  PSG 08/31/15 >> AHI 7.2, SpO2 low 85%  Medications:   Allergies as of 07/12/2018   No Known Allergies     Medication List        Accurate as of 07/12/18  2:55 PM. Always use your most recent med list.          acetaminophen 500 MG tablet Commonly known as:  TYLENOL Take 1,000 mg by mouth every 6 (six) hours as needed for moderate pain.   AMBULATORY NON FORMULARY  MEDICATION Medication Name: FDgard take 1 to 2 before meals as needed   aspirin EC 81 MG tablet Take 81 mg by mouth daily.   atorvastatin 40 MG tablet Commonly known as:  LIPITOR Take 1 tablet (40 mg total) by mouth at bedtime.   azelastine 0.1 % nasal spray Commonly known as:  ASTELIN Place 2 sprays into both nostrils at bedtime as needed for rhinitis. Use in each nostril as directed   beclomethasone 80 MCG/ACT inhaler Commonly known as:  QVAR Inhale 2 puffs into the lungs daily.   cetirizine 10 MG tablet Commonly known as:  ZYRTEC Take 10 mg by mouth daily.   dexlansoprazole 60 MG capsule Commonly known as:  DEXILANT Take 1 capsule (60 mg total) by mouth daily.   dicyclomine 20 MG tablet Commonly known as:  BENTYL Take 1 tablet (20 mg total) by mouth 3 (three) times daily before meals.   fluticasone 50 MCG/ACT nasal spray Commonly known as:  FLONASE Place 2 sprays into both nostrils daily.   methocarbamol 500 MG tablet Commonly known as:  ROBAXIN Take 1 tablet (500 mg total) by mouth 2 (two) times daily.   naproxen 375 MG tablet Commonly known as:  NAPROSYN Take 1 tablet (375 mg total) by mouth 2 (two) times daily.   ondansetron 4 MG tablet Commonly known as:  ZOFRAN Take 1 tablet (4 mg total) by mouth every 8 (eight) hours as needed.       Past Surgical History:  He  has a past surgical history that includes Colonoscopy and Upper gastrointestinal endoscopy.  Family History:  His family history includes CAD in his other; Colitis in his unknown relative; Colon cancer in his maternal grandfather and maternal uncle; Colon polyps in his mother; Diabetes in his mother; Epilepsy in his father; Heart disease in his unknown relative; Hyperlipidemia in his mother; Irritable bowel syndrome in his unknown relative; Mitral valve prolapse in his mother; Pancreatitis in his mother.  Social History:  He  reports that he quit smoking about 9 years ago. His smoking use  included cigarettes. He has a 1.50 pack-year smoking history. He has never used smokeless tobacco. He reports that he does not drink alcohol or use drugs.

## 2018-07-12 NOTE — Patient Instructions (Signed)
Will arrange for home sleep study Will call to arrange for follow up after sleep study reviewed  

## 2018-08-10 DIAGNOSIS — G4733 Obstructive sleep apnea (adult) (pediatric): Secondary | ICD-10-CM | POA: Diagnosis not present

## 2018-08-11 ENCOUNTER — Other Ambulatory Visit: Payer: Self-pay | Admitting: *Deleted

## 2018-08-11 DIAGNOSIS — G4733 Obstructive sleep apnea (adult) (pediatric): Secondary | ICD-10-CM

## 2018-08-16 ENCOUNTER — Telehealth: Payer: Self-pay | Admitting: Pulmonary Disease

## 2018-08-16 DIAGNOSIS — G4733 Obstructive sleep apnea (adult) (pediatric): Secondary | ICD-10-CM | POA: Diagnosis not present

## 2018-08-16 DIAGNOSIS — Z713 Dietary counseling and surveillance: Secondary | ICD-10-CM | POA: Diagnosis not present

## 2018-08-16 NOTE — Telephone Encounter (Signed)
HST 08/10/18 >> AHI 13.9, SpO2 low 76%   Please let him know that the sleep study showed mild obstructive sleep apnea.  Please arrange for ROV with a nurse practitioner to review treatment options.

## 2018-08-21 NOTE — Telephone Encounter (Signed)
Attempted to call patient today regarding results. I did not receive an answer at time of call. I have left a voicemail message for pt to return call. X1  

## 2018-08-22 NOTE — Telephone Encounter (Signed)
Attempted to call patient today regarding results. I did not receive an answer at time of call. I have left a voicemail message for pt to return call. X2  

## 2018-08-28 NOTE — Telephone Encounter (Signed)
Attempted to call Patient on mobile phone x's 2, received busy signal both times.  Left message on home answering machine to call back.

## 2018-08-30 ENCOUNTER — Ambulatory Visit: Payer: Self-pay

## 2018-08-30 NOTE — Telephone Encounter (Signed)
Returned call to pt. to discuss sx's.  Reported hx of IBS.  C/o abdominal bloating over past 2 weeks, but has had worsening symptoms of "sharp" abd. pain in mid upper to left upper abdomen, and fatigue, since Sunday.  Stated the abdominal pain occurs about 30 min.- 2 hrs. after eating, and lasts approx. 30-60" intervals.  Reported abdominal bloating, nausea, very dry mouth, and chills, since Sunday, 11/24.   Reported no vomiting.  Stated bowels are moving normally; tends to have constipation with his IBS, but has reported having stools daily.  Stated his current symptoms are not the typical cramps that he experiences with his IBS.  Today, has taken Zofran, and feels better.  Reported the Dicyclomine has not helped the pain.  Stated has not f/u with GI, as was discussed at his appt. in 06/2018.   Appt. sched. 11/29 for further eval.  Care advice given per protocol; verb. understanding.  Advised to go to ER, if worsening symptoms of severe, persistent abdominal pain and vomiting.  Agreed w/ plan.          Reason for Disposition . [1] MODERATE pain (e.g., interferes with normal activities) AND [2] pain comes and goes (cramps) AND [3] present > 24 hours  (Exception: pain with Vomiting or Diarrhea - see that Guideline)  Answer Assessment - Initial Assessment Questions 1. LOCATION: "Where does it hurt?"      Upper mid abdomen, just below the sternum   2. RADIATION: "Does the pain shoot anywhere else?" (e.g., chest, back)     Mid to left upper abdomen 3. ONSET: "When did the pain begin?" (Minutes, hours or days ago)      2 weeks ago with onset of abdominal bloating  4. SUDDEN: "Gradual or sudden onset?"     Gradual  5. PATTERN "Does the pain come and go, or is it constant?"    - If constant: "Is it getting better, staying the same, or worsening?"      (Note: Constant means the pain never goes away completely; most serious pain is constant and it progresses)     - If intermittent: "How long does it last?"  "Do you have pain now?"     (Note: Intermittent means the pain goes away completely between bouts)     intermittent 6. SEVERITY: "How bad is the pain?"  (e.g., Scale 1-10; mild, moderate, or severe)    - MILD (1-3): doesn\'t interfere with normal activities, abdomen soft and not tender to touch     - MODERATE (4-7): interferes with normal activities or awakens from sleep, tender to touch     - SEVERE (8-10): excruciating pain, doubled over, unable to do any normal activities       3-4/10 7. RECURRENT SYMPTOM: "Have you ever had this type of abdominal pain before?" If so, ask: "When was the last time?" and "What happened that time?"      Has had previous episodes like this 8. CAUSE: "What do you think is causing the abdominal pain?"      9. RELIEVING/AGGRAVATING FACTORS: "What makes it better or worse?" (e.g., movement, antacids, bowel movement)    If eats spicy, tomato sauce, or increased fatty foods make it worse.  Has Zofran on hand, and this makes symptoms better     10 . OTHER SYMPTOMS: "Has there been any vomiting, diarrhea, constipation, or urine problems?"      Nausea, dry mouth, fatigue, abdominal bloating, chills/ no temperature, increased acid reflux x 1  Protocols  used: ABDOMINAL PAIN - MALE-A-AH  Message from Nani Ravens sent at 08/30/2018 12:28 PM EST   Pt says that he is having sharp abdominal pain. Pt says that he is having some bloating and would like to discuss further. Pt declined apt for today.   CB: [id number]

## 2018-09-01 ENCOUNTER — Ambulatory Visit: Payer: BLUE CROSS/BLUE SHIELD | Admitting: Family

## 2018-09-01 ENCOUNTER — Encounter: Payer: Self-pay | Admitting: Family

## 2018-09-01 ENCOUNTER — Other Ambulatory Visit (INDEPENDENT_AMBULATORY_CARE_PROVIDER_SITE_OTHER): Payer: BLUE CROSS/BLUE SHIELD

## 2018-09-01 VITALS — BP 130/80 | HR 76 | Temp 97.7°F | Ht 72.0 in | Wt 254.0 lb

## 2018-09-01 DIAGNOSIS — R1013 Epigastric pain: Secondary | ICD-10-CM

## 2018-09-01 LAB — COMPREHENSIVE METABOLIC PANEL
ALK PHOS: 81 U/L (ref 39–117)
ALT: 23 U/L (ref 0–53)
AST: 8 U/L (ref 0–37)
Albumin: 4.1 g/dL (ref 3.5–5.2)
BUN: 12 mg/dL (ref 6–23)
CALCIUM: 9.2 mg/dL (ref 8.4–10.5)
CO2: 28 meq/L (ref 19–32)
CREATININE: 0.92 mg/dL (ref 0.40–1.50)
Chloride: 103 mEq/L (ref 96–112)
GFR: 94.49 mL/min (ref >60.00)
GLUCOSE: 112 mg/dL — AB (ref 70–99)
Potassium: 3.9 mEq/L (ref 3.5–5.1)
Sodium: 140 mEq/L (ref 135–145)
TOTAL PROTEIN: 7 g/dL (ref 6.0–8.3)
Total Bilirubin: 0.5 mg/dL (ref 0.2–1.2)

## 2018-09-01 LAB — CBC WITH DIFFERENTIAL/PLATELET
Basophils Absolute: 0 10*3/uL (ref 0.0–0.1)
Basophils Relative: 0.4 % (ref 0.0–3.0)
EOS PCT: 0.5 % (ref 0.0–5.0)
Eosinophils Absolute: 0 10*3/uL (ref 0.0–0.7)
HEMATOCRIT: 43.3 % (ref 39.0–52.0)
Hemoglobin: 14.7 g/dL (ref 13.0–17.0)
LYMPHS ABS: 2.1 10*3/uL (ref 0.7–4.0)
LYMPHS PCT: 24.9 % (ref 12.0–46.0)
MCHC: 34 g/dL (ref 30.0–36.0)
MCV: 88 fl (ref 78.0–100.0)
MONOS PCT: 6.2 % (ref 3.0–12.0)
Monocytes Absolute: 0.5 10*3/uL (ref 0.1–1.0)
NEUTROS ABS: 5.7 10*3/uL (ref 1.4–7.7)
NEUTROS PCT: 68 % (ref 43.0–77.0)
PLATELETS: 311 10*3/uL (ref 150.0–400.0)
RBC: 4.91 Mil/uL (ref 4.22–5.81)
RDW: 13 % (ref 11.5–15.5)
WBC: 8.4 10*3/uL (ref 4.0–10.5)

## 2018-09-01 LAB — LIPASE: LIPASE: 26 U/L (ref 11.0–59.0)

## 2018-09-01 NOTE — Addendum Note (Signed)
Addended by: Karle Barr on: 09/01/2018 09:17 AM   Modules accepted: Orders

## 2018-09-01 NOTE — Progress Notes (Signed)
Subjective:    Patient ID: Wayne Melton, male    DOB: June 12, 1971, 47 y.o.   MRN: [id number]  HPI  Patient is a 47 yr old male who presents today with chief complaint of abdominal pain.  He reprots + hx of IBS.  Reports he had some upper abdominal complaints. + nausea. Reports symptoms had been going for a few weeks but it intensified Sunday night. No fever or vomiting. Was able to eat some thanksgiving dinner.  Reports that he felt hot last night and checked his temperature and it was 98.5.  Reports no pain currently.  Last BM was 2 AM today, formed.  He is followed by GI- Dr. Enis Gash.  Notes  That he had acid reflux on Sunday night which is unusual.  Then he had sharp upper abdominal pains but symptoms worsen.     Review of Systems    see HPI  Past Medical History:  Diagnosis Date  . Allergic rhinitis 07/05/2012  . Constipation    chronic  . Diabetes mellitus without complication (HCC)    No meds  . GERD , HH, h/o PUD 07/05/2012  . Hyperlipidemia   . IBS (irritable bowel syndrome) 11/06/2012  . Obesity   . OSA (obstructive sleep apnea)    mild  . Seasonal allergies   . Sleep apnea      Social History   Socioeconomic History  . Marital status: Married    Spouse name: Not on file  . Number of children: 3  . Years of education: Not on file  . Highest education level: Not on file  Occupational History  . Occupation: busines Administrator, computers  Social Needs  . Financial resource strain: Not on file  . Food insecurity:    Worry: Not on file    Inability: Not on file  . Transportation needs:    Medical: Not on file    Non-medical: Not on file  Tobacco Use  . Smoking status: Former Smoker    Packs/day: 0.50    Years: 3.00    Pack years: 1.50    Types: Cigarettes    Last attempt to quit: 10/04/2008    Years since quitting: 9.9  . Smokeless tobacco: Never Used  Substance and Sexual Activity  . Alcohol use: No    Alcohol/week: 0.0 standard drinks    Comment: rare    . Drug use: No  . Sexual activity: Not on file  Lifestyle  . Physical activity:    Days per week: Not on file    Minutes per session: Not on file  . Stress: Not on file  Relationships  . Social connections:    Talks on phone: Not on file    Gets together: Not on file    Attends religious service: Not on file    Active member of club or organization: Not on file    Attends meetings of clubs or organizations: Not on file    Relationship status: Not on file  . Intimate partner violence:    Fear of current or ex partner: Not on file    Emotionally abused: Not on file    Physically abused: Not on file    Forced sexual activity: Not on file  Other Topics Concern  . Not on file  Social History Narrative   Household: pt, wife,    2001, 2007, 2008        Past Surgical History:  Procedure Laterality Date  . COLONOSCOPY    .  UPPER GASTROINTESTINAL ENDOSCOPY      Family History  Problem Relation Age of Onset  . Pancreatitis Mother   . Diabetes Mother   . Hyperlipidemia Mother   . Colon polyps Mother        late 40s  . Mitral valve prolapse Mother   . Colon cancer Maternal Uncle        uncle in his 58s  . Epilepsy Father   . Colon cancer Maternal Grandfather   . CAD Other        GF in his 72s  . Irritable bowel syndrome Unknown   . Colitis Unknown   . Heart disease Unknown   . Prostate cancer Neg Hx     No Known Allergies  Current Outpatient Medications on File Prior to Visit  Medication Sig Dispense Refill  . acetaminophen (TYLENOL) 500 MG tablet Take 1,000 mg by mouth every 6 (six) hours as needed for moderate pain.    Marland Kitchen AMBULATORY NON FORMULARY MEDICATION Medication Name: FDgard take 1 to 2 before meals as needed 12 capsule 0  . aspirin EC 81 MG tablet Take 81 mg by mouth daily.    Marland Kitchen atorvastatin (LIPITOR) 40 MG tablet Take 1 tablet (40 mg total) by mouth at bedtime. 30 tablet 6  . azelastine (ASTELIN) 0.1 % nasal spray Place 2 sprays into both nostrils at  bedtime as needed for rhinitis. Use in each nostril as directed 30 mL 3  . beclomethasone (QVAR REDIHALER) 80 MCG/ACT inhaler Inhale 2 puffs into the lungs daily. 31.8 g 1  . cetirizine (ZYRTEC) 10 MG tablet Take 10 mg by mouth daily.    Marland Kitchen dexlansoprazole (DEXILANT) 60 MG capsule Take 1 capsule (60 mg total) by mouth daily. 90 capsule 0  . dicyclomine (BENTYL) 20 MG tablet Take 1 tablet (20 mg total) by mouth 3 (three) times daily before meals. 60 tablet 0  . fluticasone (FLONASE) 50 MCG/ACT nasal spray Place 2 sprays into both nostrils daily. 16 g 0  . methocarbamol (ROBAXIN) 500 MG tablet Take 1 tablet (500 mg total) by mouth 2 (two) times daily. 20 tablet 0  . naproxen (NAPROSYN) 375 MG tablet Take 1 tablet (375 mg total) by mouth 2 (two) times daily. 20 tablet 0  . ondansetron (ZOFRAN) 4 MG tablet Take 1 tablet (4 mg total) by mouth every 8 (eight) hours as needed. 20 tablet 0   No current facility-administered medications on file prior to visit.     BP 130/80   Pulse 76   Temp 97.7 F (36.5 C) (Oral)   Ht 6' (1.829 m)   Wt 254 lb (115.2 kg)   SpO2 98%   BMI 34.45 kg/m    Objective:   Physical Exam  Constitutional: He is oriented to person, place, and time. He appears well-developed and well-nourished. No distress.  HENT:  Head: Normocephalic and atraumatic.  Cardiovascular: Normal rate and regular rhythm.  No murmur heard. Pulmonary/Chest: Effort normal and breath sounds normal. No respiratory distress. He has no wheezes. He has no rales.  Abdominal: Soft. Bowel sounds are normal. He exhibits no distension.  Mild epigastric tenderness without guarding  Musculoskeletal: He exhibits no edema.  Neurological: He is alert and oriented to person, place, and time.  Skin: Skin is warm and dry.  Psychiatric: He has a normal mood and affect. His behavior is normal. Thought content normal.          Assessment & Plan:  epigastric pain- suspect resolving gastroenteritis.  Will  obtain CMET, CBC, Lipase to further evaluate.  If symptoms worsen or labs concerning will need abdominal imaging.  Pt is advised as follows:  Please complete lab work prior to leaving. Continue zofran as needed for nausea as well as dexilant for acid reflux. Call if increased pain/nausea/vomiting/fever or if your symptoms do not continue to improve.  Please schedule a follow up appointment with Dr. Havery Moros.

## 2018-09-01 NOTE — Patient Instructions (Signed)
Please complete lab work prior to leaving. Continue zofran as needed for nausea as well as dexilant for acid reflux. Call if increased pain/nausea/vomiting/fever or if your symptoms do not continue to improve.  Please schedule a follow up appointment with Dr. Havery Moros.

## 2018-09-04 NOTE — Telephone Encounter (Signed)
Patient is aware of results and nothing further is needed at this time. Apt made.

## 2018-09-15 ENCOUNTER — Ambulatory Visit: Payer: BLUE CROSS/BLUE SHIELD | Admitting: Primary Care

## 2018-09-15 ENCOUNTER — Encounter: Payer: Self-pay | Admitting: Primary Care

## 2018-09-15 VITALS — BP 112/70 | HR 61 | Temp 98.1°F | Ht 72.0 in | Wt 258.4 lb

## 2018-09-15 DIAGNOSIS — Z6835 Body mass index (BMI) 35.0-35.9, adult: Secondary | ICD-10-CM

## 2018-09-15 DIAGNOSIS — G4733 Obstructive sleep apnea (adult) (pediatric): Secondary | ICD-10-CM

## 2018-09-15 NOTE — Patient Instructions (Signed)
Referral to DME for new CPAP, auto titrate 5-15cm H20, mask of choice, supplies and humidification  Referral to healthy weight and wellness  Follow-up in 31-90 days once started CPAP with Dr. Myer Haff NP

## 2018-09-15 NOTE — Assessment & Plan Note (Addendum)
-   Discussed importance of weight loss and increasing physical activity  - His goal weight is 200lbs  - Referral to healthy weight and wellness

## 2018-09-15 NOTE — Assessment & Plan Note (Addendum)
-   AHI 13.9 on HST in November 2019 (AHI 7.2 in 2016) - Discussed treatment options for obstructive sleep apnea including weight loss, sleep position, dental device, CPAP and surgery. He is interested in trailing cpap and losing weight for overall health reason.  - Referral for new CPAP start, auto titrate 5-15cm H20, mask of choice, supplies and humidification - Follow up in 31-90 days with Dr. Halford Melton or NP

## 2018-09-15 NOTE — Progress Notes (Signed)
@Patient  ID: Wayne Melton, male    DOB: 25-Aug-1971, 47 y.o.   MRN: [id number]  Chief Complaint  Patient presents with  . Follow-up    folow up on HST    Referring provider: Colon Branch, MD  HPI: 47 year old male, former smoker. PMH significant for OSA, allergic rhinitis, GERD, diabetes mellitus, hyperlipidemia. Partient of Dr. Halford Chessman, last seen on 07/12/18. HST on 08/10/18 showed AHI 13.9, SpO2 low 76%.   09/15/2018 Patient presents today for sleep follow-up. Had mild OSA on sleep study back in 2016. He is up 10 lbs since then. Complains of daytime drowsyness, could fall asleep in a chair, needs naps on his day off. He is now waking up around 3-4am gasping for air, happening more often. Discussed treatment options for obstructive sleep apnea including weight loss, sleep position, dental device, CPAP and surgery. He is interested in trailing cpap and losing weight for overall health reason.     No Known Allergies  Immunization History  Administered Date(s) Administered  . Influenza,inj,Quad PF,6+ Mos 08/19/2015, 06/17/2016, 06/22/2017, 06/23/2018  . Pneumococcal Conjugate-13 06/17/2016  . Pneumococcal Polysaccharide-23 02/11/2015  . Tdap 11/18/2008, 09/26/2015    Past Medical History:  Diagnosis Date  . Allergic rhinitis 07/05/2012  . Constipation    chronic  . Diabetes mellitus without complication (HCC)    No meds  . GERD , HH, h/o PUD 07/05/2012  . Hyperlipidemia   . IBS (irritable bowel syndrome) 11/06/2012  . Obesity   . OSA (obstructive sleep apnea)    mild  . Seasonal allergies   . Sleep apnea     Tobacco History: Social History   Tobacco Use  Smoking Status Former Smoker  . Packs/day: 0.50  . Years: 3.00  . Pack years: 1.50  . Types: Cigarettes  . Last attempt to quit: 10/04/2008  . Years since quitting: 9.9  Smokeless Tobacco Never Used   Counseling given: Not Answered   Outpatient Medications Prior to Visit  Medication Sig Dispense Refill  .  acetaminophen (TYLENOL) 500 MG tablet Take 1,000 mg by mouth every 6 (six) hours as needed for moderate pain.    Marland Kitchen AMBULATORY NON FORMULARY MEDICATION Medication Name: FDgard take 1 to 2 before meals as needed 12 capsule 0  . aspirin EC 81 MG tablet Take 81 mg by mouth daily.    Marland Kitchen atorvastatin (LIPITOR) 40 MG tablet Take 1 tablet (40 mg total) by mouth at bedtime. 30 tablet 6  . azelastine (ASTELIN) 0.1 % nasal spray Place 2 sprays into both nostrils at bedtime as needed for rhinitis. Use in each nostril as directed 30 mL 3  . beclomethasone (QVAR REDIHALER) 80 MCG/ACT inhaler Inhale 2 puffs into the lungs daily. 31.8 g 1  . cetirizine (ZYRTEC) 10 MG tablet Take 10 mg by mouth daily.    Marland Kitchen dexlansoprazole (DEXILANT) 60 MG capsule Take 1 capsule (60 mg total) by mouth daily. 90 capsule 0  . dicyclomine (BENTYL) 20 MG tablet Take 1 tablet (20 mg total) by mouth 3 (three) times daily before meals. 60 tablet 0  . fluticasone (FLONASE) 50 MCG/ACT nasal spray Place 2 sprays into both nostrils daily. 16 g 0  . methocarbamol (ROBAXIN) 500 MG tablet Take 1 tablet (500 mg total) by mouth 2 (two) times daily. 20 tablet 0  . naproxen (NAPROSYN) 375 MG tablet Take 1 tablet (375 mg total) by mouth 2 (two) times daily. 20 tablet 0  . ondansetron (ZOFRAN) 4 MG tablet Take  1 tablet (4 mg total) by mouth every 8 (eight) hours as needed. 20 tablet 0   No facility-administered medications prior to visit.     Review of Systems  Review of Systems  Constitutional: Positive for fatigue.  HENT: Negative.   Respiratory: Positive for apnea.   Cardiovascular: Negative.      Physical Exam  BP 112/70 (BP Location: Left Arm, Cuff Size: Normal)   Pulse 61   Temp 98.1 F (36.7 C)   Ht 6' (1.829 m)   Wt 258 lb 6.4 oz (117.2 kg)   SpO2 97%   BMI 35.05 kg/m  Physical Exam Constitutional:      Appearance: He is well-developed. He is obese.  HENT:     Head: Normocephalic and atraumatic.     Mouth/Throat:      Comments: Mallampati class II Eyes:     Pupils: Pupils are equal, round, and reactive to light.  Neck:     Musculoskeletal: Normal range of motion and neck supple.     Comments: Large neck  Cardiovascular:     Rate and Rhythm: Normal rate and regular rhythm.     Heart sounds: Normal heart sounds.  Pulmonary:     Effort: Pulmonary effort is normal. No respiratory distress.     Breath sounds: Normal breath sounds. No wheezing.  Musculoskeletal: Normal range of motion.  Skin:    General: Skin is warm and dry.     Findings: No erythema or rash.  Neurological:     General: No focal deficit present.     Mental Status: He is alert and oriented to person, place, and time. Mental status is at baseline.  Psychiatric:        Behavior: Behavior normal.        Judgment: Judgment normal.      Lab Results:  CBC    Component Value Date/Time   WBC 8.4 09/01/2018 0917   RBC 4.91 09/01/2018 0917   HGB 14.7 09/01/2018 0917   HCT 43.3 09/01/2018 0917   PLT 311.0 09/01/2018 0917   MCV 88.0 09/01/2018 0917   MCH 29.6 11/03/2014 0004   MCHC 34.0 09/01/2018 0917   RDW 13.0 09/01/2018 0917   LYMPHSABS 2.1 09/01/2018 0917   MONOABS 0.5 09/01/2018 0917   EOSABS 0.0 09/01/2018 0917   BASOSABS 0.0 09/01/2018 0917    BMET    Component Value Date/Time   NA 140 09/01/2018 0917   K 3.9 09/01/2018 0917   CL 103 09/01/2018 0917   CO2 28 09/01/2018 0917   GLUCOSE 112 (H) 09/01/2018 0917   BUN 12 09/01/2018 0917   CREATININE 0.92 09/01/2018 0917   CALCIUM 9.2 09/01/2018 0917   GFRNONAA >90 11/03/2014 0004   GFRAA >90 11/03/2014 0004    BNP No results found for: BNP  ProBNP No results found for: PROBNP  Imaging: No results found.   Assessment & Plan:   OSA (obstructive sleep apnea) - AHI 13.9 on HST in November 2019 (AHI 7.2 in 2016) - Discussed treatment options for obstructive sleep apnea including weight loss, sleep position, dental device, CPAP and surgery. He is interested in  trailing cpap and losing weight for overall health reason.  - Referral for new CPAP start, auto titrate 5-15cm H20, mask of choice, supplies and humidification - Follow up in 31-90 days with Dr. Halford Chessman or NP  BMI 35.0-35.9,adult - Discussed importance of weight loss and increasing physical activity  - His goal weight is 200lbs  - Referral to  healthy weight and wellness      Martyn Ehrich, NP 09/15/2018

## 2018-09-18 NOTE — Progress Notes (Signed)
Reviewed and agree with assessment/plan.   Josia Cueva, MD [facility] 09/29/2016, 12:24 PM Pager:  [phone]  

## 2018-10-10 ENCOUNTER — Encounter: Payer: BLUE CROSS/BLUE SHIELD | Attending: Internal Medicine | Admitting: Dietician

## 2018-10-10 ENCOUNTER — Encounter: Payer: Self-pay | Admitting: Dietician

## 2018-10-10 VITALS — Wt 253.4 lb

## 2018-10-10 DIAGNOSIS — K589 Irritable bowel syndrome without diarrhea: Secondary | ICD-10-CM | POA: Insufficient documentation

## 2018-10-10 DIAGNOSIS — G4733 Obstructive sleep apnea (adult) (pediatric): Secondary | ICD-10-CM | POA: Diagnosis not present

## 2018-10-10 NOTE — Progress Notes (Signed)
Medical Nutrition Therapy  Appt Start Time: 8:05am    End Time: 8:50am  Primary concerns today: more protein in diet, weight management, avoid prediabetes, identify food sensitivities  Preferred learning style: no preference indicated Learning readiness: ready/change in progress   NUTRITION ASSESSMENT   Anthropometrics  Weight: 253 lbs   Clinical Medical Hx: sleep apnea, obesity, IBS, GERD, hyperlipidemia, prediabetes  Surgeries: N/A Allergies: N/A (pt states he suspects food sensitivities) Medications: Lipitor, Astelin, zyrtec, dexilant, bentyl, Flonase, robaxin, naprosyn, Zofran, inhaler   Psychosocial/Lifestyle Pt lives with his wife, with whom he has 3 kids. 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   24-Hr Dietary Recall First Meal: oatmeal + yogurt (or english muffin with egg + canadian bacon) + coffee  Snack: none Second Meal: sandwich (bread + meat + lettuce) + chips (or salad) Snack: none Third Meal: chicken/fish + starch (rice or pasta) + frozen Snack: none Beverages: water + coffee  Food & Nutrition Related Hx Dietary Hx: Pt states he typically eats relatively small meals throughout the day. Pt states he often doesn't finish his entire lunch meal his wife packs for him, so he will eat the rest later as a snack. Pt states he avoids red meat because it sits heavily in his stomach. Pt states that irritating foods include tomatoes, spicy foods, high fat foods, and some sugars. Pt states he has tried avoiding gluten recently to see if this helps alleviate his symptoms. Pt states he tries not to eat after 8pm. Pt states he takes Align Probiotics and eats certain brands of yogurt that contain probiotics. Pt states he has seen a nutritionist before (who visits their worksite) and has learned about nutrition there. Additionally, pt states he teaches My[employer] to his scouts. Pt is already knowledgeable about nutrition and energy intake, and states he believes 2000 kcals is  likely not enough for him.  Estimated Daily Fluid Intake: 64+ oz GI / Other Notable Symptoms: bloating, heavy/filled stomach feeling, constipation sometimes   Physical Activity  Current average weekly physical activity: walking throughout the week  Estimated Energy Needs Calories: 2500 Carbohydrate: 280g Protein: 188g Fat: 70g    NUTRITION DIAGNOSIS  Altered gastrointestinal (GI) function ([place]-1.4) related to alteration in GI tract structure and/or function as evidenced by avoidance/limited intake of specific foods/food groups due to GI symptoms (bloating, stomach pain, and constipation) especially following ingestion of food and hx of irritable bowel syndrome.    NUTRITION INTERVENTION  Nutrition education (E-1) on the following topics:  . General healthful diet: MyPlate review, food groups, balanced eating, focus on fruits/vegetables/whole grains/low-fat dairy/lean meats . Prediabetes: types of diabetes, glucose management tips (diet, exercise, weight, etc.), balanced snack ideas (protein + carbohydrates)  . GERD: avoid irritating foods, stay upright after eating, avoid large meals, avoid greasy foods . IBS: avoid foods known to cause symptoms, consider the low FODMAP diet (avoid FODMAPs, or certain types of sugars), probiotics   Handouts Provided Include   MyPlate   Meal Ideas   Low Carbohydrate Snack Suggestions   Diabetes Types & Glucose Control   Your Happy Gut Guide (emailed)   Healthy Eating Lifestyle with IBS (emailed)  Learning Style & Readiness for Change Teaching method utilized: Visual & Auditory  Demonstrated degree of understanding via: Teach Back  Barriers to learning/adherence to lifestyle change: None Identified   MONITORING & EVALUATION Dietary intake, weekly physical activity, and GI symptoms as desired by patient.   Next Steps  Patient is to call [facility]  to schedule a follow up appointment in the future as needed/desired or as recommended per MD.

## 2018-10-11 ENCOUNTER — Ambulatory Visit: Payer: BLUE CROSS/BLUE SHIELD | Admitting: Internal Medicine

## 2018-10-11 DIAGNOSIS — Z713 Dietary counseling and surveillance: Secondary | ICD-10-CM | POA: Diagnosis not present

## 2018-10-17 ENCOUNTER — Encounter: Payer: Self-pay | Admitting: Internal Medicine

## 2018-10-17 ENCOUNTER — Ambulatory Visit: Payer: BLUE CROSS/BLUE SHIELD | Admitting: Internal Medicine

## 2018-10-17 VITALS — BP 132/68 | HR 68 | Temp 97.8°F | Resp 16 | Ht 72.0 in | Wt 256.5 lb

## 2018-10-17 DIAGNOSIS — R935 Abnormal findings on diagnostic imaging of other abdominal regions, including retroperitoneum: Secondary | ICD-10-CM | POA: Diagnosis not present

## 2018-10-17 DIAGNOSIS — R109 Unspecified abdominal pain: Secondary | ICD-10-CM | POA: Diagnosis not present

## 2018-10-17 DIAGNOSIS — G4733 Obstructive sleep apnea (adult) (pediatric): Secondary | ICD-10-CM

## 2018-10-17 DIAGNOSIS — R1013 Epigastric pain: Secondary | ICD-10-CM | POA: Diagnosis not present

## 2018-10-17 MED ORDER — DEXLANSOPRAZOLE 60 MG PO CPDR: 60.0000 mg | DELAYED_RELEASE_CAPSULE | Freq: Two times a day (BID) | ORAL | 0 refills | Status: DC

## 2018-10-17 NOTE — Progress Notes (Signed)
Pre visit review using our clinic review tool, if applicable. No additional management support is needed unless otherwise documented below in the visit note. 

## 2018-10-17 NOTE — Patient Instructions (Signed)
Please come back in 2 months for recheck  Increase Dexilant to 1 tablet before breakfast and 1 before dinner for 3 weeks, then go back to only 1 tablet daily  We will schedule ultrasound  If your symptoms get worse,  let me know.

## 2018-10-17 NOTE — Progress Notes (Signed)
Subjective:    Patient ID: Wayne Melton, male    DOB: September 01, 1971, 48 y.o.   MRN: [id number]  DOS:  10/17/2018 Type of visit - description:  GI symptoms: Was seen with epigastric pain 09/01/2018, had abdominal pain, nausea, history of IBS. At the time a CMP and CBC were normal He is here today because he is still have symptoms although they have decreased in the last 2 weeks.  Typically he is waken up by some nausea, chills and abdominal discomfort along with some bloating, this happens once or twice a week, last 1 or 2 hours and then he fell asleep again.  Typically the next morning he feels better. Medications reviewed, he takes naproxen very infrequently Good compliance with PPIs  Also, chart reviewed, had sleep study.   Review of Systems Denies fever chills.  No weight loss No vomiting or diarrhea.  No blood in the stools.  Stools are normal in color, BMs every other day. No odynophagia, occasionally has difficulty swallowing solids.  Very rarely has heartburn.  Past Medical History:  Diagnosis Date  . Allergic rhinitis 07/05/2012  . Constipation    chronic  . Diabetes mellitus without complication (HCC)    No meds  . GERD , HH, h/o PUD 07/05/2012  . Hyperlipidemia   . IBS (irritable bowel syndrome) 11/06/2012  . Obesity   . OSA (obstructive sleep apnea)    mild  . Seasonal allergies   . Sleep apnea     Past Surgical History:  Procedure Laterality Date  . COLONOSCOPY    . UPPER GASTROINTESTINAL ENDOSCOPY      Social History   Socioeconomic History  . Marital status: Married    Spouse name: Not on file  . Number of children: 3  . Years of education: Not on file  . Highest education level: Not on file  Occupational History  . Occupation: busines Administrator, computers  Social Needs  . Financial resource strain: Not on file  . Food insecurity:    Worry: Not on file    Inability: Not on file  . Transportation needs:    Medical: Not on file    Non-medical:  Not on file  Tobacco Use  . Smoking status: Former Smoker    Packs/day: 0.50    Years: 3.00    Pack years: 1.50    Types: Cigarettes    Last attempt to quit: 10/04/2008    Years since quitting: 10.0  . Smokeless tobacco: Never Used  Substance and Sexual Activity  . Alcohol use: No    Alcohol/week: 0.0 standard drinks    Comment: rare  . Drug use: No  . Sexual activity: Not on file  Lifestyle  . Physical activity:    Days per week: Not on file    Minutes per session: Not on file  . Stress: Not on file  Relationships  . Social connections:    Talks on phone: Not on file    Gets together: Not on file    Attends religious service: Not on file    Active member of club or organization: Not on file    Attends meetings of clubs or organizations: Not on file    Relationship status: Not on file  . Intimate partner violence:    Fear of current or ex partner: Not on file    Emotionally abused: Not on file    Physically abused: Not on file    Forced sexual activity: Not on file  Other  Topics Concern  . Not on file  Social History Narrative   Household: pt, wife,    2001, 2007, 2008          Allergies as of 10/17/2018   No Known Allergies     Medication List       Accurate as of October 17, 2018  3:59 PM. Always use your most recent med list.        acetaminophen 500 MG tablet Commonly known as:  TYLENOL Take 1,000 mg by mouth every 6 (six) hours as needed for moderate pain.   AMBULATORY NON FORMULARY MEDICATION Medication Name: FDgard take 1 to 2 before meals as needed   aspirin EC 81 MG tablet Take 81 mg by mouth daily.   atorvastatin 40 MG tablet Commonly known as:  LIPITOR Take 1 tablet (40 mg total) by mouth at bedtime.   azelastine 0.1 % nasal spray Commonly known as:  ASTELIN Place 2 sprays into both nostrils at bedtime as needed for rhinitis. Use in each nostril as directed   beclomethasone 80 MCG/ACT inhaler Commonly known as:  QVAR REDIHALER Inhale 2  puffs into the lungs daily.   cetirizine 10 MG tablet Commonly known as:  ZYRTEC Take 10 mg by mouth daily.   dexlansoprazole 60 MG capsule Commonly known as:  DEXILANT Take 1 capsule (60 mg total) by mouth daily.   dicyclomine 20 MG tablet Commonly known as:  BENTYL Take 1 tablet (20 mg total) by mouth 3 (three) times daily before meals.   fluticasone 50 MCG/ACT nasal spray Commonly known as:  FLONASE Place 2 sprays into both nostrils daily.   methocarbamol 500 MG tablet Commonly known as:  ROBAXIN Take 1 tablet (500 mg total) by mouth 2 (two) times daily.   naproxen 375 MG tablet Commonly known as:  NAPROSYN Take 1 tablet (375 mg total) by mouth 2 (two) times daily.   ondansetron 4 MG tablet Commonly known as:  ZOFRAN Take 1 tablet (4 mg total) by mouth every 8 (eight) hours as needed.           Objective:   Physical Exam BP 132/68 (BP Location: Right Arm, Patient Position: Sitting, Cuff Size: Normal)   Pulse 68   Temp 97.8 F (36.6 C) (Oral)   Resp 16   Ht 6' (1.829 m)   Wt 256 lb 8 oz (116.3 kg)   SpO2 98%   BMI 34.79 kg/m  General:   Well developed, NAD, BMI noted.  HEENT:  Normocephalic . Face symmetric, atraumatic Lungs:  CTA B Normal respiratory effort, no intercostal retractions, no accessory muscle use. Heart: RRR,  no murmur.  no pretibial edema bilaterally  Abdomen:  Not distended, soft, non-tender. No rebound or rigidity.   Skin: Not pale. Not jaundice Neurologic:  alert & oriented X3.  Speech normal, gait appropriate for age and unassisted Psych--  Cognition and judgment appear intact.  Cooperative with normal attention span and concentration.  Behavior appropriate. No anxious or depressed appearing.     Assessment     Assessment DM Hyperlipidemia Bronchospasm (on Qvar) GI: --History of PUD (EGD @ [facility]  2002: neg  but reports had another EGD with ulcer) --IBS, occ constipation --GERD -- fatty liver per u/s 06-2015 -EGD  08-2017: Gastritis, polyps, benign findings per GI letter, no follow-up OSA:  (-)  home sleep apnea test 2014,  Full sleep study~08-2015 : mild OSA, saw pulmonary, rx dental appliance, wt loss HST 08-2018: Rx CPAP - neg stress  test 03/2015  PLAN Abdominal pain: Upper abdominal pain, episodic as described above, associated with nausea but no red flag symptoms except for sporadic dysphagia to solids. Last EGD 08-2017: Benign, see report. Plan: Get a ultrasound of the abdomen, increase PPIs to twice a day for 3 weeks, reassess in 2 months.  Further eval if necessary. OSA: Since the last visit, saw pulmonology, had a sleep study 08-2018, Rx a CPAP.  RTC 2 months

## 2018-10-18 NOTE — Assessment & Plan Note (Signed)
Abdominal pain: Upper abdominal pain, episodic as described above, associated with nausea but no red flag symptoms except for sporadic dysphagia to solids. Last EGD 08-2017: Benign, see report. Plan: Get a ultrasound of the abdomen, increase PPIs to twice a day for 3 weeks, reassess in 2 months.  Further eval if necessary. OSA: Since the last visit, saw pulmonology, had a sleep study 08-2018, Rx a CPAP.  RTC 2 months

## 2018-10-23 ENCOUNTER — Ambulatory Visit (HOSPITAL_BASED_OUTPATIENT_CLINIC_OR_DEPARTMENT_OTHER)
Admission: RE | Admit: 2018-10-23 | Discharge: 2018-10-23 | Disposition: A | Discharge status: Home / Self Care | Payer: BLUE CROSS/BLUE SHIELD | Source: Ambulatory Visit | Attending: Internal Medicine | Admitting: Internal Medicine

## 2018-10-23 DIAGNOSIS — R1013 Epigastric pain: Secondary | ICD-10-CM | POA: Diagnosis not present

## 2018-10-23 DIAGNOSIS — K760 Fatty (change of) liver, not elsewhere classified: Secondary | ICD-10-CM | POA: Diagnosis not present

## 2018-10-25 ENCOUNTER — Other Ambulatory Visit: Payer: Self-pay

## 2018-10-25 DIAGNOSIS — E119 Type 2 diabetes mellitus without complications: Secondary | ICD-10-CM

## 2018-10-25 NOTE — Progress Notes (Signed)
CMP needed for MRI abdomen w/ and w/o. Order placed.

## 2018-10-25 NOTE — Addendum Note (Signed)
Addended byDamita Dunnings D on: 10/25/2018 01:08 PM   Modules accepted: Orders

## 2018-10-26 ENCOUNTER — Other Ambulatory Visit: Payer: Self-pay | Admitting: Internal Medicine

## 2018-10-26 ENCOUNTER — Encounter: Payer: Self-pay | Admitting: Internal Medicine

## 2018-10-31 ENCOUNTER — Other Ambulatory Visit: Payer: Self-pay

## 2018-10-31 ENCOUNTER — Other Ambulatory Visit (INDEPENDENT_AMBULATORY_CARE_PROVIDER_SITE_OTHER): Payer: BLUE CROSS/BLUE SHIELD

## 2018-10-31 DIAGNOSIS — E119 Type 2 diabetes mellitus without complications: Secondary | ICD-10-CM

## 2018-10-31 LAB — COMPREHENSIVE METABOLIC PANEL
ALT: 28 U/L (ref 0–53)
AST: 10 U/L (ref 0–37)
Albumin: 4.1 g/dL (ref 3.5–5.2)
Alkaline Phosphatase: 83 U/L (ref 39–117)
BILIRUBIN TOTAL: 0.6 mg/dL (ref 0.2–1.2)
BUN: 11 mg/dL (ref 6–23)
CO2: 27 mEq/L (ref 19–32)
CREATININE: 0.97 mg/dL (ref 0.40–1.50)
Calcium: 9.1 mg/dL (ref 8.4–10.5)
Chloride: 102 mEq/L (ref 96–112)
GFR: 83.57 mL/min (ref >60.00)
Glucose, Bld: 113 mg/dL — ABNORMAL HIGH (ref 70–99)
Potassium: 3.9 mEq/L (ref 3.5–5.1)
Sodium: 137 mEq/L (ref 135–145)
Total Protein: 6.4 g/dL (ref 6.0–8.3)

## 2018-10-31 MED ORDER — DEXLANSOPRAZOLE 60 MG PO CPDR: 60.0000 mg | DELAYED_RELEASE_CAPSULE | Freq: Two times a day (BID) | ORAL | 3 refills | Status: DC

## 2018-11-02 DIAGNOSIS — Z713 Dietary counseling and surveillance: Secondary | ICD-10-CM | POA: Diagnosis not present

## 2018-11-04 ENCOUNTER — Emergency Department (HOSPITAL_BASED_OUTPATIENT_CLINIC_OR_DEPARTMENT_OTHER): Payer: BLUE CROSS/BLUE SHIELD

## 2018-11-04 ENCOUNTER — Encounter (HOSPITAL_BASED_OUTPATIENT_CLINIC_OR_DEPARTMENT_OTHER): Payer: Self-pay | Admitting: Emergency Medicine

## 2018-11-04 ENCOUNTER — Ambulatory Visit (HOSPITAL_BASED_OUTPATIENT_CLINIC_OR_DEPARTMENT_OTHER): Admission: RE | Admit: 2018-11-04 | Payer: BLUE CROSS/BLUE SHIELD | Source: Ambulatory Visit

## 2018-11-04 ENCOUNTER — Emergency Department (HOSPITAL_BASED_OUTPATIENT_CLINIC_OR_DEPARTMENT_OTHER)
Admission: EM | Admit: 2018-11-04 | Discharge: 2018-11-04 | Disposition: A | Discharge status: Home / Self Care | Payer: BLUE CROSS/BLUE SHIELD | Attending: Emergency Medicine | Admitting: Emergency Medicine

## 2018-11-04 ENCOUNTER — Telehealth: Payer: Self-pay | Admitting: Internal Medicine

## 2018-11-04 ENCOUNTER — Other Ambulatory Visit: Payer: Self-pay

## 2018-11-04 DIAGNOSIS — K2900 Acute gastritis without bleeding: Secondary | ICD-10-CM

## 2018-11-04 DIAGNOSIS — Z79899 Other long term (current) drug therapy: Secondary | ICD-10-CM | POA: Insufficient documentation

## 2018-11-04 DIAGNOSIS — R109 Unspecified abdominal pain: Secondary | ICD-10-CM | POA: Diagnosis not present

## 2018-11-04 DIAGNOSIS — K760 Fatty (change of) liver, not elsewhere classified: Secondary | ICD-10-CM | POA: Diagnosis not present

## 2018-11-04 DIAGNOSIS — Z87891 Personal history of nicotine dependence: Secondary | ICD-10-CM | POA: Insufficient documentation

## 2018-11-04 DIAGNOSIS — E119 Type 2 diabetes mellitus without complications: Secondary | ICD-10-CM | POA: Diagnosis not present

## 2018-11-04 DIAGNOSIS — Z7982 Long term (current) use of aspirin: Secondary | ICD-10-CM | POA: Diagnosis not present

## 2018-11-04 DIAGNOSIS — R1013 Epigastric pain: Secondary | ICD-10-CM | POA: Diagnosis not present

## 2018-11-04 LAB — CBC WITH DIFFERENTIAL/PLATELET
ABS IMMATURE GRANULOCYTES: 0.02 10*3/uL (ref 0.00–0.07)
Basophils Absolute: 0 10*3/uL (ref 0.0–0.1)
Basophils Relative: 0 %
Eosinophils Absolute: 0 10*3/uL (ref 0.0–0.5)
Eosinophils Relative: 0 %
HCT: 46.1 % (ref 39.0–52.0)
HEMOGLOBIN: 15.3 g/dL (ref 13.0–17.0)
Immature Granulocytes: 0 %
LYMPHS PCT: 5 %
Lymphs Abs: 0.5 10*3/uL — ABNORMAL LOW (ref 0.7–4.0)
MCH: 29.5 pg (ref 26.0–34.0)
MCHC: 33.2 g/dL (ref 30.0–36.0)
MCV: 89 fL (ref 80.0–100.0)
MONO ABS: 0.3 10*3/uL (ref 0.1–1.0)
MONOS PCT: 3 %
Neutro Abs: 8.6 10*3/uL — ABNORMAL HIGH (ref 1.7–7.7)
Neutrophils Relative %: 92 %
Platelets: 261 10*3/uL (ref 150–400)
RBC: 5.18 MIL/uL (ref 4.22–5.81)
RDW: 12.1 % (ref 11.5–15.5)
WBC: 9.5 10*3/uL (ref 4.0–10.5)
nRBC: 0 % (ref 0.0–0.2)

## 2018-11-04 LAB — COMPREHENSIVE METABOLIC PANEL
ALT: 36 U/L (ref 0–44)
AST: 13 U/L — ABNORMAL LOW (ref 15–41)
Albumin: 4 g/dL (ref 3.5–5.0)
Alkaline Phosphatase: 83 U/L (ref 38–126)
Anion gap: 9 (ref 5–15)
BUN: 14 mg/dL (ref 6–20)
CO2: 23 mmol/L (ref 22–32)
Calcium: 8.7 mg/dL — ABNORMAL LOW (ref 8.9–10.3)
Chloride: 106 mmol/L (ref 98–111)
Creatinine, Ser: 0.87 mg/dL (ref 0.61–1.24)
GFR calc non Af Amer: >60 mL/min (ref >60)
Glucose, Bld: 135 mg/dL — ABNORMAL HIGH (ref 70–99)
POTASSIUM: 3.6 mmol/L (ref 3.5–5.1)
Sodium: 138 mmol/L (ref 135–145)
Total Bilirubin: 0.9 mg/dL (ref 0.3–1.2)
Total Protein: 7.2 g/dL (ref 6.5–8.1)

## 2018-11-04 LAB — LIPASE, BLOOD: Lipase: 31 U/L (ref 11–51)

## 2018-11-04 MED ORDER — FAMOTIDINE 20 MG PO TABS: 20.0000 mg | ORAL_TABLET | Freq: Two times a day (BID) | ORAL | 0 refills | Status: DC

## 2018-11-04 MED ORDER — HYOSCYAMINE SULFATE SL 0.125 MG SL SUBL: 0.1250 mg | SUBLINGUAL_TABLET | SUBLINGUAL | 0 refills | Status: DC | PRN

## 2018-11-04 MED ORDER — IOPAMIDOL (ISOVUE-300) INJECTION 61%
100.0000 mL | Freq: Once | INTRAVENOUS | Status: AC
  Administered 2018-11-04: 100 mL via INTRAVENOUS

## 2018-11-04 MED ORDER — LIDOCAINE VISCOUS HCL 2 % MT SOLN
15.0000 mL | Freq: Once | OROMUCOSAL | Status: AC
  Administered 2018-11-04: 15 mL via ORAL
  Filled 2018-11-04: qty 15

## 2018-11-04 MED ORDER — ONDANSETRON HCL 4 MG/2ML IJ SOLN
4.0000 mg | Freq: Once | INTRAMUSCULAR | Status: AC
  Administered 2018-11-04: 4 mg via INTRAVENOUS
  Filled 2018-11-04: qty 2

## 2018-11-04 MED ORDER — SODIUM CHLORIDE 0.9 % IV BOLUS
1000.0000 mL | Freq: Once | INTRAVENOUS | Status: AC
  Administered 2018-11-04: 1000 mL via INTRAVENOUS

## 2018-11-04 MED ORDER — HYOSCYAMINE SULFATE 0.125 MG SL SUBL
0.2500 mg | SUBLINGUAL_TABLET | Freq: Once | SUBLINGUAL | Status: AC
  Administered 2018-11-04: 0.25 mg via SUBLINGUAL
  Filled 2018-11-04: qty 2

## 2018-11-04 MED ORDER — MORPHINE SULFATE (PF) 4 MG/ML IV SOLN
4.0000 mg | Freq: Once | INTRAVENOUS | Status: AC
  Administered 2018-11-04: 4 mg via INTRAVENOUS
  Filled 2018-11-04: qty 1

## 2018-11-04 MED ORDER — FAMOTIDINE IN NACL 20-0.9 MG/50ML-% IV SOLN
20.0000 mg | Freq: Once | INTRAVENOUS | Status: AC
  Administered 2018-11-04: 20 mg via INTRAVENOUS
  Filled 2018-11-04: qty 50

## 2018-11-04 MED ORDER — SUCRALFATE 1 GM/10ML PO SUSP: 1.0000 g | Freq: Three times a day (TID) | ORAL | 0 refills | Status: DC

## 2018-11-04 MED ORDER — ALUM & MAG HYDROXIDE-SIMETH 200-200-20 MG/5ML PO SUSP
30.0000 mL | Freq: Once | ORAL | Status: AC
  Administered 2018-11-04: 30 mL via ORAL
  Filled 2018-11-04: qty 30

## 2018-11-04 NOTE — Telephone Encounter (Signed)
Patient went to the ER yesterday with abdominal pain. I called the patient today, still hurting some. The CT was essentially negative, specifically the liver show no focal abnormality (discussed with radiology). Previously ultrasound of the liver was abnormal and he has an abdominal  MRI schedule for today however at this point I am not sure the MRI will provide more information. Plan: I asked the patient to cancel the abdominal MRI for today. I'll ask GI to contact the patient and set up a follow-up. Patient in agreement

## 2018-11-04 NOTE — ED Provider Notes (Signed)
[facility] EMERGENCY DEPARTMENT Provider Note   CSN: [id number] Arrival date & time: 11/04/18  3474     History   Chief Complaint Chief Complaint  Patient presents with  . Abdominal Pain    HPI Wayne Melton is a 48 y.o. male.  Patient presents to the emergency department for evaluation of abdominal pain.  Patient reports that he has been experiencing ongoing issues with abdominal pain for 2 months.  He has a history of peptic ulcer disease as well as gastritis in the past.  His primary doctor did an outpatient ultrasound and has increased his Dexilant.  Patient reports that he started to have increased pain around 6:30 PM and it has been continuous and unrelenting.  He has had nausea but no vomiting.  He has had diarrhea which is unusual for him.  No melanotic stools.     Past Medical History:  Diagnosis Date  . Allergic rhinitis 07/05/2012  . Constipation    chronic  . Diabetes mellitus without complication (HCC)    No meds  . GERD , HH, h/o PUD 07/05/2012  . Hyperlipidemia   . IBS (irritable bowel syndrome) 11/06/2012  . Obesity   . OSA (obstructive sleep apnea)    mild  . Seasonal allergies   . Sleep apnea     Patient Active Problem List   Diagnosis Date Noted  . BMI 35.0-35.9,adult 09/15/2018  . OSA (obstructive sleep apnea) 10/14/2015  . Follow-up ---------------PCP NOTES 06/12/2015  . Diabetes mellitus without complication ([place]) 25/95/6387  . Chest pain 02/26/2015  . Fatigue 11/08/2014  . Annual physical exam 11/06/2012  . IBS (irritable bowel syndrome) 11/06/2012  . GERD , HH, h/o PUD 07/05/2012  . Allergic rhinitis 07/05/2012  . Hyperlipemia 07/05/2012    Past Surgical History:  Procedure Laterality Date  . COLONOSCOPY    . UPPER GASTROINTESTINAL ENDOSCOPY          Home Medications    Prior to Admission medications   Medication Sig Start Date End Date Taking? Authorizing Provider  acetaminophen (TYLENOL) 500 MG tablet Take 1,000  mg by mouth every 6 (six) hours as needed for moderate pain.    [provider]  AMBULATORY NON FORMULARY MEDICATION Medication Name: FDgard take 1 to 2 before meals as needed 08/10/17   Armbruster, Carlota Raspberry, MD  aspirin EC 81 MG tablet Take 81 mg by mouth daily.    [provider]  azelastine (ASTELIN) 0.1 % nasal spray Place 2 sprays into both nostrils at bedtime as needed for rhinitis or allergies. Use in each nostril as directed 10/26/18   Colon Branch, MD  beclomethasone (QVAR REDIHALER) 80 MCG/ACT inhaler Inhale 2 puffs into the lungs daily. 02/08/18   Colon Branch, MD  cetirizine (ZYRTEC) 10 MG tablet Take 10 mg by mouth daily.    [provider]  dexlansoprazole (DEXILANT) 60 MG capsule Take 1 capsule (60 mg total) by mouth 2 (two) times daily. 10/31/18   Colon Branch, MD  dicyclomine (BENTYL) 20 MG tablet Take 1 tablet (20 mg total) by mouth 3 (three) times daily before meals. 05/19/18   Shelda Pal, DO  famotidine (PEPCID) 20 MG tablet Take 1 tablet (20 mg total) by mouth 2 (two) times daily. 11/04/18   Orpah Greek, MD  fluticasone (FLONASE) 50 MCG/ACT nasal spray Place 2 sprays into both nostrils daily. 03/16/18   [name], April, MD  Hyoscyamine Sulfate SL (LEVSIN/SL) 0.125 MG SUBL Place 0.125 mg  under the tongue every 4 (four) hours as needed (abd pain). 11/04/18   Orpah Greek, MD  methocarbamol (ROBAXIN) 500 MG tablet Take 1 tablet (500 mg total) by mouth 2 (two) times daily. 03/16/18   [name], April, MD  naproxen (NAPROSYN) 375 MG tablet Take 1 tablet (375 mg total) by mouth 2 (two) times daily. 03/16/18   [name], April, MD  ondansetron (ZOFRAN) 4 MG tablet Take 1 tablet (4 mg total) by mouth every 8 (eight) hours as needed. Patient not taking: Reported on 10/17/2018 05/19/18   Shelda Pal, DO  sucralfate (CARAFATE) 1 GM/10ML suspension Take 10 mLs (1 g total) by mouth 4 (four) times daily -  with meals and at bedtime. 11/04/18    Orpah Greek, MD    Family History Family History  Problem Relation Age of Onset  . Pancreatitis Mother   . Diabetes Mother   . Hyperlipidemia Mother   . Colon polyps Mother        late 69s  . Mitral valve prolapse Mother   . Colon cancer Maternal Uncle        uncle in his 94s  . Epilepsy Father   . Colon cancer Maternal Grandfather   . CAD Other        GF in his 53s  . Irritable bowel syndrome Other   . Colitis Other   . Heart disease Other   . Prostate cancer Neg Hx     Social History Social History   Tobacco Use  . Smoking status: Former Smoker    Packs/day: 0.50    Years: 3.00    Pack years: 1.50    Types: Cigarettes    Last attempt to quit: 10/04/2008    Years since quitting: 10.0  . Smokeless tobacco: Never Used  Substance Use Topics  . Alcohol use: No    Alcohol/week: 0.0 standard drinks    Comment: rare  . Drug use: No     Allergies   Patient has no known allergies.   Review of Systems Review of Systems  Gastrointestinal: Positive for abdominal pain, diarrhea and nausea.  All other systems reviewed and are negative.    Physical Exam Updated Vital Signs BP 116/80   Pulse 87   Temp 98.3 F (36.8 C)   Resp 16   Ht 6' (1.829 m)   Wt 113.4 kg   SpO2 98%   BMI 33.91 kg/m   Physical Exam Vitals signs and nursing note reviewed.  Constitutional:      General: He is not in acute distress.    Appearance: Normal appearance. He is well-developed.  HENT:     Head: Normocephalic and atraumatic.     Right Ear: Hearing normal.     Left Ear: Hearing normal.     Nose: Nose normal.  Eyes:     Conjunctiva/sclera: Conjunctivae normal.     Pupils: Pupils are equal, round, and reactive to light.  Neck:     Musculoskeletal: Normal range of motion and neck supple.  Cardiovascular:     Rate and Rhythm: Regular rhythm.     Heart sounds: S1 normal and S2 normal. No murmur. No friction rub. No gallop.   Pulmonary:     Effort: Pulmonary effort  is normal. No respiratory distress.     Breath sounds: Normal breath sounds.  Chest:     Chest wall: No tenderness.  Abdominal:     General: Bowel sounds are normal.     Palpations: Abdomen  is soft.     Tenderness: There is abdominal tenderness in the epigastric area. There is no guarding or rebound. Negative signs include Murphy's sign and McBurney's sign.     Hernia: No hernia is present.  Musculoskeletal: Normal range of motion.  Skin:    General: Skin is warm and dry.     Findings: No rash.  Neurological:     Mental Status: He is alert and oriented to person, place, and time.     GCS: GCS eye subscore is 4. GCS verbal subscore is 5. GCS motor subscore is 6.     Cranial Nerves: No cranial nerve deficit.     Sensory: No sensory deficit.     Coordination: Coordination normal.  Psychiatric:        Speech: Speech normal.        Behavior: Behavior normal.        Thought Content: Thought content normal.      ED Treatments / Results  Labs (all labs ordered are listed, but only abnormal results are displayed) Labs Reviewed  CBC WITH DIFFERENTIAL/PLATELET - Abnormal; Notable for the following components:      Result Value   Neutro Abs 8.6 (*)    Lymphs Abs 0.5 (*)    All other components within normal limits  COMPREHENSIVE METABOLIC PANEL - Abnormal; Notable for the following components:   Glucose, Bld 135 (*)    Calcium 8.7 (*)    AST 13 (*)    All other components within normal limits  LIPASE, BLOOD    EKG None  Radiology Ct Abdomen Pelvis W Contrast  Result Date: 11/04/2018 CLINICAL DATA:  Acute on chronic epigastric pain. EXAM: CT ABDOMEN AND PELVIS WITH CONTRAST TECHNIQUE: Multidetector CT imaging of the abdomen and pelvis was performed using the standard protocol following bolus administration of intravenous contrast. CONTRAST:  153mL ISOVUE-300 IOPAMIDOL (ISOVUE-300) INJECTION 61% COMPARISON:  Right upper quadrant ultrasound dated October 23, 2018. FINDINGS: Lower  chest: No acute abnormality. Hepatobiliary: Decreased hepatic attenuation. No focal liver abnormality is seen. No gallstones, gallbladder wall thickening, or biliary dilatation. Pancreas: Unremarkable. No pancreatic ductal dilatation or surrounding inflammatory changes. Spleen: Normal in size without focal abnormality. Adrenals/Urinary Tract: Adrenal glands are unremarkable. Kidneys are normal, without renal calculi, focal lesion, or hydronephrosis. Bladder is unremarkable. Stomach/Bowel: Distended stomach. Appendix appears normal. No evidence of bowel wall thickening, distention, or inflammatory changes. Vascular/Lymphatic: No significant vascular findings are present. No enlarged abdominal or pelvic lymph nodes. Reproductive: Prostate is unremarkable. Other: Tiny fat containing umbilical hernia. No free fluid or pneumoperitoneum. Musculoskeletal: No acute or significant osseous findings. Moderate degenerative disc disease at L5-S1. IMPRESSION: 1.  No acute intra-abdominal process. 2. Hepatic steatosis. Electronically Signed   By: Titus Dubin M.D.   On: 11/04/2018 06:52    Procedures Procedures (including critical care time)  Medications Ordered in ED Medications  sodium chloride 0.9 % bolus 1,000 mL ( Intravenous Stopped 11/04/18 0614)  ondansetron (ZOFRAN) injection 4 mg (4 mg Intravenous Given 11/04/18 0501)  morphine 4 MG/ML injection 4 mg (4 mg Intravenous Given 11/04/18 0501)  famotidine (PEPCID) IVPB 20 mg premix ( Intravenous Stopped 11/04/18 0526)  alum & mag hydroxide-simeth (MAALOX/MYLANTA) 200-200-20 MG/5ML suspension 30 mL (30 mLs Oral Given 11/04/18 0500)    And  lidocaine (XYLOCAINE) 2 % viscous mouth solution 15 mL (15 mLs Oral Given 11/04/18 0500)  hyoscyamine (LEVSIN SL) SL tablet 0.25 mg (0.25 mg Sublingual Given 11/04/18 0501)  iopamidol (ISOVUE-300) 61 % injection 100  mL (100 mLs Intravenous Contrast Given 11/04/18 0550)     Initial Impression / Assessment and Plan / ED Course  I have  reviewed the triage vital signs and the nursing notes.  Pertinent labs & imaging results that were available during my care of the patient were reviewed by me and considered in my medical decision making (see chart for details).     Patient presents to emergency department for evaluation of abdominal pain.  Patient has had problems with epigastric pain since November.  He has a history of peptic ulcer disease.  His primary care doctor has increased his Dexilant.  He reports that it helped for a few days but then the pain came back.  Overnight his pain worsened.  Pain was epigastric.  He has recently had an ultrasound that did not show any gallbladder disease.  His lab work was normal tonight.  He was sent for CT scan to further evaluate intra-abdominal pathology.  CT scan was unremarkable.  Will add Pepcid, Carafate, Levsin to his Dexilant, follow-up with PCP and his gastroenterologist.  Final Clinical Impressions(s) / ED Diagnoses   Final diagnoses:  Acute gastritis without hemorrhage, unspecified gastritis type    ED Discharge Orders         Ordered    sucralfate (CARAFATE) 1 GM/10ML suspension  3 times daily with meals & bedtime     11/04/18 0658    famotidine (PEPCID) 20 MG tablet  2 times daily     11/04/18 0658    Hyoscyamine Sulfate SL (LEVSIN/SL) 0.125 MG SUBL  Every 4 hours PRN     11/04/18 0658           Orpah Greek, MD 11/04/18 [phone]

## 2018-11-04 NOTE — ED Triage Notes (Signed)
Epigastric abd pain since November and worsened tonight.

## 2018-11-06 ENCOUNTER — Other Ambulatory Visit: Payer: Self-pay | Admitting: Internal Medicine

## 2018-11-06 ENCOUNTER — Encounter: Payer: Self-pay | Admitting: Internal Medicine

## 2018-11-06 ENCOUNTER — Telehealth: Payer: Self-pay

## 2018-11-06 MED ORDER — FAMOTIDINE 20 MG PO TABS: 20.0000 mg | ORAL_TABLET | Freq: Two times a day (BID) | ORAL | 5 refills | Status: DC

## 2018-11-06 NOTE — Telephone Encounter (Signed)
-----   Message from Yetta Flock, MD sent at 11/06/2018  7:49 AM EST ----- Regarding: FW: please re assess pt Jan please see below. Can you help coordinate an office follow up for this patient? Thanks much ----- Message ----- From: Colon Branch, MD Sent: 11/04/2018  12:09 PM EST To: Colon Branch, MD, Yetta Flock, MD Subject: please re assess pt                            Remo Lipps, could you see this pt again? He continues w/ upper abd pain , had an abnormal liver US but the CT liver was non focal. Thank you JP

## 2018-11-06 NOTE — Telephone Encounter (Signed)
Called and spoke to pt. First available appt is 2-24 with Dr. Havery Moros. Pt agreed to 2-24 appt and did not want to schedule sooner with an APP.  Pt had U/S 10-23-18 and CT on 11-04-18

## 2018-11-10 DIAGNOSIS — G4733 Obstructive sleep apnea (adult) (pediatric): Secondary | ICD-10-CM | POA: Diagnosis not present

## 2018-11-13 ENCOUNTER — Other Ambulatory Visit: Payer: Self-pay

## 2018-11-13 MED ORDER — DEXLANSOPRAZOLE 60 MG PO CPDR: 60.0000 mg | DELAYED_RELEASE_CAPSULE | Freq: Two times a day (BID) | ORAL | 3 refills | Status: DC

## 2018-11-27 ENCOUNTER — Encounter: Payer: Self-pay | Admitting: Gastroenterology

## 2018-11-27 ENCOUNTER — Ambulatory Visit: Payer: BLUE CROSS/BLUE SHIELD | Admitting: Gastroenterology

## 2018-11-27 VITALS — BP 114/60 | HR 72 | Ht 71.5 in | Wt 254.0 lb

## 2018-11-27 DIAGNOSIS — K219 Gastro-esophageal reflux disease without esophagitis: Secondary | ICD-10-CM

## 2018-11-27 DIAGNOSIS — E669 Obesity, unspecified: Secondary | ICD-10-CM

## 2018-11-27 DIAGNOSIS — Z6834 Body mass index (BMI) 34.0-34.9, adult: Secondary | ICD-10-CM

## 2018-11-27 DIAGNOSIS — K760 Fatty (change of) liver, not elsewhere classified: Secondary | ICD-10-CM | POA: Diagnosis not present

## 2018-11-27 DIAGNOSIS — R1013 Epigastric pain: Secondary | ICD-10-CM

## 2018-11-27 MED ORDER — ONDANSETRON 4 MG PO TBDP: 4.0000 mg | ORAL_TABLET | Freq: Four times a day (QID) | ORAL | 3 refills | Status: DC | PRN

## 2018-11-27 NOTE — Patient Instructions (Signed)
If you are age 46 or older, your body mass index should be between 23-30. Your Body mass index is 34.93 kg/m. If this is out of the aforementioned range listed, please consider follow up with your Primary Care Provider.  If you are age 30 or younger, your body mass index should be between 19-25. Your Body mass index is 34.93 kg/m. If this is out of the aformentioned range listed, please consider follow up with your Primary Care Provider.   We have sent the following medications to your pharmacy for you to pick up at your convenience: Zofran 4 mg ODT: Take every 6 hours as needed  Thank you for entrusting me with your care and for choosing [facility], Dr. Webster Cellar

## 2018-11-27 NOTE — Progress Notes (Signed)
HPI :  48 year old male here for follow-up visit. He is been seen previously for symptoms of reflux and dyspepsia.  He has a history of an EGD November 2018 showing a few small linear based ulcerations and benign gastric polyps. Biopsies negative for H. Pylori. He had no esophagitis at the time. This was done on Protonix 40 mg twice daily. He was transitioned to Dexilant 60 mg a day which initially helped him quite a bit. He states since the end of Thanksgiving his symptoms have recurred and not as well controlled. He had some periodic nausea, and some episodic postprandial epigastric discomfort. He also has some bloating and reflux symptoms of bother him. He has been eating less due to early satiety as well. He has a history of diabetes but is not on any medication for her, last A1c 6.3. He states he's had some episodic periods where his symptoms are much worse. Unfortunately he went to emergency department in the beginning of February for worsening symptoms. He had a CT scan done at the time which showed hepatic steatosis but no other pathology to cause the symptoms. He previously had an ultrasound done by his primary care to ensure no gallstones. None were seen, there was a question of to hypercaloric liver lesions suspicious for benign focal fatty infiltration. CT did not show any other pathology in the liver.   He denies any NSAID use. The ER had given him some Pepcid at 20 mg twice a day as well as some Carafate to take. He reports he is about 90% improved at this time and seems back to his previous baseline. We have previously discussed a trial of FD guard which she took in the past, he is not sure how much it helped him.  In regards to the fatty liver, he denies any significant changes in his weight. His BMI is about 35. He denies any alcohol use. His liver enzymes have remained normal in recent years.  EGD 08/16/2017 - few small linear based ulcers, normal esophagus, multiple small gastric  polyps, - benign biopsies Colonoscopy 12/09/15 - 29mm transverse TA, otherwise normal exam -   US abdomen 10/23/18 - diffuse hepatic steatosis, Two hypoechoic liver masses which were not definitely seen on previous study, but are suspicious for areas of benign focal fatty sparing.  CT abdomen / pelvis - 11/04/18 - IMPRESSION: 1.  No acute intra-abdominal process. 2. Hepatic steatosis.   Past Medical History:  Diagnosis Date  . Allergic rhinitis 07/05/2012  . Constipation    chronic  . Diabetes mellitus without complication (HCC)    No meds  . GERD , HH, h/o PUD 07/05/2012  . Hyperlipidemia   . IBS (irritable bowel syndrome) 11/06/2012  . Obesity   . OSA (obstructive sleep apnea)    mild  . Seasonal allergies   . Sleep apnea      Past Surgical History:  Procedure Laterality Date  . COLONOSCOPY    . UPPER GASTROINTESTINAL ENDOSCOPY     Family History  Problem Relation Age of Onset  . Pancreatitis Mother   . Diabetes Mother   . Hyperlipidemia Mother   . Colon polyps Mother        late 51s  . Mitral valve prolapse Mother   . Colon cancer Maternal Uncle        uncle in his 54s  . Epilepsy Father   . Colon cancer Maternal Grandfather   . CAD Other        GF  in his 67s  . Irritable bowel syndrome Other   . Colitis Other   . Heart disease Other   . Prostate cancer Neg Hx    Social History   Tobacco Use  . Smoking status: Former Smoker    Packs/day: 0.50    Years: 3.00    Pack years: 1.50    Types: Cigarettes    Last attempt to quit: 10/04/2008    Years since quitting: 10.1  . Smokeless tobacco: Never Used  Substance Use Topics  . Alcohol use: No    Alcohol/week: 0.0 standard drinks    Comment: rare  . Drug use: No   Current Outpatient Medications  Medication Sig Dispense Refill  . acetaminophen (TYLENOL) 500 MG tablet Take 1,000 mg by mouth every 6 (six) hours as needed for moderate pain.    Marland Kitchen azelastine (ASTELIN) 0.1 % nasal spray Place 2 sprays into both  nostrils at bedtime as needed for rhinitis or allergies. Use in each nostril as directed 30 mL 5  . beclomethasone (QVAR REDIHALER) 80 MCG/ACT inhaler Inhale 2 puffs into the lungs daily. 31.8 g 1  . cetirizine (ZYRTEC) 10 MG tablet Take 10 mg by mouth daily.    Marland Kitchen dexlansoprazole (DEXILANT) 60 MG capsule Take 1 capsule (60 mg total) by mouth 2 (two) times daily. (Patient taking differently: Take 60 mg by mouth daily. ) 180 capsule 3  . famotidine (PEPCID) 20 MG tablet Take 1 tablet (20 mg total) by mouth 2 (two) times daily. (Patient taking differently: Take 20 mg by mouth as needed. ) 30 tablet 5  . fluticasone (FLONASE) 50 MCG/ACT nasal spray Place 2 sprays into both nostrils daily. 16 g 0  . naproxen (NAPROSYN) 375 MG tablet Take 1 tablet (375 mg total) by mouth 2 (two) times daily. 20 tablet 0  . Hyoscyamine Sulfate SL (LEVSIN/SL) 0.125 MG SUBL Place 0.125 mg under the tongue every 4 (four) hours as needed (abd pain). (Patient not taking: Reported on 11/27/2018) 120 each 0   No current facility-administered medications for this visit.    No Known Allergies   Review of Systems: All systems reviewed and negative except where noted in HPI.    Ct Abdomen Pelvis W Contrast  Result Date: 11/04/2018 CLINICAL DATA:  Acute on chronic epigastric pain. EXAM: CT ABDOMEN AND PELVIS WITH CONTRAST TECHNIQUE: Multidetector CT imaging of the abdomen and pelvis was performed using the standard protocol following bolus administration of intravenous contrast. CONTRAST:  161mL ISOVUE-300 IOPAMIDOL (ISOVUE-300) INJECTION 61% COMPARISON:  Right upper quadrant ultrasound dated October 23, 2018. FINDINGS: Lower chest: No acute abnormality. Hepatobiliary: Decreased hepatic attenuation. No focal liver abnormality is seen. No gallstones, gallbladder wall thickening, or biliary dilatation. Pancreas: Unremarkable. No pancreatic ductal dilatation or surrounding inflammatory changes. Spleen: Normal in size without focal  abnormality. Adrenals/Urinary Tract: Adrenal glands are unremarkable. Kidneys are normal, without renal calculi, focal lesion, or hydronephrosis. Bladder is unremarkable. Stomach/Bowel: Distended stomach. Appendix appears normal. No evidence of bowel wall thickening, distention, or inflammatory changes. Vascular/Lymphatic: No significant vascular findings are present. No enlarged abdominal or pelvic lymph nodes. Reproductive: Prostate is unremarkable. Other: Tiny fat containing umbilical hernia. No free fluid or pneumoperitoneum. Musculoskeletal: No acute or significant osseous findings. Moderate degenerative disc disease at L5-S1. IMPRESSION: 1.  No acute intra-abdominal process. 2. Hepatic steatosis. Electronically Signed   By: Titus Dubin M.D.   On: 11/04/2018 06:52   Lab Results  Component Value Date   WBC 9.5 11/04/2018   HGB  15.3 11/04/2018   HCT 46.1 11/04/2018   MCV 89.0 11/04/2018   PLT 261 11/04/2018    Lab Results  Component Value Date   CREATININE 0.87 11/04/2018   BUN 14 11/04/2018   NA 138 11/04/2018   K 3.6 11/04/2018   CL 106 11/04/2018   CO2 23 11/04/2018    Lab Results  Component Value Date   ALT 36 11/04/2018   AST 13 (L) 11/04/2018   ALKPHOS 83 11/04/2018   BILITOT 0.9 11/04/2018      Physical Exam: BP 114/60 (BP Location: Left Arm, Patient Position: Sitting, Cuff Size: Normal)   Pulse 72   Ht 5' 11.5" (1.816 m) Comment: height measured without shoes  Wt 254 lb (115.2 kg)   BMI 34.93 kg/m  Constitutional: Pleasant,well-developed, male in no acute distress. HEENT: Normocephalic and atraumatic. Conjunctivae are normal. No scleral icterus. Neck supple.  Cardiovascular: Normal rate, regular rhythm.  Pulmonary/chest: Effort normal and breath sounds normal. No wheezing, rales or rhonchi. Abdominal: Soft, nondistended, nontender.  There are no masses palpable. No hepatomegaly. Extremities: no edema Lymphadenopathy: No cervical adenopathy  noted. Neurological: Alert and oriented to person place and time. Skin: Skin is warm and dry. No rashes noted. Psychiatric: Normal mood and affect. Behavior is normal.   ASSESSMENT AND PLAN: 48 year old male here for reassessment the following issues:  GERD / dyspepsia - history as above, initially had done quite well on high dose Dexilant after his last EGD, however started having some breakthrough symptoms significant episodes in recent weeks. Was given higher dose Pepcid and Carafate and symptoms have mostly resolved. Now back to previous baseline although having some early satiety and periodic nausea. [place] and CT are reassuring other than fatty liver. He denies NSAID use. While his DM is mild, gastroparesis is on the differential, versus him having a component of functional dyspepsia. We discussed testing for this with [facility] versus empirically giving him a trial of Reglan, or perhaps trying Buspirone. We also discussed that if his symptoms persist could consider a repeat endoscopy given changes on his last exam despite high dose protonix. After discussion of options he is feeling better on his regimen, he recently stopped carafate and still doing well. He will come off scheduled pepcid and use it PRN. I will give him some Zofran to use PRN for nausea. He wanted to hold off on further testing or trial of other regimens and see how he does, he will keep me posted and call in for advice or follow as needed. Continue Dexilant for now otherwise, discussed risks / benefits. Renal function normal.   Fatty liver / obesity - discussed spectrum of fatty liver disease with him. Fortunately his liver function testing is normal although is at risk for liver disease with imaging findings over time. Recommend weight loss for fatty liver but I also think it will help his reflux and dyspepsia. He will work on this. Recommend following up with me once yearly for this issue. He agreed  Cazenovia Cellar, MD [facility]  Gastroenterology

## 2018-12-09 DIAGNOSIS — G4733 Obstructive sleep apnea (adult) (pediatric): Secondary | ICD-10-CM | POA: Diagnosis not present

## 2018-12-12 ENCOUNTER — Encounter: Payer: Self-pay | Admitting: Internal Medicine

## 2018-12-12 ENCOUNTER — Other Ambulatory Visit: Payer: Self-pay

## 2018-12-12 DIAGNOSIS — R109 Unspecified abdominal pain: Secondary | ICD-10-CM

## 2018-12-13 ENCOUNTER — Other Ambulatory Visit: Payer: Self-pay | Admitting: Gastroenterology

## 2018-12-13 DIAGNOSIS — R5383 Other fatigue: Secondary | ICD-10-CM

## 2018-12-19 ENCOUNTER — Telehealth: Payer: Self-pay | Admitting: Pulmonary Disease

## 2018-12-19 NOTE — Telephone Encounter (Signed)
Called patient x2 and phone rang busy

## 2018-12-20 DIAGNOSIS — Z713 Dietary counseling and surveillance: Secondary | ICD-10-CM | POA: Diagnosis not present

## 2018-12-20 NOTE — Telephone Encounter (Signed)
Called patient 3 times, phone rang once and then I received the busy signal each try. [phone] is the only number we have on file.   Will attempt to call back later.

## 2018-12-21 ENCOUNTER — Other Ambulatory Visit: Payer: Self-pay

## 2018-12-21 ENCOUNTER — Encounter: Payer: Self-pay | Admitting: Medical

## 2018-12-21 ENCOUNTER — Ambulatory Visit: Payer: BLUE CROSS/BLUE SHIELD | Admitting: Medical

## 2018-12-21 VITALS — BP 105/75 | HR 81 | Temp 98.3°F | Resp 16 | Ht 71.5 in | Wt 252.8 lb

## 2018-12-21 DIAGNOSIS — K219 Gastro-esophageal reflux disease without esophagitis: Secondary | ICD-10-CM | POA: Diagnosis not present

## 2018-12-21 DIAGNOSIS — R110 Nausea: Secondary | ICD-10-CM | POA: Diagnosis not present

## 2018-12-21 DIAGNOSIS — R5383 Other fatigue: Secondary | ICD-10-CM

## 2018-12-21 DIAGNOSIS — J301 Allergic rhinitis due to pollen: Secondary | ICD-10-CM

## 2018-12-21 DIAGNOSIS — R142 Eructation: Secondary | ICD-10-CM

## 2018-12-21 LAB — COMPREHENSIVE METABOLIC PANEL
ALT: 34 U/L (ref 0–53)
AST: 10 U/L (ref 0–37)
Albumin: 4.4 g/dL (ref 3.5–5.2)
Alkaline Phosphatase: 96 U/L (ref 39–117)
BUN: 10 mg/dL (ref 6–23)
CO2: 31 mEq/L (ref 19–32)
Calcium: 9.6 mg/dL (ref 8.4–10.5)
Chloride: 103 mEq/L (ref 96–112)
Creatinine, Ser: 1 mg/dL (ref 0.40–1.50)
GFR: 80.63 mL/min (ref >60.00)
GLUCOSE: 88 mg/dL (ref 70–99)
Potassium: 4.7 mEq/L (ref 3.5–5.1)
Sodium: 141 mEq/L (ref 135–145)
Total Bilirubin: 0.4 mg/dL (ref 0.2–1.2)
Total Protein: 7.2 g/dL (ref 6.0–8.3)

## 2018-12-21 LAB — CBC WITH DIFFERENTIAL/PLATELET
Basophils Absolute: 0.1 10*3/uL (ref 0.0–0.1)
Basophils Relative: 0.9 % (ref 0.0–3.0)
Eosinophils Absolute: 0 10*3/uL (ref 0.0–0.7)
Eosinophils Relative: 0.5 % (ref 0.0–5.0)
HCT: 46.1 % (ref 39.0–52.0)
Hemoglobin: 15.8 g/dL (ref 13.0–17.0)
Lymphocytes Relative: 25.7 % (ref 12.0–46.0)
Lymphs Abs: 2.2 10*3/uL (ref 0.7–4.0)
MCHC: 34.4 g/dL (ref 30.0–36.0)
MCV: 88.6 fl (ref 78.0–100.0)
Monocytes Absolute: 0.6 10*3/uL (ref 0.1–1.0)
Monocytes Relative: 6.5 % (ref 3.0–12.0)
Neutro Abs: 5.7 10*3/uL (ref 1.4–7.7)
Neutrophils Relative %: 66.4 % (ref 43.0–77.0)
Platelets: 341 10*3/uL (ref 150.0–400.0)
RBC: 5.2 Mil/uL (ref 4.22–5.81)
RDW: 13.3 % (ref 11.5–15.5)
WBC: 8.6 10*3/uL (ref 4.0–10.5)

## 2018-12-21 LAB — LIPASE: LIPASE: 19 U/L (ref 11.0–59.0)

## 2018-12-21 LAB — AMYLASE: Amylase: 29 U/L (ref 27–131)

## 2018-12-21 LAB — VITAMIN B12: Vitamin B-12: 534 pg/mL (ref 211–911)

## 2018-12-21 MED ORDER — MONTELUKAST SODIUM 10 MG PO TABS: 10.0000 mg | ORAL_TABLET | Freq: Every day | ORAL | 3 refills | Status: DC

## 2018-12-21 MED ORDER — SUCRALFATE 1 G PO TABS: 1.0000 g | ORAL_TABLET | Freq: Three times a day (TID) | ORAL | 3 refills | Status: DC

## 2018-12-21 NOTE — Telephone Encounter (Signed)
ATC pt, phone line rang then I received a busy signal x2. Will try back.

## 2018-12-21 NOTE — Patient Instructions (Addendum)
You do appear to have flare of your reflux type symptoms.  I would recommend that you stay on Dexilant and go ahead and use Pepcid twice daily for the next week.  Also start sucralfate.  We will get labs today to include B12, B1, vitamin D, CBC, CMP and pancreas enzymes.  B vitamins and vitamin D part of work-up for fatigue.  If your symptoms persist despite the above recommendations then would askthat you try to contact your GI MD office and see if they have further recommendations or if they want you to go ahead and get with through the gastric emptying study.  For allergic rhinitis, continue current nasal sprays and Zyrtec.  Went ahead and sent in montelukast added to your regimen.  Follow-up date to be determined based on lab results and your clinical response to recommendations.

## 2018-12-21 NOTE — Progress Notes (Signed)
Subjective:    Patient ID: Wayne Melton, male    DOB: [birth year], 48 y.o.   MRN: [id number]  HPI  Pt in for stomach pain and bloating in abdomen and lower rib areas. He had these symptoms since November. Pt states struggled with this and was evaluated in ED. He has been given pepcid and sucraflate. Pt had CT scans and Ultrasounds done. No acute cause found. He does have fatty liver. Pt states he has gastric emptying study scheduled. He went ahead and canceled that due to concerns about covid virus.  Over last 2 days he has had recurrent symptoms. He is belching, nausea, sour taste in upper esophagus/throat. Pt is on pepcid and dexilant. Dexilant every day. pepcid more for prn.  Pt is using levsin off and on. Has zofran for nausea.  Pt has been on sucraflate in the past and he states it seemed to help.  No fever, no chills or sweats. He has some muscle aches.   GI MD note on 11-27-2018  ASSESSMENT AND PLAN: 48 year old male here for reassessment the following issues:  GERD / dyspepsia - history as above, initially had done quite well on high dose Dexilant after his last EGD, however started having some breakthrough symptoms significant episodes in recent weeks. Was given higher dose Pepcid and Carafate and symptoms have mostly resolved. Now back to previous baseline although having some early satiety and periodic nausea. [place] and CT are reassuring other than fatty liver. He denies NSAID use. While his DM is mild, gastroparesis is on the differential, versus him having a component of functional dyspepsia. We discussed testing for this with [facility]ES versus empirically giving him a trial of Reglan, or perhaps trying Buspirone. We also discussed that if his symptoms persist could consider a repeat endoscopy given changes on his last exam despite high dose protonix. After discussion of options he is feeling better on his regimen, he recently stopped carafate and still doing well. He will come off  scheduled pepcid and use it PRN. I will give him some Zofran to use PRN for nausea. He wanted to hold off on further testing or trial of other regimens and see how he does, he will keep me posted and call in for advice or follow as needed. Continue Dexilant for now otherwise, discussed risks / benefits. Renal function normal.     Review of Systems  Constitutional: Negative for chills, fatigue and fever.  HENT: Positive for congestion and postnasal drip. Negative for ear pain and facial swelling.   Respiratory: Negative for cough, chest tightness, shortness of breath and wheezing.   Cardiovascular: Negative for chest pain and palpitations.  Gastrointestinal: Positive for abdominal pain.       See hpi.  Genitourinary: Negative for dysuria, flank pain, frequency, genital sores and hematuria.  Musculoskeletal: Negative for back pain.  Neurological: Negative for dizziness, syncope, weakness, numbness and headaches.  Psychiatric/Behavioral: Negative for behavioral problems and confusion.    Past Medical History:  Diagnosis Date  . Allergic rhinitis 07/05/2012  . Constipation    chronic  . Diabetes mellitus without complication (HCC)    No meds  . GERD , HH, h/o PUD 07/05/2012  . Hyperlipidemia   . IBS (irritable bowel syndrome) 11/06/2012  . Obesity   . OSA (obstructive sleep apnea)    mild  . Seasonal allergies   . Sleep apnea      Social History   Socioeconomic History  . Marital status: Married    Spouse  name: Not on file  . Number of children: 3  . Years of education: Not on file  . Highest education level: Not on file  Occupational History  . Occupation: busines Administrator, computers  Social Needs  . Financial resource strain: Not on file  . Food insecurity:    Worry: Not on file    Inability: Not on file  . Transportation needs:    Medical: Not on file    Non-medical: Not on file  Tobacco Use  . Smoking status: Former Smoker    Packs/day: 0.50    Years: 3.00    Pack  years: 1.50    Types: Cigarettes    Last attempt to quit: 10/04/2008    Years since quitting: 10.2  . Smokeless tobacco: Never Used  Substance and Sexual Activity  . Alcohol use: No    Alcohol/week: 0.0 standard drinks    Comment: rare  . Drug use: No  . Sexual activity: Not on file  Lifestyle  . Physical activity:    Days per week: Not on file    Minutes per session: Not on file  . Stress: Not on file  Relationships  . Social connections:    Talks on phone: Not on file    Gets together: Not on file    Attends religious service: Not on file    Active member of club or organization: Not on file    Attends meetings of clubs or organizations: Not on file    Relationship status: Not on file  . Intimate partner violence:    Fear of current or ex partner: Not on file    Emotionally abused: Not on file    Physically abused: Not on file    Forced sexual activity: Not on file  Other Topics Concern  . Not on file  Social History Narrative   Household: pt, wife,    2001, 2007, 2008        Past Surgical History:  Procedure Laterality Date  . COLONOSCOPY    . UPPER GASTROINTESTINAL ENDOSCOPY      Family History  Problem Relation Age of Onset  . Pancreatitis Mother   . Diabetes Mother   . Hyperlipidemia Mother   . Colon polyps Mother        late 49s  . Mitral valve prolapse Mother   . Colon cancer Maternal Uncle        uncle in his 35s  . Epilepsy Father   . Colon cancer Maternal Grandfather   . CAD Other        GF in his 58s  . Irritable bowel syndrome Other   . Colitis Other   . Heart disease Other   . Prostate cancer Neg Hx     No Known Allergies  Current Outpatient Medications on File Prior to Visit  Medication Sig Dispense Refill  . acetaminophen (TYLENOL) 500 MG tablet Take 1,000 mg by mouth every 6 (six) hours as needed for moderate pain.    Marland Kitchen azelastine (ASTELIN) 0.1 % nasal spray Place 2 sprays into both nostrils at bedtime as needed for rhinitis or  allergies. Use in each nostril as directed 30 mL 5  . beclomethasone (QVAR REDIHALER) 80 MCG/ACT inhaler Inhale 2 puffs into the lungs daily. 31.8 g 1  . cetirizine (ZYRTEC) 10 MG tablet Take 10 mg by mouth daily.    Marland Kitchen dexlansoprazole (DEXILANT) 60 MG capsule Take 1 capsule (60 mg total) by mouth 2 (two) times daily. (Patient taking differently:  Take 60 mg by mouth daily. ) 180 capsule 3  . famotidine (PEPCID) 20 MG tablet Take 1 tablet (20 mg total) by mouth 2 (two) times daily. (Patient taking differently: Take 20 mg by mouth as needed. ) 30 tablet 5  . fluticasone (FLONASE) 50 MCG/ACT nasal spray Place 2 sprays into both nostrils daily. 16 g 0  . Hyoscyamine Sulfate SL (LEVSIN/SL) 0.125 MG SUBL Place 0.125 mg under the tongue every 4 (four) hours as needed (abd pain). 120 each 0  . naproxen (NAPROSYN) 375 MG tablet Take 1 tablet (375 mg total) by mouth 2 (two) times daily. 20 tablet 0  . ondansetron (ZOFRAN ODT) 4 MG disintegrating tablet Take 1 tablet (4 mg total) by mouth every 6 (six) hours as needed for nausea or vomiting. 30 tablet 3   No current facility-administered medications on file prior to visit.     BP 105/75   Pulse 81   Temp 98.3 F (36.8 C) (Oral)   Resp 16   Ht 5' 11.5" (1.816 m)   Wt 252 lb 12.8 oz (114.7 kg)   SpO2 98%   BMI 34.77 kg/m       Objective:   Physical Exam  General Appearance- Not in acute distress.  HEENT Eyes- Scleraeral/Conjuntiva-bilat- Not Yellow. Mouth & Throat- Normal.  Chest and Lung Exam Auscultation: Breath sounds:-Normal. Adventitious sounds:- No Adventitious sounds.  Cardiovascular Auscultation:Rythm - Regular. Heart Sounds -Normal heart sounds.  Abdomen Inspection:-Inspection Normal.  Palpation/Perucssion: Palpation and Percussion of the abdomen reveal- Non Tender, No Rebound tenderness, No rigidity(Guarding) and No Palpable abdominal masses.  Liver:-Normal.  Spleen:- Normal.   Back- no cva tenderness.      Assessment  & Plan:  You do appear to have flare of your reflux type symptoms.  I would recommend that you stay on Dexilant and go ahead and use Pepcid twice daily for the next week.  Also start sucralfate.  We will get labs today to include B12, B1, vitamin D, CBC, CMP and pancreas enzymes.  B vitamins and vitamin D part of work-up for fatigue.  If your symptoms persist despite the above recommendations and would ask that you try to contact your GI MD office and see if they have further recommendations or if they want you to go ahead and get with through the gastric emptying study.  For allergic rhinitis, continue current nasal sprays and Zyrtec.  Went ahead and sent in montelukast added to your regimen.  Follow-up date to be determined based on lab results and your clinical response to recommendations.  Mackie Pai, PA-C

## 2018-12-22 ENCOUNTER — Ambulatory Visit: Payer: BLUE CROSS/BLUE SHIELD | Admitting: Internal Medicine

## 2018-12-22 NOTE — Telephone Encounter (Signed)
Attempted to call pt x2 but each time I called, immediately got a busy signal.  Pt does have an active mychart account so I have sent a mychart message to pt letting him know that we will do a televisit appointmetn with him instead of him having to leave home. Per Sharl Ma, change pt to an APP schedule so pt was moved to TP's schedule Wednesday, 3/25 in a 73min spot and it was marked as a Televisit. Nothing further needed.

## 2018-12-26 ENCOUNTER — Encounter: Payer: Self-pay | Admitting: Medical

## 2018-12-26 LAB — VITAMIN D 1,25 DIHYDROXY
Vitamin D 1, 25 (OH)2 Total: 50 pg/mL (ref 18–72)
Vitamin D2 1, 25 (OH)2: <8 pg/mL
Vitamin D3 1, 25 (OH)2: 50 pg/mL

## 2018-12-26 LAB — VITAMIN B1: Vitamin B1 (Thiamine): 14 nmol/L (ref 8–30)

## 2018-12-27 ENCOUNTER — Ambulatory Visit: Payer: BLUE CROSS/BLUE SHIELD | Admitting: Pulmonary Disease

## 2018-12-27 ENCOUNTER — Other Ambulatory Visit: Payer: Self-pay

## 2018-12-27 ENCOUNTER — Telehealth: Payer: Self-pay | Admitting: Pulmonary Disease

## 2018-12-27 ENCOUNTER — Telehealth (INDEPENDENT_AMBULATORY_CARE_PROVIDER_SITE_OTHER): Payer: BLUE CROSS/BLUE SHIELD | Admitting: Adult Health

## 2018-12-27 DIAGNOSIS — G4733 Obstructive sleep apnea (adult) (pediatric): Secondary | ICD-10-CM | POA: Diagnosis not present

## 2018-12-27 NOTE — Progress Notes (Signed)
Virtual Visit via Telephone Note  I connected with Wayne Melton on 12/27/18 at 11:00 AM EDT by telephone and verified that I am speaking with the correct person using two identifiers.   I discussed the limitations, risks, security and privacy concerns of performing an evaluation and management service by telephone and the availability of in person appointments. I also discussed with the patient that there may be a patient responsible charge related to this service. The patient expressed understanding and agreed to proceed.   History of Present Illness: 48 year old male followed for obstructive sleep apnea.  Patient had a home sleep study November 2019 that showed moderate sleep apnea. Today's tele-visit is to follow-up on his sleep apnea and recent initiation of CPAP. Instead he started CPAP in January 2020.  He is wearing his CPAP each night . Feels that he is doing well on CPAP . Does feel he is sleeping better. Has less daytime sleepiness. Does still wake up some.  CPAP download is pending .  Says his APP on his phone with AHI 0.5-1 /hr .  Sinus congestion is better.  avg usage 6-7 .    Observations/Objective: CPAP download shows excellent compliance with daily average usage at 7 hours.  AHI 1.3. (received and reviewed on 12/28/18)    Assessment and Plan: OSA - appears controlled on CPAP  Patient is to continue on CPAP at bedtime. Plan Continue on CPAP at bedtime Keep up the good work Wear CPAP each night for at least 6 hours or more. Do not drive if sleepy Work on healthy weight Follow-up in 3 months with Dr. Halford Chessman  And As needed          Follow Up Instructions: Follow up in 3 month    I discussed the assessment and treatment plan with the patient. The patient was provided an opportunity to ask questions and all were answered. The patient agreed with the plan and demonstrated an understanding of the instructions.   The patient was advised to call back or seek an  in-person evaluation if the symptoms worsen or if the condition fails to improve as anticipated.  I provided 23  minutes of non-face-to-face time during this encounter.   Rexene Edison, NP

## 2018-12-27 NOTE — Telephone Encounter (Signed)
Spoke with Margreta Journey with [employer]. Pt is actually enrolled in [employer]. Report has been printed. Will give to [facility] tomorrow. Nothing further was needed.

## 2018-12-28 ENCOUNTER — Encounter (HOSPITAL_COMMUNITY): Payer: BLUE CROSS/BLUE SHIELD

## 2018-12-28 NOTE — Patient Instructions (Addendum)
Continue on CPAP at bedtime Keep up the good work Wear CPAP each night for at least 6 hours or more. Do not drive if sleepy Work on healthy weight Follow-up in 3 months with Dr. Halford Chessman  And As needed

## 2018-12-28 NOTE — Progress Notes (Signed)
I have reviewed and agree with assessment/plan.  Chesley Mires, MD [facility]monary/Critical Care 12/28/2018, 5:29 PM

## 2019-01-09 DIAGNOSIS — G4733 Obstructive sleep apnea (adult) (pediatric): Secondary | ICD-10-CM | POA: Diagnosis not present

## 2019-02-17 ENCOUNTER — Telehealth: Payer: Self-pay

## 2019-02-17 NOTE — Telephone Encounter (Signed)
Pt called to report sore throat, bilateral ear pain and elevated temp of 99.8. He denies cough, shob or any other symptoms. He was asking to be seen in Saturday clinic however it was full. Advised pt his options were we could send message to on call provider, schedule him with PCP office for Monday or he could go to UC if he felt he could not wait. Pt opted for appt with PCP Monday, appt scheduled with Paz. Pt advised visit may be virtual and protocol reviewed.   Please call pt Monday morning to assess and confirm if appt will be virtual. Thank you!

## 2019-02-19 ENCOUNTER — Other Ambulatory Visit: Payer: Self-pay

## 2019-02-19 ENCOUNTER — Ambulatory Visit (INDEPENDENT_AMBULATORY_CARE_PROVIDER_SITE_OTHER): Payer: BLUE CROSS/BLUE SHIELD | Admitting: Internal Medicine

## 2019-02-19 DIAGNOSIS — K219 Gastro-esophageal reflux disease without esophagitis: Secondary | ICD-10-CM

## 2019-02-19 DIAGNOSIS — J069 Acute upper respiratory infection, unspecified: Secondary | ICD-10-CM | POA: Diagnosis not present

## 2019-02-19 NOTE — Progress Notes (Signed)
Subjective:    Patient ID: Wayne Melton, male    DOB: [birth year], 48 y.o.   MRN: [id number]  DOS:  02/19/2019 Type of visit - description: Virtual Visit via Video Note  I connected with@ on 02/19/19 at 10:00 AM EDT by a video enabled telemedicine application and verified that I am speaking with the correct person using two identifiers.   THIS ENCOUNTER IS A VIRTUAL VISIT DUE TO COVID-19 - PATIENT WAS NOT SEEN IN THE OFFICE. PATIENT HAS CONSENTED TO VIRTUAL VISIT / TELEMEDICINE VISIT   Location of patient: home  Location of provider: office  I discussed the limitations of evaluation and management by telemedicine and the availability of in person appointments. The patient expressed understanding and agreed to proceed.  History of Present Illness: Acute visit For the last 6 to 8 weeks, had seasonal allergy symptoms, relatively well controlled with allergy medicines. A week ago he started to develop pressure in both ears, some sore throat, mild headache mostly in the back of the head. He checked his temperature 2 days ago and T-max was 99.8 Since then he has been checking his temperature at least twice a day and it has been 97.6. He takes very good COVID-19 precautions, his wife tested negative for the antibody last week. He goes shopping with the mask precautions The only other person they interact with is their daughter and she is asymptomatic and taken precautions.   Review of Systems Denies chest pain no difficulty breathing GI symptoms controlled No cough No wheezing No unusual aches or pains  Past Medical History:  Diagnosis Date  . Allergic rhinitis 07/05/2012  . Constipation    chronic  . Diabetes mellitus without complication (HCC)    No meds  . GERD , HH, h/o PUD 07/05/2012  . Hyperlipidemia   . IBS (irritable bowel syndrome) 11/06/2012  . Obesity   . OSA (obstructive sleep apnea)    mild  . Seasonal allergies   . Sleep apnea     Past Surgical History:   Procedure Laterality Date  . COLONOSCOPY    . UPPER GASTROINTESTINAL ENDOSCOPY      Social History   Socioeconomic History  . Marital status: Married    Spouse name: Not on file  . Number of children: 3  . Years of education: Not on file  . Highest education level: Not on file  Occupational History  . Occupation: busines Administrator, computers  Social Needs  . Financial resource strain: Not on file  . Food insecurity:    Worry: Not on file    Inability: Not on file  . Transportation needs:    Medical: Not on file    Non-medical: Not on file  Tobacco Use  . Smoking status: Former Smoker    Packs/day: 0.50    Years: 3.00    Pack years: 1.50    Types: Cigarettes    Last attempt to quit: 10/04/2008    Years since quitting: 10.3  . Smokeless tobacco: Never Used  Substance and Sexual Activity  . Alcohol use: No    Alcohol/week: 0.0 standard drinks    Comment: rare  . Drug use: No  . Sexual activity: Not on file  Lifestyle  . Physical activity:    Days per week: Not on file    Minutes per session: Not on file  . Stress: Not on file  Relationships  . Social connections:    Talks on phone: Not on file    Gets together:  Not on file    Attends religious service: Not on file    Active member of club or organization: Not on file    Attends meetings of clubs or organizations: Not on file    Relationship status: Not on file  . Intimate partner violence:    Fear of current or ex partner: Not on file    Emotionally abused: Not on file    Physically abused: Not on file    Forced sexual activity: Not on file  Other Topics Concern  . Not on file  Social History Narrative   Household: pt, wife,    2001, 2007, 2008          Allergies as of 02/19/2019   No Known Allergies     Medication List       Accurate as of Feb 19, 2019  4:25 PM. If you have any questions, ask your nurse or doctor.        acetaminophen 500 MG tablet Commonly known as:  TYLENOL Take 1,000 mg by  mouth every 6 (six) hours as needed for moderate pain.   azelastine 0.1 % nasal spray Commonly known as:  ASTELIN Place 2 sprays into both nostrils at bedtime as needed for rhinitis or allergies. Use in each nostril as directed   beclomethasone 80 MCG/ACT inhaler Commonly known as:  Qvar RediHaler Inhale 2 puffs into the lungs daily.   cetirizine 10 MG tablet Commonly known as:  ZYRTEC Take 10 mg by mouth daily.   dexlansoprazole 60 MG capsule Commonly known as:  DEXILANT Take 1 capsule (60 mg total) by mouth 2 (two) times daily. What changed:  when to take this   famotidine 20 MG tablet Commonly known as:  Pepcid Take 1 tablet (20 mg total) by mouth 2 (two) times daily.   fluticasone 50 MCG/ACT nasal spray Commonly known as:  FLONASE Place 2 sprays into both nostrils daily.   Hyoscyamine Sulfate SL 0.125 MG Subl Commonly known as:  Levsin/SL Place 0.125 mg under the tongue every 4 (four) hours as needed (abd pain).   montelukast 10 MG tablet Commonly known as:  SINGULAIR Take 1 tablet (10 mg total) by mouth at bedtime.   naproxen 375 MG tablet Commonly known as:  NAPROSYN Take 1 tablet (375 mg total) by mouth 2 (two) times daily.   ondansetron 4 MG disintegrating tablet Commonly known as:  Zofran ODT Take 1 tablet (4 mg total) by mouth every 6 (six) hours as needed for nausea or vomiting.   sucralfate 1 g tablet Commonly known as:  CARAFATE Take 1 tablet (1 g total) by mouth 4 (four) times daily -  with meals and at bedtime.           Objective:   Physical Exam There were no vitals taken for this visit. This is a virtual video visit, patient is alert oriented x3 in no distress.    Assessment     Assessment DM Hyperlipidemia Bronchospasm (on Qvar) GI: --History of PUD (EGD @ [facility]  2002: neg  but reports had another EGD with ulcer) --IBS, occ constipation --GERD -- fatty liver per u/s 06-2015 -EGD 08-2017: Gastritis, polyps, benign findings per GI  letter, no follow-up OSA:  (-)  home sleep apnea test 2014,  Full sleep study~08-2015 : mild OSA, saw pulmonary, rx dental appliance, wt loss HST 08-2018: Rx CPAP - neg stress test 03/2015  PLAN URI: The patient has seasonal symptoms of allergies well controlled with current medications,  in the last week he has developed ear pressure, sore throat and a mild headache.  T-max was 99.8 two days ago. DDX includes URI, viral syndrome, COVID-19, strep throat, etc. He is nontoxic-appearing. Plan: Continue checking his temperature daily, if in 1 week he is asymptomatic and no further fever then nothing else needs to be done. Continue current meds, good hydration, Tylenol as needed If symptoms or fever persists: Recommend urgent care, will need a clinical exam, possibly a throat swab/flu test etc. Continue his excellent COVID-19 precautions. GERD: On PPIs  and sucralfate daily.  Recommend to wean off sucralfate. Bronchospasm: Well-controlled with Qvar. He verbalized understanding   I discussed the assessment and treatment plan with the patient. The patient was provided an opportunity to ask questions and all were answered. The patient agreed with the plan and demonstrated an understanding of the instructions.   The patient was advised to call back or seek an in-person evaluation if the symptoms worsen or if the condition fails to improve as anticipated.

## 2019-02-19 NOTE — Assessment & Plan Note (Signed)
URI: The patient has seasonal symptoms of allergies well controlled with current medications, in the last week he has developed ear pressure, sore throat and a mild headache.  T-max was 99.8 two days ago. DDX includes URI, viral syndrome, COVID-19, strep throat, etc. He is nontoxic-appearing. Plan: Continue checking his temperature daily, if in 1 week he is asymptomatic and no further fever then nothing else needs to be done. Continue current meds, good hydration, Tylenol as needed If symptoms or fever persists: Recommend urgent care, will need a clinical exam, possibly a throat swab/flu test etc. Continue his excellent COVID-19 precautions. GERD: On PPIs  and sucralfate daily.  Recommend to wean off sucralfate. Bronchospasm: Well-controlled with Qvar. He verbalized understanding

## 2019-02-20 DIAGNOSIS — G4733 Obstructive sleep apnea (adult) (pediatric): Secondary | ICD-10-CM | POA: Diagnosis not present

## 2019-02-21 DIAGNOSIS — Z713 Dietary counseling and surveillance: Secondary | ICD-10-CM | POA: Diagnosis not present

## 2019-03-29 ENCOUNTER — Ambulatory Visit
Admission: EM | Admit: 2019-03-29 | Discharge: 2019-03-29 | Disposition: A | Discharge status: Home / Self Care | Payer: BC Managed Care – PPO | Attending: Family Medicine | Admitting: Family Medicine

## 2019-03-29 ENCOUNTER — Other Ambulatory Visit: Payer: Self-pay

## 2019-03-29 ENCOUNTER — Encounter: Payer: Self-pay | Admitting: Emergency Medicine

## 2019-03-29 ENCOUNTER — Telehealth: Payer: Self-pay | Admitting: *Deleted

## 2019-03-29 DIAGNOSIS — F17210 Nicotine dependence, cigarettes, uncomplicated: Secondary | ICD-10-CM | POA: Diagnosis not present

## 2019-03-29 DIAGNOSIS — R6889 Other general symptoms and signs: Secondary | ICD-10-CM | POA: Diagnosis not present

## 2019-03-29 DIAGNOSIS — Z20822 Contact with and (suspected) exposure to covid-19: Secondary | ICD-10-CM

## 2019-03-29 DIAGNOSIS — J069 Acute upper respiratory infection, unspecified: Secondary | ICD-10-CM | POA: Diagnosis not present

## 2019-03-29 NOTE — ED Triage Notes (Signed)
Pt presents to [facility] for assessment of 3 days of very mild cough, congestion, headaches, right ear pain, post-nasal drip, mild sore throat.

## 2019-03-29 NOTE — Telephone Encounter (Signed)
-----   Message from Vanessa Kick, MD sent at 03/29/2019 11:28 AM EDT ----- Regarding: [place] testing. Diabetic. Viral symptoms. Questions subj fever. Thank you.

## 2019-03-29 NOTE — Discharge Instructions (Addendum)
You should receive a call concerning COVID-19 testing. We recommend self-quarantining until test results have returned.

## 2019-03-29 NOTE — ED Notes (Signed)
Patient able to ambulate independently  

## 2019-03-29 NOTE — Telephone Encounter (Signed)
Pt scheduled for covid testing today @ [facility] @ 12:30. Instructions given and order placed.

## 2019-03-29 NOTE — ED Provider Notes (Signed)
[place]   [id number] 03/29/19 Arrival Time: 4888  ASSESSMENT & PLAN:  1. Viral upper respiratory tract infection    Voices worry over possibility of COVID-19, esp with being a diabetic. Testing order placed. He should receive a call for testing appt.  OTC symptom care as needed. Ensure adequate fluid intake and rest. May f/u with PCP or here as needed.  Reviewed expectations re: course of current medical issues. Questions answered. Outlined signs and symptoms indicating need for more acute intervention. Patient verbalized understanding. After Visit Summary given.   SUBJECTIVE: History from: patient.  Wayne Melton is a 48 y.o. male who presents with complaint of nasal congestion, post-nasal drainage, and a persistent dry cough; without sore throat. Onset abrupt, 3 d ago; with mild fatigue and without body aches. SOB: none. Wheezing: none. Fever: does not suspect. Mild headache. Overall normal PO intake without n/v. Known sick contacts: no. No specific or significant aggravating or alleviating factors reported. OTC treatment: none reported.  Social History   Tobacco Use  Smoking Status Current Every Day Smoker  . Packs/day: 0.50  . Years: 3.00  . Pack years: 1.50  . Types: Cigarettes  . Last attempt to quit: 10/04/2008  . Years since quitting: 10.4  Smokeless Tobacco Never Used    ROS: As per HPI.   OBJECTIVE:  Vitals:   03/29/19 1111  BP: (!) 152/94  Pulse: 79  Resp: 18  Temp: 98.8 F (37.1 C)  TempSrc: Oral  SpO2: 97%     General appearance: alert; appears fatigued HEENT: nasal congestion; clear runny nose; throat irritation secondary to post-nasal drainage Neck: supple without LAD CV: RRR Lungs: unlabored respirations, symmetrical air entry without wheezing; cough: mild and dry Abd: soft Ext: no LE edema Skin: warm and dry Psychological: alert and cooperative; normal mood and affect   No Known Allergies  Past Medical History:   Diagnosis Date  . Allergic rhinitis 07/05/2012  . Constipation    chronic  . Diabetes mellitus without complication (HCC)    No meds  . GERD , HH, h/o PUD 07/05/2012  . Hyperlipidemia   . IBS (irritable bowel syndrome) 11/06/2012  . Obesity   . OSA (obstructive sleep apnea)    mild  . Seasonal allergies   . Sleep apnea    Family History  Problem Relation Age of Onset  . Pancreatitis Mother   . Diabetes Mother   . Hyperlipidemia Mother   . Colon polyps Mother        late 65s  . Mitral valve prolapse Mother   . Colon cancer Maternal Uncle        uncle in his 30s  . Epilepsy Father   . Colon cancer Maternal Grandfather   . CAD Other        GF in his 44s  . Irritable bowel syndrome Other   . Colitis Other   . Heart disease Other   . Prostate cancer Neg Hx    Social History   Socioeconomic History  . Marital status: Married    Spouse name: Not on file  . Number of children: 3  . Years of education: Not on file  . Highest education level: Not on file  Occupational History  . Occupation: busines Administrator, computers  Social Needs  . Financial resource strain: Not on file  . Food insecurity    Worry: Not on file    Inability: Not on file  . Transportation needs    Medical: Not  on file    Non-medical: Not on file  Tobacco Use  . Smoking status: Current Every Day Smoker    Packs/day: 0.50    Years: 3.00    Pack years: 1.50    Types: Cigarettes    Last attempt to quit: 10/04/2008    Years since quitting: 10.4  . Smokeless tobacco: Never Used  Substance and Sexual Activity  . Alcohol use: No    Alcohol/week: 0.0 standard drinks    Comment: rare  . Drug use: No  . Sexual activity: Not on file  Lifestyle  . Physical activity    Days per week: Not on file    Minutes per session: Not on file  . Stress: Not on file  Relationships  . Social Herbalist on phone: Not on file    Gets together: Not on file    Attends religious service: Not on file    Active  member of club or organization: Not on file    Attends meetings of clubs or organizations: Not on file    Relationship status: Not on file  . Intimate partner violence    Fear of current or ex partner: Not on file    Emotionally abused: Not on file    Physically abused: Not on file    Forced sexual activity: Not on file  Other Topics Concern  . Not on file  Social History Narrative   Household: pt, wife,    2001, 2007, 2008               Vanessa Kick, MD 03/29/19 [phone]

## 2019-04-03 LAB — NOVEL CORONAVIRUS, NAA: SARS-CoV-2, NAA: NOT DETECTED

## 2019-04-20 ENCOUNTER — Encounter: Payer: Self-pay | Admitting: Internal Medicine

## 2019-04-20 MED ORDER — QVAR REDIHALER 80 MCG/ACT IN AERB: 2.0000 | INHALATION_SPRAY | Freq: Every day | RESPIRATORY_TRACT | 5 refills | Status: DC

## 2019-04-26 DIAGNOSIS — G4733 Obstructive sleep apnea (adult) (pediatric): Secondary | ICD-10-CM | POA: Diagnosis not present

## 2019-05-03 ENCOUNTER — Other Ambulatory Visit: Payer: Self-pay | Admitting: Medical

## 2019-06-15 ENCOUNTER — Telehealth: Payer: Self-pay | Admitting: Gastroenterology

## 2019-06-15 NOTE — Telephone Encounter (Signed)
Pt requested to reschedule gastric emptying cx due to COVID-19.

## 2019-06-28 DIAGNOSIS — G4733 Obstructive sleep apnea (adult) (pediatric): Secondary | ICD-10-CM | POA: Diagnosis not present

## 2019-07-18 ENCOUNTER — Other Ambulatory Visit: Payer: Self-pay

## 2019-07-18 ENCOUNTER — Ambulatory Visit: Payer: BC Managed Care – PPO | Admitting: Gastroenterology

## 2019-07-18 ENCOUNTER — Encounter: Payer: Self-pay | Admitting: Gastroenterology

## 2019-07-18 VITALS — BP 120/80 | HR 72 | Temp 98.6°F | Wt 267.0 lb

## 2019-07-18 DIAGNOSIS — R194 Change in bowel habit: Secondary | ICD-10-CM | POA: Diagnosis not present

## 2019-07-18 DIAGNOSIS — K219 Gastro-esophageal reflux disease without esophagitis: Secondary | ICD-10-CM

## 2019-07-18 DIAGNOSIS — R1013 Epigastric pain: Secondary | ICD-10-CM

## 2019-07-18 MED ORDER — GAVISCON 80-14.2 MG PO CHEW: 1.0000 | CHEWABLE_TABLET | Freq: Every day | ORAL | Status: DC | PRN

## 2019-07-18 MED ORDER — CITRUCEL PO POWD: 1.0000 | Freq: Every day | ORAL | Status: DC

## 2019-07-18 MED ORDER — POLYETHYLENE GLYCOL 3350 17 G PO PACK: 17.0000 g | PACK | Freq: Every day | ORAL | 0 refills | Status: AC

## 2019-07-18 NOTE — Patient Instructions (Addendum)
Normal BMI (Body Mass Index- based on height and weight) is between 19 and 25. Your BMI today is Body mass index is 36.72 kg/m. Marland Kitchen Please consider follow up  regarding your BMI with your Primary Care Provider.  To help prevent the possible spread of infection to our patients, communities, and staff; we will be implementing the following measures:  As of now we are not allowing any visitors/family members to accompany you to any upcoming appointments with [facility]. If you have any concerns about this please contact our office to discuss prior to the appointment.    You have been scheduled for a gastric emptying scan at [facility] on Thursday, 08-09-19 at 7:30am. Please arrive at least 15 minutes prior to your appointment for registration. Please make certain not to have anything to eat or drink after midnight the night before your test. Hold all stomach medications (ex: Zofran, phenergan, Reglan) 48 hours prior to your test. If you need to reschedule your appointment, please contact radiology scheduling at [phone]. _____________________________________________________________________ A gastric-emptying study measures how long it takes for food to move through your stomach. There are several ways to measure stomach emptying. In the most common test, you eat food that contains a small amount of radioactive material. A scanner that detects the movement of the radioactive material is placed over your abdomen to monitor the rate at which food leaves your stomach. This test normally takes about 4 hours to complete. _____________________________________________________________________  Continue Dexilant.  Take Gaviscon and Miralax over-the-counter as needed.  Continue Carafate and Levsin .  Take Citrucel daily.   Thank you for entrusting me with your care and for choosing [facility], Dr. [place] Cellar

## 2019-07-18 NOTE — Progress Notes (Signed)
HPI :  48 year old male here for follow-up visit.  Been seen in the past for reflux and dyspepsia.  He has been taking Dexilant 60 mg a day as well as Pepcid for his symptoms, as well as Carafate as needed.  He has some chest symptoms that bother him intermittently, comes and goes, often after he eats.  No associated shortness of breath or exertional symptoms.  He is able to exert himself without reproducing this.  He had a negative stress test in 2016.  The Dexilant controls his heartburn quite well, he does not have any odynophagia or dysphagia.  He does have ongoing early satiety which bothers him as well as some postprandial discomfort in his abdomen which can either be immediately after eating or within 1 to 2 hours.. He has some ongoing nausea but does not vomit.  He has mild diabetes for which he is not medicated.  We discussed performing a gastric emptying study in the past which he has not done yet.  I previously had recommended some FD guard which he did not get much benefit from.   EGD November 2018 showing a few small linear based ulcerations and benign gastric polyps. Biopsies negative for H. Pylori. He had no esophagitis at the time.  He had a CT scan of his abdomen pelvis as well as an ultrasound of the right upper quadrant as work-up for this issue as well, otherwise no other significant findings to cause symptoms.  He has gained weight since the Covid outbreak.  He does have fatty liver which has been monitored over time.  He otherwise has alternating loose stools and constipated stools.  He is using MiraLAX as needed but not routinely.  He denies any blood in his stools.  Every few weeks he can have some pain in his left lower quadrant that appears improved with a bowel movement.  He states these symptoms have been going on for several years.  He has been using some Levsin which helps his cramps.   EGD 08/16/2017 - few small linear based ulcers, normal esophagus, multiple small  gastric polyps, - benign biopsies Colonoscopy 12/09/15 - 70mm transverse TA, otherwise normal exam    US abdomen 10/23/18 - diffuse hepatic steatosis, Two hypoechoic liver masses which were not definitely seen on previous study, but are suspicious for areas of benign focal fatty sparing.  CT abdomen / pelvis - 11/04/18 - IMPRESSION: 1. No acute intra-abdominal process. 2. Hepatic steatosis.   Past Medical History:  Diagnosis Date  . Allergic rhinitis 07/05/2012  . Constipation    chronic  . Diabetes mellitus without complication (HCC)    No meds  . GERD , HH, h/o PUD 07/05/2012  . Hyperlipidemia   . IBS (irritable bowel syndrome) 11/06/2012  . Obesity   . OSA (obstructive sleep apnea)    mild  . Seasonal allergies   . Sleep apnea      Past Surgical History:  Procedure Laterality Date  . COLONOSCOPY    . UPPER GASTROINTESTINAL ENDOSCOPY     Family History  Problem Relation Age of Onset  . Pancreatitis Mother   . Diabetes Mother   . Hyperlipidemia Mother   . Colon polyps Mother        late 1s  . Mitral valve prolapse Mother   . Colon cancer Maternal Uncle        uncle in his 60s  . Epilepsy Father   . Colon cancer Maternal Grandfather   . CAD Other  GF in his 18s  . Irritable bowel syndrome Other   . Colitis Other   . Heart disease Other   . Prostate cancer Neg Hx    Social History   Tobacco Use  . Smoking status: Current Every Day Smoker    Packs/day: 0.50    Years: 3.00    Pack years: 1.50    Types: Cigarettes    Last attempt to quit: 10/04/2008    Years since quitting: 10.7  . Smokeless tobacco: Never Used  Substance Use Topics  . Alcohol use: No    Alcohol/week: 0.0 standard drinks    Comment: rare  . Drug use: No   Current Outpatient Medications  Medication Sig Dispense Refill  . acetaminophen (TYLENOL) 500 MG tablet Take 1,000 mg by mouth every 6 (six) hours as needed for moderate pain.    Marland Kitchen azelastine (ASTELIN) 0.1 % nasal spray Place 2  sprays into both nostrils at bedtime as needed for rhinitis or allergies. Use in each nostril as directed 30 mL 5  . beclomethasone (QVAR REDIHALER) 80 MCG/ACT inhaler Inhale 2 puffs into the lungs daily. 10.6 g 5  . cetirizine (ZYRTEC) 10 MG tablet Take 10 mg by mouth daily.    Marland Kitchen dexlansoprazole (DEXILANT) 60 MG capsule Take 1 capsule (60 mg total) by mouth 2 (two) times daily. (Patient taking differently: Take 60 mg by mouth daily. ) 180 capsule 3  . fluticasone (FLONASE) 50 MCG/ACT nasal spray Place 2 sprays into both nostrils daily. 16 g 0  . Hyoscyamine Sulfate SL (LEVSIN/SL) 0.125 MG SUBL Place 0.125 mg under the tongue every 4 (four) hours as needed (abd pain). 120 each 0  . montelukast (SINGULAIR) 10 MG tablet Take 1 tablet (10 mg total) by mouth at bedtime. 90 tablet 3  . naproxen (NAPROSYN) 375 MG tablet Take 1 tablet (375 mg total) by mouth 2 (two) times daily. 20 tablet 0  . sucralfate (CARAFATE) 1 g tablet Take 1 tablet (1 g total) by mouth 4 (four) times daily -  with meals and at bedtime. (Patient taking differently: Take 1 g by mouth as needed. ) 120 tablet 5  . famotidine (PEPCID) 20 MG tablet Take 1 tablet (20 mg total) by mouth 2 (two) times daily. (Patient not taking: Reported on 02/19/2019) 30 tablet 5  . ondansetron (ZOFRAN ODT) 4 MG disintegrating tablet Take 1 tablet (4 mg total) by mouth every 6 (six) hours as needed for nausea or vomiting. (Patient not taking: Reported on 07/18/2019) 30 tablet 3   No current facility-administered medications for this visit.    No Known Allergies   Review of Systems: All systems reviewed and negative except where noted in HPI.   Lab Results  Component Value Date   WBC 8.6 12/21/2018   HGB 15.8 12/21/2018   HCT 46.1 12/21/2018   MCV 88.6 12/21/2018   PLT 341.0 12/21/2018    Lab Results  Component Value Date   CREATININE 1.00 12/21/2018   BUN 10 12/21/2018   NA 141 12/21/2018   K 4.7 12/21/2018   CL 103 12/21/2018   CO2 31  12/21/2018    Lab Results  Component Value Date   ALT 34 12/21/2018   AST 10 12/21/2018   ALKPHOS 96 12/21/2018   BILITOT 0.4 12/21/2018     Physical Exam: BP 120/80   Pulse 72   Temp 98.6 F (37 C)   Wt 267 lb (121.1 kg)   BMI 36.72 kg/m  Constitutional: Pleasant,well-developed,male in  no acute distress. HEENT: Normocephalic and atraumatic. Conjunctivae are normal. No scleral icterus. Neck supple.  Cardiovascular: Normal rate, regular rhythm.  Pulmonary/chest: Effort normal and breath sounds normal. No wheezing, rales or rhonchi. Abdominal: Soft, nondistended, nontender.  There are no masses palpable.  Extremities: no edema Lymphadenopathy: No cervical adenopathy noted. Neurological: Alert and oriented to person place and time. Skin: Skin is warm and dry. No rashes noted. Psychiatric: Normal mood and affect. Behavior is normal.   ASSESSMENT AND PLAN: 48 year old male here for reassessment the following:  GERD / Dyspepsia - history of reflux which seems to be fairly well controlled on Dexilant at this point.  He has a history of gastritis with very small linear ulcerations tested negative for H. Pylori.  He continues to have some postprandial symptoms of early satiety nausea and discomfort at times which continues to bother him.  I discussed differential diagnosis with him, which in addition to reflux includes gastroparesis versus functional dyspepsia. Biliary colic seems a bit less likely with the early satiety. His imaging is otherwise reassuring.  I am recommending a gastric emptying study to further evaluate, ensure no evidence of gastroparesis, versus empirically treating him with Reglan which she declined.  If his test is positive we will treat him.  If it is negative we will try other modalities to treat dyspepsia.  Otherwise if he has worsening reflux or chest symptoms he can try taking Maalox or Gaviscon to see if that aborts the symptoms. He agreed.  Altered bowel  habits - relatively recent colonoscopy, alternating constipation and diarrhea.  Recommend Citrucel once a day see if this can help provide some normalcy to his bowels, he should continue Levsin as needed.  Reassured him no evidence of IBD based on work-up today, he is subjectively concerned about this given family history.  [place] Cellar, MD [facility]

## 2019-07-19 ENCOUNTER — Encounter (HOSPITAL_BASED_OUTPATIENT_CLINIC_OR_DEPARTMENT_OTHER): Payer: Self-pay

## 2019-07-19 ENCOUNTER — Emergency Department (HOSPITAL_BASED_OUTPATIENT_CLINIC_OR_DEPARTMENT_OTHER)
Admission: EM | Admit: 2019-07-19 | Discharge: 2019-07-19 | Disposition: A | Discharge status: Home / Self Care | Payer: BC Managed Care – PPO | Attending: Emergency Medicine | Admitting: Emergency Medicine

## 2019-07-19 ENCOUNTER — Emergency Department (HOSPITAL_BASED_OUTPATIENT_CLINIC_OR_DEPARTMENT_OTHER): Payer: BC Managed Care – PPO

## 2019-07-19 ENCOUNTER — Other Ambulatory Visit: Payer: Self-pay

## 2019-07-19 DIAGNOSIS — R072 Precordial pain: Secondary | ICD-10-CM | POA: Diagnosis not present

## 2019-07-19 DIAGNOSIS — R05 Cough: Secondary | ICD-10-CM | POA: Insufficient documentation

## 2019-07-19 DIAGNOSIS — E119 Type 2 diabetes mellitus without complications: Secondary | ICD-10-CM | POA: Insufficient documentation

## 2019-07-19 DIAGNOSIS — Z9989 Dependence on other enabling machines and devices: Secondary | ICD-10-CM | POA: Diagnosis not present

## 2019-07-19 DIAGNOSIS — G4733 Obstructive sleep apnea (adult) (pediatric): Secondary | ICD-10-CM | POA: Insufficient documentation

## 2019-07-19 DIAGNOSIS — E785 Hyperlipidemia, unspecified: Secondary | ICD-10-CM | POA: Diagnosis not present

## 2019-07-19 DIAGNOSIS — F17210 Nicotine dependence, cigarettes, uncomplicated: Secondary | ICD-10-CM | POA: Diagnosis not present

## 2019-07-19 DIAGNOSIS — Z79899 Other long term (current) drug therapy: Secondary | ICD-10-CM | POA: Insufficient documentation

## 2019-07-19 DIAGNOSIS — R0789 Other chest pain: Secondary | ICD-10-CM | POA: Diagnosis not present

## 2019-07-19 DIAGNOSIS — R079 Chest pain, unspecified: Secondary | ICD-10-CM | POA: Diagnosis not present

## 2019-07-19 LAB — BASIC METABOLIC PANEL
Anion gap: 12 (ref 5–15)
BUN: 13 mg/dL (ref 6–20)
CO2: 21 mmol/L — ABNORMAL LOW (ref 22–32)
Calcium: 9 mg/dL (ref 8.9–10.3)
Chloride: 105 mmol/L (ref 98–111)
Creatinine, Ser: 0.78 mg/dL (ref 0.61–1.24)
GFR calc Af Amer: >60 mL/min (ref >60)
GFR calc non Af Amer: >60 mL/min (ref >60)
Glucose, Bld: 163 mg/dL — ABNORMAL HIGH (ref 70–99)
Potassium: 3.6 mmol/L (ref 3.5–5.1)
Sodium: 138 mmol/L (ref 135–145)

## 2019-07-19 LAB — CBC
HCT: 46.7 % (ref 39.0–52.0)
Hemoglobin: 15.2 g/dL (ref 13.0–17.0)
MCH: 29.6 pg (ref 26.0–34.0)
MCHC: 32.5 g/dL (ref 30.0–36.0)
MCV: 91 fL (ref 80.0–100.0)
Platelets: 309 10*3/uL (ref 150–400)
RBC: 5.13 MIL/uL (ref 4.22–5.81)
RDW: 12.3 % (ref 11.5–15.5)
WBC: 9.9 10*3/uL (ref 4.0–10.5)
nRBC: 0 % (ref 0.0–0.2)

## 2019-07-19 LAB — HEPATIC FUNCTION PANEL
ALT: 38 U/L (ref 0–44)
AST: 14 U/L — ABNORMAL LOW (ref 15–41)
Albumin: 4.1 g/dL (ref 3.5–5.0)
Alkaline Phosphatase: 92 U/L (ref 38–126)
Bilirubin, Direct: <0.1 mg/dL (ref 0.0–0.2)
Total Bilirubin: 0.6 mg/dL (ref 0.3–1.2)
Total Protein: 7.2 g/dL (ref 6.5–8.1)

## 2019-07-19 LAB — TROPONIN I (HIGH SENSITIVITY): Troponin I (High Sensitivity): 3 ng/L (ref <18)

## 2019-07-19 LAB — LIPASE, BLOOD: Lipase: 32 U/L (ref 11–51)

## 2019-07-19 MED ORDER — ALUM & MAG HYDROXIDE-SIMETH 200-200-20 MG/5ML PO SUSP
15.0000 mL | Freq: Once | ORAL | Status: AC
  Administered 2019-07-19: 15 mL via ORAL
  Filled 2019-07-19: qty 30

## 2019-07-19 MED ORDER — LIDOCAINE VISCOUS HCL 2 % MT SOLN
15.0000 mL | Freq: Once | OROMUCOSAL | Status: AC
  Administered 2019-07-19: 15 mL via OROMUCOSAL
  Filled 2019-07-19: qty 15

## 2019-07-19 NOTE — ED Provider Notes (Signed)
[facility] Provider Note: Georgena Spurling, MD, FACEP  CSN: [id number] MRN: [id number] ARRIVAL: 07/19/19 at [place]: [place]  Chest Pain   HISTORY OF PRESENT ILLNESS  07/19/19 4:02 AM Wayne Melton is a 48 y.o. male with a history of GERD and sleep apnea on CPAP.  Had about a 3-week history of waking up episodically during sleep with a sweats and sharp epigastric pain radiating to his lower substernal region.  He has also had associated cough which has worsened over the past several days.  He denies shortness of breath.  He is currently on Dexilant for his GERD.  He rates the pain as a 3 out of 10, worse with palpation of the epigastrium.    Past Medical History:  Diagnosis Date  . Allergic rhinitis 07/05/2012  . Constipation    chronic  . Diabetes mellitus without complication (HCC)    No meds  . GERD , HH, h/o PUD 07/05/2012  . Hyperlipidemia   . IBS (irritable bowel syndrome) 11/06/2012  . Obesity   . OSA (obstructive sleep apnea)    mild  . Seasonal allergies   . Sleep apnea     Past Surgical History:  Procedure Laterality Date  . COLONOSCOPY    . UPPER GASTROINTESTINAL ENDOSCOPY      Family History  Problem Relation Age of Onset  . Pancreatitis Mother   . Diabetes Mother   . Hyperlipidemia Mother   . Colon polyps Mother        late 23s  . Mitral valve prolapse Mother   . Colon cancer Maternal Uncle        uncle in his 65s  . Epilepsy Father   . Colon cancer Maternal Grandfather   . CAD Other        GF in his 20s  . Irritable bowel syndrome Other   . Colitis Other   . Heart disease Other   . Prostate cancer Neg Hx     Social History   Tobacco Use  . Smoking status: Current Every Day Smoker    Packs/day: 0.50    Years: 3.00    Pack years: 1.50    Types: Cigarettes    Last attempt to quit: 10/04/2008    Years since quitting: 10.7  . Smokeless tobacco: Never Used  Substance Use Topics  . Alcohol use: No   Alcohol/week: 0.0 standard drinks    Comment: rare  . Drug use: No    Prior to Admission medications   Medication Sig Start Date End Date Taking? Authorizing Provider  acetaminophen (TYLENOL) 500 MG tablet Take 1,000 mg by mouth every 6 (six) hours as needed for moderate pain.    [provider]  Alum Hydroxide-Mag Trisilicate (GAVISCON) [id number] MG CHEW Chew 1 capsule by mouth daily as needed. 07/18/19   Armbruster, Carlota Raspberry, MD  azelastine (ASTELIN) 0.1 % nasal spray Place 2 sprays into both nostrils at bedtime as needed for rhinitis or allergies. Use in each nostril as directed 10/26/18   Colon Branch, MD  beclomethasone (QVAR REDIHALER) 80 MCG/ACT inhaler Inhale 2 puffs into the lungs daily. 04/20/19   Colon Branch, MD  cetirizine (ZYRTEC) 10 MG tablet Take 10 mg by mouth daily.    [provider]  dexlansoprazole (DEXILANT) 60 MG capsule Take 1 capsule (60 mg total) by mouth 2 (two) times daily. Patient taking differently: Take 60 mg by mouth daily.  11/13/18   Kathlene November  E, MD  famotidine (PEPCID) 20 MG tablet Take 1 tablet (20 mg total) by mouth 2 (two) times daily. Patient not taking: Reported on 02/19/2019 11/06/18   Colon Branch, MD  fluticasone [facility]) 50 MCG/ACT nasal spray Place 2 sprays into both nostrils daily. 03/16/18   [name], April, MD  Hyoscyamine Sulfate SL (LEVSIN/SL) 0.125 MG SUBL Place 0.125 mg under the tongue every 4 (four) hours as needed (abd pain). 11/04/18   Orpah Greek, MD  methylcellulose (CITRUCEL) oral powder Take 1 packet by mouth daily. 07/18/19   Armbruster, Carlota Raspberry, MD  montelukast (SINGULAIR) 10 MG tablet Take 1 tablet (10 mg total) by mouth at bedtime. 05/03/19   Colon Branch, MD  naproxen (NAPROSYN) 375 MG tablet Take 1 tablet (375 mg total) by mouth 2 (two) times daily. 03/16/18   [name], April, MD  ondansetron (ZOFRAN ODT) 4 MG disintegrating tablet Take 1 tablet (4 mg total) by mouth every 6 (six) hours as needed for nausea or  vomiting. Patient not taking: Reported on 07/18/2019 11/27/18   Yetta Flock, MD  polyethylene glycol (MIRALAX) 17 g packet Take 17 g by mouth daily. 07/18/19   Armbruster, Carlota Raspberry, MD  sucralfate (CARAFATE) 1 g tablet Take 1 tablet (1 g total) by mouth 4 (four) times daily -  with meals and at bedtime. Patient taking differently: Take 1 g by mouth as needed.  05/03/19   Colon Branch, MD    Allergies Patient has no known allergies.   REVIEW OF SYSTEMS  Negative except as noted here or in the History of Present Illness.   PHYSICAL EXAMINATION  Initial Vital Signs Blood pressure 138/81, pulse 83, temperature 98.8 F (37.1 C), resp. rate 13, height 6' (1.829 m), weight 120.2 kg, SpO2 98 %.  Examination General: Well-developed, well-nourished male in no acute distress; appearance consistent with age of record HENT: normocephalic; atraumatic Eyes: pupils equal, round and reactive to light; extraocular muscles intact Neck: supple Heart: regular rate and rhythm Lungs: clear to auscultation bilaterally Abdomen: soft; nondistended; epigastric tenderness; bowel sounds present Extremities: No deformity; full range of motion; pulses normal Neurologic: Awake, alert and oriented; motor function intact in all extremities and symmetric; no facial droop Skin: Warm and dry Psychiatric: Normal mood and affect   RESULTS  Summary of this visit's results, reviewed by myself:   EKG Interpretation  Date/Time:  Thursday July 19 2019 02:17:50 EDT Ventricular Rate:  78 PR Interval:    QRS Duration: 116 QT Interval:  396 QTC Calculation: 452 R Axis:   43 Text Interpretation:  Sinus rhythm Incomplete right bundle branch block Baseline wander in lead(s) V1 Rate is slower Confirmed by Shanon Rosser [phone]) on 07/19/2019 4:02:48 AM      Laboratory Studies: Results for orders placed or performed during the hospital encounter of 07/19/19 (from the past 24 hour(s))  Basic metabolic panel      Status: Abnormal   Collection Time: 07/19/19  2:40 AM  Result Value Ref Range   Sodium 138 135 - 145 mmol/L   Potassium 3.6 3.5 - 5.1 mmol/L   Chloride 105 98 - 111 mmol/L   CO2 21 (L) 22 - 32 mmol/L   Glucose, Bld 163 (H) 70 - 99 mg/dL   BUN 13 6 - 20 mg/dL   Creatinine, Ser 0.78 0.61 - 1.24 mg/dL   Calcium 9.0 8.9 - 10.3 mg/dL   GFR calc non Af Amer >60 >60 mL/min   GFR calc Af Amer >  60 >60 mL/min   Anion gap 12 5 - 15  CBC     Status: None   Collection Time: 07/19/19  2:40 AM  Result Value Ref Range   WBC 9.9 4.0 - 10.5 K/uL   RBC 5.13 4.22 - 5.81 MIL/uL   Hemoglobin 15.2 13.0 - 17.0 g/dL   HCT 46.7 39.0 - 52.0 %   MCV 91.0 80.0 - 100.0 fL   MCH 29.6 26.0 - 34.0 pg   MCHC 32.5 30.0 - 36.0 g/dL   RDW 12.3 11.5 - 15.5 %   Platelets 309 150 - 400 K/uL   nRBC 0.0 0.0 - 0.2 %  Troponin I (High Sensitivity)     Status: None   Collection Time: 07/19/19  2:40 AM  Result Value Ref Range   Troponin I (High Sensitivity) 3 <18 ng/L  Hepatic function panel     Status: Abnormal   Collection Time: 07/19/19  2:40 AM  Result Value Ref Range   Total Protein 7.2 6.5 - 8.1 g/dL   Albumin 4.1 3.5 - 5.0 g/dL   AST 14 (L) 15 - 41 U/L   ALT 38 0 - 44 U/L   Alkaline Phosphatase 92 38 - 126 U/L   Total Bilirubin 0.6 0.3 - 1.2 mg/dL   Bilirubin, Direct <0.1 0.0 - 0.2 mg/dL   Indirect Bilirubin NOT CALCULATED 0.3 - 0.9 mg/dL  Lipase, blood     Status: None   Collection Time: 07/19/19  2:40 AM  Result Value Ref Range   Lipase 32 11 - 51 U/L   Imaging Studies: Dg Chest 2 View  Result Date: 07/19/2019 CLINICAL DATA:  Cough, chest pain, sweats EXAM: CHEST - 2 VIEW COMPARISON:  Radiograph 11/03/2014 FINDINGS: Stable bandlike region of scarring in the periphery of the left lung base. No consolidation, features of edema, pneumothorax, or effusion. Pulmonary vascularity is normally distributed. The cardiomediastinal contours are unremarkable. No acute osseous or soft tissue abnormality. IMPRESSION:  No acute cardiopulmonary abnormality. Stable left basilar scarring. Electronically Signed   By: Lovena Le M.D.   On: 07/19/2019 04:35    ED COURSE and MDM  Nursing notes and initial vitals signs, including pulse oximetry, reviewed.  Vitals:   07/19/19 0216 07/19/19 0223 07/19/19 0230 07/19/19 0400  BP: (!) 141/93  138/81 126/85  Pulse: 76  83 73  Resp: 17  13 19   Temp:  98.8 F (37.1 C)    SpO2: 99%  98% 98%  Weight:      Height:       Work-up in ED is unremarkable.  I suspect his symptoms may be related to his sleep apnea and CPAP.  He only started using CPAP in January of this year although he suspects he has had sleep apnea for many years.  He is still in the process of getting his CPAP settings adjusted.  He was advised to contact his physician for further evaluation.  PROCEDURES    ED DIAGNOSES     ICD-10-CM   1. Atypical chest pain  R07.89   2. Obstructive sleep apnea treated with continuous positive airway pressure (CPAP)  G47.33    Z99.89        Saryna Kneeland, Jenny Reichmann, MD 07/19/19 [phone]

## 2019-07-19 NOTE — ED Notes (Signed)
Pt ambulatory to [facility]

## 2019-07-19 NOTE — ED Triage Notes (Signed)
Pt reports waking up in the middle of the night with sweats and coughing spells. Pt wears Cpap. Pt also reports intermittent chest pain x 1 week but reports hx of acid reflux.

## 2019-07-24 ENCOUNTER — Other Ambulatory Visit: Payer: Self-pay | Admitting: Internal Medicine

## 2019-07-24 NOTE — Telephone Encounter (Signed)
Okay to refill Qvar, 1 and 1 refill. Please arrange CPX, he is due.

## 2019-07-24 NOTE — Telephone Encounter (Signed)
No future visit scheduled. Is he due for visit?

## 2019-07-24 NOTE — Telephone Encounter (Signed)
Refill request coming from [facility]- 90 day supply sent. Letter mail to Pt informing overdue for CPX.

## 2019-07-30 DIAGNOSIS — G4733 Obstructive sleep apnea (adult) (pediatric): Secondary | ICD-10-CM | POA: Diagnosis not present

## 2019-08-09 ENCOUNTER — Other Ambulatory Visit: Payer: Self-pay

## 2019-08-09 ENCOUNTER — Encounter: Payer: Self-pay | Admitting: Internal Medicine

## 2019-08-09 ENCOUNTER — Ambulatory Visit (HOSPITAL_COMMUNITY)
Admission: RE | Admit: 2019-08-09 | Discharge: 2019-08-09 | Disposition: A | Discharge status: Home / Self Care | Payer: BC Managed Care – PPO | Source: Ambulatory Visit | Attending: Gastroenterology | Admitting: Gastroenterology

## 2019-08-09 DIAGNOSIS — R110 Nausea: Secondary | ICD-10-CM | POA: Diagnosis not present

## 2019-08-09 DIAGNOSIS — Z20822 Contact with and (suspected) exposure to covid-19: Secondary | ICD-10-CM

## 2019-08-09 DIAGNOSIS — K219 Gastro-esophageal reflux disease without esophagitis: Secondary | ICD-10-CM

## 2019-08-09 DIAGNOSIS — Z20828 Contact with and (suspected) exposure to other viral communicable diseases: Secondary | ICD-10-CM

## 2019-08-09 DIAGNOSIS — R109 Unspecified abdominal pain: Secondary | ICD-10-CM | POA: Diagnosis not present

## 2019-08-09 MED ORDER — TECHNETIUM TC 99M SULFUR COLLOID
2.0000 | Freq: Once | INTRAVENOUS | Status: AC | PRN
  Administered 2019-08-09: 2 via INTRAVENOUS

## 2019-08-09 NOTE — Telephone Encounter (Signed)
FYI. Sending Pt for testing at [facility].

## 2019-08-10 ENCOUNTER — Other Ambulatory Visit: Payer: Self-pay

## 2019-08-10 MED ORDER — METOCLOPRAMIDE HCL 5 MG PO TABS: 5.0000 mg | ORAL_TABLET | Freq: Three times a day (TID) | ORAL | 0 refills | Status: DC

## 2019-08-11 LAB — NOVEL CORONAVIRUS, NAA: SARS-CoV-2, NAA: NOT DETECTED

## 2019-08-21 ENCOUNTER — Other Ambulatory Visit: Payer: Self-pay

## 2019-08-22 ENCOUNTER — Ambulatory Visit: Payer: BC Managed Care – PPO | Admitting: Internal Medicine

## 2019-08-22 ENCOUNTER — Other Ambulatory Visit: Payer: Self-pay

## 2019-08-22 ENCOUNTER — Encounter: Payer: Self-pay | Admitting: Internal Medicine

## 2019-08-22 VITALS — BP 115/73 | HR 95 | Temp 97.3°F | Resp 16 | Ht 72.0 in | Wt 266.2 lb

## 2019-08-22 DIAGNOSIS — E785 Hyperlipidemia, unspecified: Secondary | ICD-10-CM

## 2019-08-22 DIAGNOSIS — E119 Type 2 diabetes mellitus without complications: Secondary | ICD-10-CM

## 2019-08-22 DIAGNOSIS — R0600 Dyspnea, unspecified: Secondary | ICD-10-CM

## 2019-08-22 DIAGNOSIS — R0609 Other forms of dyspnea: Secondary | ICD-10-CM

## 2019-08-22 NOTE — Patient Instructions (Addendum)
Per our records you are due for an eye exam. Please contact your eye doctor to schedule an appointment. Please have them send copies of your office visit notes to [place]. Our fax number is [phone].  GO TO THE LAB : Get the blood work     GO TO THE FRONT DESK Schedule your next appointment for a physical exam in 4 months, fasting

## 2019-08-22 NOTE — Progress Notes (Signed)
Pre visit review using our clinic review tool, if applicable. No additional management support is needed unless otherwise documented below in the visit note. 

## 2019-08-22 NOTE — Progress Notes (Signed)
Subjective:    Patient ID: Wayne Melton, male    DOB: 08/06/1971, 48 y.o.   MRN: [id number]  DOS:  08/22/2019 Type of visit - description: Follow-up The patient main concern is to get his diabetes and cholesterol check. Admit he is not exercising at all. Diet is typically okay. Diagnosed with gastroparesis recently, taking Reglan but having side effects, mostly feeling flushed and tired  Review of Systems Reports DOE, this is a chronic problem, occasionally he hears some wheezing when he is active.  Past Medical History:  Diagnosis Date  . Allergic rhinitis 07/05/2012  . Constipation    chronic  . Diabetes mellitus without complication (HCC)    No meds  . GERD , HH, h/o PUD 07/05/2012  . Hyperlipidemia   . IBS (irritable bowel syndrome) 11/06/2012  . Obesity   . OSA (obstructive sleep apnea)    mild  . Seasonal allergies   . Sleep apnea     Past Surgical History:  Procedure Laterality Date  . COLONOSCOPY    . UPPER GASTROINTESTINAL ENDOSCOPY      Social History   Socioeconomic History  . Marital status: Married    Spouse name: Not on file  . Number of children: 3  . Years of education: Not on file  . Highest education level: Not on file  Occupational History  . Occupation: busines Administrator, computers  Social Needs  . Financial resource strain: Not on file  . Food insecurity    Worry: Not on file    Inability: Not on file  . Transportation needs    Medical: Not on file    Non-medical: Not on file  Tobacco Use  . Smoking status: Current Every Day Smoker    Packs/day: 0.50    Years: 3.00    Pack years: 1.50    Types: Cigarettes    Last attempt to quit: 10/04/2008    Years since quitting: 10.8  . Smokeless tobacco: Never Used  Substance and Sexual Activity  . Alcohol use: No    Alcohol/week: 0.0 standard drinks    Comment: rare  . Drug use: No  . Sexual activity: Not on file  Lifestyle  . Physical activity    Days per week: Not on file    Minutes  per session: Not on file  . Stress: Not on file  Relationships  . Social Herbalist on phone: Not on file    Gets together: Not on file    Attends religious service: Not on file    Active member of club or organization: Not on file    Attends meetings of clubs or organizations: Not on file    Relationship status: Not on file  . Intimate partner violence    Fear of current or ex partner: Not on file    Emotionally abused: Not on file    Physically abused: Not on file    Forced sexual activity: Not on file  Other Topics Concern  . Not on file  Social History Narrative   Household: pt, wife,    2001, 2007, 2008          Allergies as of 08/22/2019   No Known Allergies     Medication List       Accurate as of August 22, 2019 11:59 PM. If you have any questions, ask your nurse or doctor.        acetaminophen 500 MG tablet Commonly known as: TYLENOL Take 1,000 mg  by mouth every 6 (six) hours as needed for moderate pain.   azelastine 0.1 % nasal spray Commonly known as: ASTELIN Place 2 sprays into both nostrils at bedtime as needed for rhinitis or allergies. Use in each nostril as directed   cetirizine 10 MG tablet Commonly known as: ZYRTEC Take 10 mg by mouth daily.   Citrucel oral powder Generic drug: methylcellulose Take 1 packet by mouth daily.   dexlansoprazole 60 MG capsule Commonly known as: DEXILANT Take 1 capsule (60 mg total) by mouth 2 (two) times daily. What changed: when to take this   fluticasone 50 MCG/ACT nasal spray Commonly known as: FLONASE Place 2 sprays into both nostrils daily.   Gaviscon 80-14.2 MG Chew Generic drug: Alum Hydroxide-Mag Trisilicate Chew 1 capsule by mouth daily as needed.   Hyoscyamine Sulfate SL 0.125 MG Subl Commonly known as: Levsin/SL Place 0.125 mg under the tongue every 4 (four) hours as needed (abd pain).   metoCLOPramide 5 MG tablet Commonly known as: Reglan Take 1 tablet (5 mg total) by mouth 3  (three) times daily before meals. May increase to 10mg  - 3 times daily before meals if needed.   montelukast 10 MG tablet Commonly known as: SINGULAIR Take 1 tablet (10 mg total) by mouth at bedtime.   naproxen 375 MG tablet Commonly known as: NAPROSYN Take 1 tablet (375 mg total) by mouth 2 (two) times daily.   ondansetron 4 MG disintegrating tablet Commonly known as: Zofran ODT Take 1 tablet (4 mg total) by mouth every 6 (six) hours as needed for nausea or vomiting.   polyethylene glycol 17 g packet Commonly known as: MiraLax Take 17 g by mouth daily.   Qvar RediHaler 80 MCG/ACT inhaler Generic drug: beclomethasone Inhale 2 puffs into the lungs daily.   sucralfate 1 g tablet Commonly known as: CARAFATE Take 1 tablet (1 g total) by mouth 4 (four) times daily -  with meals and at bedtime.           Objective:   Physical Exam BP 115/73 (BP Location: Left Arm, Patient Position: Sitting, Cuff Size: Normal)   Pulse 95   Temp (!) 97.3 F (36.3 C) (Temporal)   Resp 16   Ht 6' (1.829 m)   Wt 266 lb 4 oz (120.8 kg)   SpO2 99%   BMI 36.11 kg/m  General:   Well developed, NAD, BMI noted. HEENT:  Normocephalic . Face symmetric, atraumatic Lungs:  CTA B Normal respiratory effort, no intercostal retractions, no accessory muscle use. Heart: RRR,  no murmur.  Trace pretibial edema bilaterally  Skin: Not pale. Not jaundice Neurologic:  alert & oriented X3.  Speech normal, gait appropriate for age and unassisted Psych--  Cognition and judgment appear intact.  Cooperative with normal attention span and concentration.  Behavior appropriate. No anxious or depressed appearing.       Assessment     Assessment DM Hyperlipidemia Bronchospasm (on Qvar) GI: --History of PUD (EGD @ [facility]  2002: neg  but reports had another EGD with ulcer) --GERD, IBS, occ constipation, gastroparesis (+ gastric emptying study October 2020) -- fatty liver per u/s 06-2015 -EGD 08-2017:  Gastritis, polyps, benign findings per GI letter, no follow-up OSA:  (-)  home sleep apnea test 2014,  Full sleep study~08-2015 : mild OSA, saw pulmonary, rx dental appliance, wt loss HST 08-2018: Rx CPAP - DOE x years>>>>  neg stress test 03/2015  PLAN DM: Currently on no medications, not exercising at all, diet is  okay.  We will check a A1c and micro.  Strongly encourage to increase his physical activity.  He is somewhat limited by DOE, see below. Hyperlipidemia: Not on medicines, check a lipid panel, his nonfasting DOE: This is a chronic issue for you few years, occasionally has cough if he tries to exercise too much.  DOE likely multifactorial including deconditioning and possible bronchospasm.  Rx to discuss with pulmonary, PFTs?. GERD, IBS, gastroparesis: Not tolerating well Reglan, per GI. Preventive care: Declined flu shot, benefits discussed. RTC 4 months CPX

## 2019-08-23 DIAGNOSIS — R0609 Other forms of dyspnea: Secondary | ICD-10-CM | POA: Insufficient documentation

## 2019-08-23 LAB — MICROALBUMIN / CREATININE URINE RATIO
Creatinine,U: 94 mg/dL
Microalb Creat Ratio: 0.7 mg/g (ref 0.0–30.0)
Microalb, Ur: <0.7 mg/dL (ref 0.0–1.9)

## 2019-08-23 LAB — LIPID PANEL
Cholesterol: 276 mg/dL — ABNORMAL HIGH (ref 0–200)
HDL: 27.9 mg/dL — ABNORMAL LOW (ref >39.00)
Total CHOL/HDL Ratio: 10
Triglycerides: 705 mg/dL — ABNORMAL HIGH (ref 0.0–149.0)

## 2019-08-23 LAB — LDL CHOLESTEROL, DIRECT: Direct LDL: 151 mg/dL

## 2019-08-23 LAB — HEMOGLOBIN A1C: Hgb A1c MFr Bld: 7.1 % — ABNORMAL HIGH (ref 4.6–6.5)

## 2019-08-23 NOTE — Assessment & Plan Note (Signed)
DM: Currently on no medications, not exercising at all, diet is okay.  We will check a A1c and micro.  Strongly encourage to increase his physical activity.  He is somewhat limited by DOE, see below. Hyperlipidemia: Not on medicines, check a lipid panel, his nonfasting DOE: This is a chronic issue for you few years, occasionally has cough if he tries to exercise too much.  DOE likely multifactorial including deconditioning and possible bronchospasm.  Rx to discuss with pulmonary, PFTs?. GERD, IBS, gastroparesis: Not tolerating well Reglan, per GI. Preventive care: Declined flu shot, benefits discussed. RTC 4 months CPX

## 2019-08-27 MED ORDER — METFORMIN HCL 850 MG PO TABS: ORAL_TABLET | ORAL | 4 refills | Status: DC

## 2019-08-27 NOTE — Addendum Note (Signed)
Addended by: Damita Dunnings D on: 08/27/2019 11:05 AM   Modules accepted: Orders

## 2019-08-29 ENCOUNTER — Other Ambulatory Visit: Payer: Self-pay

## 2019-08-29 ENCOUNTER — Other Ambulatory Visit: Payer: Self-pay | Admitting: Gastroenterology

## 2019-08-29 MED ORDER — METOCLOPRAMIDE HCL 5 MG PO TABS: 5.0000 mg | ORAL_TABLET | Freq: Three times a day (TID) | ORAL | 0 refills | Status: DC

## 2019-09-19 ENCOUNTER — Other Ambulatory Visit: Payer: Self-pay

## 2019-09-19 ENCOUNTER — Other Ambulatory Visit: Payer: Self-pay | Admitting: Gastroenterology

## 2019-09-19 MED ORDER — METOCLOPRAMIDE HCL 5 MG PO TABS: 5.0000 mg | ORAL_TABLET | Freq: Three times a day (TID) | ORAL | 1 refills | Status: DC

## 2019-09-24 ENCOUNTER — Ambulatory Visit: Payer: BC Managed Care – PPO | Admitting: Gastroenterology

## 2019-09-24 ENCOUNTER — Encounter: Payer: Self-pay | Admitting: Gastroenterology

## 2019-09-24 VITALS — BP 124/77 | HR 87 | Temp 98.6°F | Ht 72.0 in | Wt 269.0 lb

## 2019-09-24 DIAGNOSIS — K219 Gastro-esophageal reflux disease without esophagitis: Secondary | ICD-10-CM | POA: Diagnosis not present

## 2019-09-24 DIAGNOSIS — K3184 Gastroparesis: Secondary | ICD-10-CM

## 2019-09-24 DIAGNOSIS — R194 Change in bowel habit: Secondary | ICD-10-CM | POA: Diagnosis not present

## 2019-09-24 MED ORDER — OMEPRAZOLE 40 MG PO CPDR: 40.0000 mg | DELAYED_RELEASE_CAPSULE | Freq: Two times a day (BID) | ORAL | 1 refills | Status: DC

## 2019-09-24 MED ORDER — ONDANSETRON 4 MG PO TBDP: 4.0000 mg | ORAL_TABLET | Freq: Four times a day (QID) | ORAL | 3 refills | Status: DC | PRN

## 2019-09-24 MED ORDER — METOCLOPRAMIDE HCL 5 MG PO TABS: 5.0000 mg | ORAL_TABLET | Freq: Three times a day (TID) | ORAL | 1 refills | Status: DC

## 2019-09-24 NOTE — Progress Notes (Signed)
HPI :  48 year old male here for a follow-up visit.  We have seen him in the past for reflux, dyspepsia.  At her last visit he was taking Dexilant 60 mg a day and Carafate as needed, this was doing a decent job at controlling his pyrosis and heartburn however he was still having early satiety and some dyspepsia within a few hours of eating.  Please recall he had an EGD in November 2018 which showed a few small linear ulcerations and some benign gastric polyps, negative biopsies for H. pylori and no esophagitis.  He is also had a prior CT scan and ultrasound of his right upper quadrant to evaluate abdominal pain which did not show any clear source otherwise.  I referred him for a gastric emptying study on November 5, this was abnormal and positive for gastroparesis, as outlined below.  He also followed up with Dr. Larose Kells his primary care, his hemoglobin A1c has risen into the sevens and he was started on Metformin.  I counseled him on gastroparesis and tried him on some low-dose Reglan 5 mg a few times a day.  He states in general he is feeling better in regards to his early satiety and dyspepsia since he started the Reglan.  He has been tolerating a low dose at 5 mg twice a day.  He is not having any vomiting, occasional nausea.  He has tried FD guard in the past without much help.  The Metformin seems to upset his stomach little bit, he does have a little bit of irritation in his bowel from this.  He does alternate between hard stools and looser stools at times.  We had discussed using fiber supplement in the past but he has not really used it reliably.  He thinks it does help him when he takes it.  He otherwise questions if we can switch him off the Dexilant to something else, he is concerned about cost of the drug in light of insurance changes in the next year.   Prior workup: EGD 08/16/2017 - few small linear based ulcers, normal esophagus, multiple small gastric polyps, - benign  biopsies Colonoscopy 12/09/15 - 41mm transverse TA, otherwise normal exam    US abdomen 10/23/18 - diffuse hepatic steatosis,Two hypoechoic liver masses which were not definitely seen on previous study, but are suspicious for areas of benign focal fatty sparing.  CT abdomen / pelvis - 11/04/18 -IMPRESSION: 1. No acute intra-abdominal process. 2. Hepatic steatosis.   GES 08/09/19 - FINDINGS: 23.7 emptied at 1 hr ( normal >= 10%)  31.0 emptied at 2 hr ( normal >= 40%)  31.3 emptied at 3 hr ( normal >= 70%)  29.5 emptied at 4 hr ( normal >= 90%)  IMPRESSION: Delayed gastric emptying study.  Findings suggest gastroparesis.   Past Medical History:  Diagnosis Date  . Allergic rhinitis 07/05/2012  . Constipation    chronic  . Diabetes mellitus without complication (HCC)    No meds  . Gastroparesis   . GERD , HH, h/o PUD 07/05/2012  . Hyperlipidemia   . IBS (irritable bowel syndrome) 11/06/2012  . Obesity   . OSA (obstructive sleep apnea)    mild  . Seasonal allergies   . Sleep apnea      Past Surgical History:  Procedure Laterality Date  . COLONOSCOPY    . UPPER GASTROINTESTINAL ENDOSCOPY     Family History  Problem Relation Age of Onset  . Pancreatitis Mother   . Diabetes Mother   .  Hyperlipidemia Mother   . Colon polyps Mother        late 7s  . Mitral valve prolapse Mother   . Colon cancer Maternal Uncle        uncle in his 38s  . Epilepsy Father   . Colon cancer Maternal Grandfather   . CAD Other        GF in his 33s  . Irritable bowel syndrome Other   . Colitis Other   . Heart disease Other   . Prostate cancer Neg Hx    Social History   Tobacco Use  . Smoking status: Current Every Day Smoker    Packs/day: 0.50    Years: 3.00    Pack years: 1.50    Types: Cigarettes    Last attempt to quit: 10/04/2008    Years since quitting: 10.9  . Smokeless tobacco: Never Used  Substance Use Topics  . Alcohol use: No    Alcohol/week: 0.0 standard drinks     Comment: rare  . Drug use: No   Current Outpatient Medications  Medication Sig Dispense Refill  . acetaminophen (TYLENOL) 500 MG tablet Take 1,000 mg by mouth every 6 (six) hours as needed for moderate pain.    Marland Kitchen Alum Hydroxide-Mag Trisilicate (GAVISCON) [id number] MG CHEW Chew 1 capsule by mouth daily as needed. 224 tablet   . azelastine (ASTELIN) 0.1 % nasal spray Place 2 sprays into both nostrils at bedtime as needed for rhinitis or allergies. Use in each nostril as directed 30 mL 5  . beclomethasone (QVAR REDIHALER) 80 MCG/ACT inhaler Inhale 2 puffs into the lungs daily. 31.8 g 0  . cetirizine (ZYRTEC) 10 MG tablet Take 10 mg by mouth daily.    . fluticasone (FLONASE) 50 MCG/ACT nasal spray Place 2 sprays into both nostrils daily. 16 g 0  . Hyoscyamine Sulfate SL (LEVSIN/SL) 0.125 MG SUBL Place 0.125 mg under the tongue every 4 (four) hours as needed (abd pain). 120 each 0  . metFORMIN (GLUCOPHAGE) 850 MG tablet Take 1/2 tablet by mouth twice daily for 1 week, then increase to 1 tablet by mouth twice daily 60 tablet 4  . metoCLOPramide (REGLAN) 5 MG tablet Take 1 tablet (5 mg total) by mouth 3 (three) times daily before meals. May increase to 10mg  - 3 times daily before meals if needed. 90 tablet 1  . polyethylene glycol (MIRALAX) 17 g packet Take 17 g by mouth daily. 14 each 0  . omeprazole (PRILOSEC) 40 MG capsule Take 1 capsule (40 mg total) by mouth 2 (two) times daily. 180 capsule 1  . ondansetron (ZOFRAN ODT) 4 MG disintegrating tablet Take 1 tablet (4 mg total) by mouth every 6 (six) hours as needed for nausea or vomiting. 30 tablet 3   No current facility-administered medications for this visit.   No Known Allergies   Review of Systems: All systems reviewed and negative except where noted in HPI.   Lab Results  Component Value Date   WBC 9.9 07/19/2019   HGB 15.2 07/19/2019   HCT 46.7 07/19/2019   MCV 91.0 07/19/2019   PLT 309 07/19/2019    Lab Results  Component Value  Date   CREATININE 0.78 07/19/2019   BUN 13 07/19/2019   NA 138 07/19/2019   K 3.6 07/19/2019   CL 105 07/19/2019   CO2 21 (L) 07/19/2019    Lab Results  Component Value Date   ALT 38 07/19/2019   AST 14 (L) 07/19/2019   ALKPHOS 92  07/19/2019   BILITOT 0.6 07/19/2019     Physical Exam: BP 124/77   Pulse 87   Temp 98.6 F (37 C)   Ht 6' (1.829 m)   Wt 269 lb (122 kg)   SpO2 98%   BMI 36.48 kg/m  Constitutional: Pleasant,well-developed, male in no acute distress. HEENT: Normocephalic and atraumatic. Conjunctivae are normal. No scleral icterus. Neck supple.  Cardiovascular: Normal rate, regular rhythm.  Pulmonary/chest: Effort normal and breath sounds normal. No wheezing, rales or rhonchi. Abdominal: Soft, nondistended, nontender. There are no masses palpable. No hepatomegaly. Extremities: no edema Lymphadenopathy: No cervical adenopathy noted. Neurological: Alert and oriented to person place and time. Skin: Skin is warm and dry. No rashes noted. Psychiatric: Normal mood and affect. Behavior is normal.   ASSESSMENT AND PLAN: 48 year old male here for reassessment of the following:  Gastroparesis / GERD - underlying gastroparesis in the setting of mild diabetes.  We discussed what this is and management options.  We discussed dietary changes, eating more frequent smaller meals as opposed to larger and frequent meals, he has already been given dietary handouts about this.  He has been treated for diabetes since I've last seen him and hopefully reduction in his blood sugars will help with this.  We otherwise discussed medical options for treatment of gastroparesis, namely Reglan, domperidone, potentially Motegrity.  We discussed risk and benefits of each of these, he has been using a low-dose of Reglan and is working pretty well for him at that dose.  We discussed risks of tar dive dyskinesia at higher dosing and FDA restrictions on this medication.  He wants to continue 5 mg a  few times a day as needed and wants to minimize the dose of this and see if he can improve with better control of glucose over time.  Regarding his reflux, this is pretty well controlled at this point with the [place], we discussed options to get him off of this to reduce cost.  We will transition him to omeprazole 40 mg twice a day and can reduce to once a day as tolerated.  Hopefully with treatment of the gastroparesis his reflux will be less bothersome.  He agreed he can otherwise continue to use Zofran as needed for nausea that bothers him intermittently. I will see him back in 6 months for reassessment.   Altered bowel habits - relatively recent colonoscopy.  Continues to have alternating constipation and diarrhea.  I previously recommended Citrucel for this once daily.  He thinks this does help some but has a hard time with compliance.  He will try to use it more frequently which I think might provide some better long-term effects.  He agreed  Fountain Cellar, MD [facility]

## 2019-09-24 NOTE — Patient Instructions (Signed)
If you are age 46 or older, your body mass index should be between 23-30. Your Body mass index is 36.48 kg/m. If this is out of the aforementioned range listed, please consider follow up with your Primary Care Provider.  If you are age 44 or younger, your body mass index should be between 19-25. Your Body mass index is 36.48 kg/m. If this is out of the aformentioned range listed, please consider follow up with your Primary Care Provider.   We have sent the following medications to your pharmacy for you to pick up at your convenience:  1. Stop Dexilant. 2. Omeprazole 40 mg twice daily. 3. Reglan 5 mg 2-3 times daily as needed. 4. Zofran refilled.

## 2019-09-30 DIAGNOSIS — G4733 Obstructive sleep apnea (adult) (pediatric): Secondary | ICD-10-CM | POA: Diagnosis not present

## 2019-10-07 DIAGNOSIS — S83242A Other tear of medial meniscus, current injury, left knee, initial encounter: Secondary | ICD-10-CM | POA: Diagnosis not present

## 2019-10-09 ENCOUNTER — Encounter: Payer: Self-pay | Admitting: Internal Medicine

## 2019-10-09 DIAGNOSIS — S83242A Other tear of medial meniscus, current injury, left knee, initial encounter: Secondary | ICD-10-CM | POA: Diagnosis not present

## 2019-10-11 DIAGNOSIS — M25562 Pain in left knee: Secondary | ICD-10-CM | POA: Diagnosis not present

## 2019-10-18 DIAGNOSIS — S83242A Other tear of medial meniscus, current injury, left knee, initial encounter: Secondary | ICD-10-CM | POA: Diagnosis not present

## 2019-10-23 ENCOUNTER — Telehealth: Payer: Self-pay

## 2019-10-23 NOTE — Telephone Encounter (Signed)
Spoke w/ Pt- denies having SOB, chest pain. Form in PCP red folder for completion.

## 2019-10-23 NOTE — Telephone Encounter (Signed)
Form faxed to ATTN: Sherri at [facility] at [phone] w/ OV note from 08/22/2019, EKG from 07/19/2019 and labs from 08/22/2019. Form sent for scanning.

## 2019-10-23 NOTE — Telephone Encounter (Signed)
Received fax confirmation

## 2019-10-23 NOTE — Telephone Encounter (Signed)
signed

## 2019-10-23 NOTE — Telephone Encounter (Signed)
Patient is diabetic, last A1c 7.1, last EKG 07/19/2019 at baseline. History of negative stress test in 2016. Advised patient: If he is not having chest pain, difficulty breathing he is cleared for surgery.

## 2019-10-23 NOTE — Telephone Encounter (Signed)
Received surgical clearance form from [facility]. Pt needing L knee ACL reconstruction w/ Dr. Elsie Saas.   Dr. Larose Kells- last [place] 08/2019 and last EKG 07/2019- does he need surgical clearance appt?

## 2019-11-03 ENCOUNTER — Other Ambulatory Visit: Payer: Self-pay | Admitting: Internal Medicine

## 2019-11-05 ENCOUNTER — Other Ambulatory Visit: Payer: Self-pay

## 2019-11-05 DIAGNOSIS — S83512A Sprain of anterior cruciate ligament of left knee, initial encounter: Secondary | ICD-10-CM | POA: Diagnosis not present

## 2019-11-05 MED ORDER — METFORMIN HCL 850 MG PO TABS: 850.0000 mg | ORAL_TABLET | Freq: Two times a day (BID) | ORAL | 1 refills | Status: DC

## 2019-11-08 ENCOUNTER — Other Ambulatory Visit: Payer: Self-pay

## 2019-11-08 MED ORDER — OMEPRAZOLE 40 MG PO CPDR: 40.0000 mg | DELAYED_RELEASE_CAPSULE | Freq: Two times a day (BID) | ORAL | 1 refills | Status: DC

## 2019-11-13 DIAGNOSIS — G4733 Obstructive sleep apnea (adult) (pediatric): Secondary | ICD-10-CM | POA: Diagnosis not present

## 2019-12-17 ENCOUNTER — Other Ambulatory Visit: Payer: Self-pay | Admitting: Gastroenterology

## 2019-12-18 DIAGNOSIS — K3184 Gastroparesis: Secondary | ICD-10-CM | POA: Insufficient documentation

## 2019-12-19 ENCOUNTER — Ambulatory Visit (INDEPENDENT_AMBULATORY_CARE_PROVIDER_SITE_OTHER): Payer: BC Managed Care – PPO | Admitting: Internal Medicine

## 2019-12-19 ENCOUNTER — Encounter: Payer: Self-pay | Admitting: Internal Medicine

## 2019-12-19 ENCOUNTER — Other Ambulatory Visit: Payer: Self-pay

## 2019-12-19 VITALS — BP 111/74 | HR 77 | Temp 96.8°F | Resp 18 | Ht 72.0 in | Wt 264.1 lb

## 2019-12-19 DIAGNOSIS — Z0000 Encounter for general adult medical examination without abnormal findings: Secondary | ICD-10-CM

## 2019-12-19 DIAGNOSIS — E119 Type 2 diabetes mellitus without complications: Secondary | ICD-10-CM

## 2019-12-19 DIAGNOSIS — K3184 Gastroparesis: Secondary | ICD-10-CM

## 2019-12-19 DIAGNOSIS — E785 Hyperlipidemia, unspecified: Secondary | ICD-10-CM

## 2019-12-19 NOTE — Progress Notes (Signed)
Subjective:    Patient ID: Wayne Melton, male    DOB: [birth year], 49 y.o.   MRN: [id number]  DOS:  12/19/2019 Type of visit - description: CPX Since the last visit, had a left knee injury injury.  Surgery pending.  Wt Readings from Last 3 Encounters:  12/19/19 264 lb 2 oz (119.8 kg)  09/24/19 269 lb (122 kg)  08/22/19 266 lb 4 oz (120.8 kg)     Review of Systems Chronic GI and respiratory symptoms at baseline.  Relatively well controlled.  Other than above, a 14 point review of systems is negative     Past Medical History:  Diagnosis Date  . Allergic rhinitis 07/05/2012  . Constipation    chronic  . Diabetes mellitus without complication (HCC)    No meds  . Gastroparesis   . GERD , HH, h/o PUD 07/05/2012  . Hyperlipidemia   . IBS (irritable bowel syndrome) 11/06/2012  . Obesity   . OSA (obstructive sleep apnea)    mild  . Seasonal allergies   . Sleep apnea     Past Surgical History:  Procedure Laterality Date  . COLONOSCOPY    . UPPER GASTROINTESTINAL ENDOSCOPY     Family History  Problem Relation Age of Onset  . Pancreatitis Mother   . Diabetes Mother   . Hyperlipidemia Mother   . Colon polyps Mother        late 22s  . Mitral valve prolapse Mother   . Colon cancer Maternal Uncle        uncle in his 71s  . Epilepsy Father   . Colon cancer Maternal Grandfather   . CAD Other        GF in his 65s  . Irritable bowel syndrome Other   . Colitis Other   . Heart disease Other   . Prostate cancer Neg Hx      Allergies as of 12/19/2019   No Known Allergies     Medication List       Accurate as of December 19, 2019 11:59 PM. If you have any questions, ask your nurse or doctor.        acetaminophen 500 MG tablet Commonly known as: TYLENOL Take 1,000 mg by mouth every 6 (six) hours as needed for moderate pain.   azelastine 0.1 % nasal spray Commonly known as: ASTELIN Place 2 sprays into both nostrils at bedtime as needed for rhinitis or allergies.  Use in each nostril as directed   cetirizine 10 MG tablet Commonly known as: ZYRTEC Take 10 mg by mouth daily.   fluticasone 50 MCG/ACT nasal spray Commonly known as: FLONASE Place 2 sprays into both nostrils daily.   Gaviscon 80-14.2 MG Chew Generic drug: Alum Hydroxide-Mag Trisilicate Chew 1 capsule by mouth daily as needed.   Hyoscyamine Sulfate SL 0.125 MG Subl Commonly known as: Levsin/SL Place 0.125 mg under the tongue every 4 (four) hours as needed (abd pain).   metFORMIN 850 MG tablet Commonly known as: Glucophage Take 1 tablet (850 mg total) by mouth 2 (two) times daily with a meal.   metoCLOPramide 5 MG tablet Commonly known as: REGLAN Take 1 tablet (5 mg total) by mouth 3 (three) times daily as needed for nausea.   omeprazole 40 MG capsule Commonly known as: PRILOSEC Take 1 capsule (40 mg total) by mouth 2 (two) times daily.   ondansetron 4 MG disintegrating tablet Commonly known as: Zofran ODT Take 1 tablet (4 mg total) by mouth every 6 (  six) hours as needed for nausea or vomiting.   polyethylene glycol 17 g packet Commonly known as: MiraLax Take 17 g by mouth daily.   Qvar RediHaler 80 MCG/ACT inhaler Generic drug: beclomethasone Inhale 2 puffs into the lungs daily.          Objective:   Physical Exam BP 111/74 (BP Location: Left Arm, Patient Position: Sitting, Cuff Size: Normal)   Pulse 77   Temp (!) 96.8 F (36 C) (Temporal)   Resp 18   Ht 6' (1.829 m)   Wt 264 lb 2 oz (119.8 kg)   SpO2 99%   BMI 35.82 kg/m  General: Well developed, NAD, BMI noted Neck: No  thyromegaly  HEENT:  Normocephalic . Face symmetric, atraumatic Lungs:  CTA B Normal respiratory effort, no intercostal retractions, no accessory muscle use. Heart: RRR,  no murmur.  Abdomen:  Not distended, soft, non-tender. No rebound or rigidity.   DM foot exam: No edema, good pedal pulses, pinprick examination normal Skin: Exposed areas without rash. Not pale. Not jaundice  Neurologic:  alert & oriented X3.  Speech normal, gait appropriate for age and unassisted Strength symmetric and appropriate for age.  Psych: Cognition and judgment appear intact.  Cooperative with normal attention span and concentration.  Behavior appropriate. No anxious or depressed appearing.     Assessment     Assessment DM Hyperlipidemia Bronchospasm (on Qvar) GI: --History of PUD (EGD @ [facility]  2002: neg  but reports had another EGD with ulcer) --GERD, IBS, occ constipation, gastroparesis (+ gastric emptying study October 2020) -- fatty liver per u/s 06-2015 -EGD 08-2017: Gastritis, polyps, benign findings per GI letter, no follow-up OSA:  (-)  home sleep apnea test 2014,  Full sleep study~08-2015 : mild OSA, saw pulmonary, rx dental appliance, wt loss HST 08-2018: Rx CPAP - DOE x years>>>>  neg stress test 03/2015  PLAN Here for CPX DM: On Metformin, check A1c, foot exam normal Hyperlipidemia: On no meds, check FLP, he is aware that with guidelines he most likely will need statins and/or fenofibrate. Bronchospasm: Stable on Qvar Left knee ACL injury: Surgery pending. RTC 4 months  This visit occurred during the SARS-CoV-2 public health emergency.  Safety protocols were in place, including screening questions prior to the visit, additional usage of staff PPE, and extensive cleaning of exam room while observing appropriate contact time as indicated for disinfecting solutions.

## 2019-12-19 NOTE — Progress Notes (Signed)
Pre visit review using our clinic review tool, if applicable. No additional management support is needed unless otherwise documented below in the visit note. 

## 2019-12-19 NOTE — Patient Instructions (Addendum)
Per our records you are due for an eye exam. Please contact your eye doctor to schedule an appointment. Please have them send copies of your office visit notes to [place]. Our fax number is [phone].  GO TO THE LAB : Get the blood work     GO TO [facility] back for a checkup in 4 months, please make an appointment

## 2019-12-20 LAB — CBC WITH DIFFERENTIAL/PLATELET
Basophils Absolute: 0.1 10*3/uL (ref 0.0–0.1)
Basophils Relative: 0.9 % (ref 0.0–3.0)
Eosinophils Absolute: 0.2 10*3/uL (ref 0.0–0.7)
Eosinophils Relative: 1.6 % (ref 0.0–5.0)
HCT: 43.7 % (ref 39.0–52.0)
Hemoglobin: 14.8 g/dL (ref 13.0–17.0)
Lymphocytes Relative: 22.3 % (ref 12.0–46.0)
Lymphs Abs: 2.2 10*3/uL (ref 0.7–4.0)
MCHC: 33.9 g/dL (ref 30.0–36.0)
MCV: 89.3 fl (ref 78.0–100.0)
Monocytes Absolute: 0.6 10*3/uL (ref 0.1–1.0)
Monocytes Relative: 6.2 % (ref 3.0–12.0)
Neutro Abs: 6.9 10*3/uL (ref 1.4–7.7)
Neutrophils Relative %: 69 % (ref 43.0–77.0)
Platelets: 333 10*3/uL (ref 150.0–400.0)
RBC: 4.89 Mil/uL (ref 4.22–5.81)
RDW: 13.5 % (ref 11.5–15.5)
WBC: 10 10*3/uL (ref 4.0–10.5)

## 2019-12-20 LAB — COMPREHENSIVE METABOLIC PANEL
ALT: 41 U/L (ref 0–53)
AST: 13 U/L (ref 0–37)
Albumin: 4.1 g/dL (ref 3.5–5.2)
Alkaline Phosphatase: 86 U/L (ref 39–117)
BUN: 11 mg/dL (ref 6–23)
CO2: 29 mEq/L (ref 19–32)
Calcium: 9.4 mg/dL (ref 8.4–10.5)
Chloride: 99 mEq/L (ref 96–112)
Creatinine, Ser: 0.93 mg/dL (ref 0.40–1.50)
GFR: 87.29 mL/min (ref >60.00)
Glucose, Bld: 92 mg/dL (ref 70–99)
Potassium: 4 mEq/L (ref 3.5–5.1)
Sodium: 136 mEq/L (ref 135–145)
Total Bilirubin: 0.5 mg/dL (ref 0.2–1.2)
Total Protein: 6.9 g/dL (ref 6.0–8.3)

## 2019-12-20 LAB — LIPID PANEL
Cholesterol: 291 mg/dL — ABNORMAL HIGH (ref 0–200)
HDL: 30.9 mg/dL — ABNORMAL LOW (ref >39.00)
Total CHOL/HDL Ratio: 9
Triglycerides: 503 mg/dL — ABNORMAL HIGH (ref 0.0–149.0)

## 2019-12-20 LAB — HEMOGLOBIN A1C: Hgb A1c MFr Bld: 6.5 % (ref 4.6–6.5)

## 2019-12-20 LAB — LDL CHOLESTEROL, DIRECT: Direct LDL: 164 mg/dL

## 2019-12-21 NOTE — Assessment & Plan Note (Signed)
Here for CPX DM: On Metformin, check A1c, foot exam normal Hyperlipidemia: On no meds, check FLP, he is aware that with guidelines he most likely will need statins and/or fenofibrate. Bronchospasm: Stable on Qvar Left knee ACL injury: Surgery pending. RTC 4 months

## 2019-12-21 NOTE — Assessment & Plan Note (Signed)
-  Td 2016 -  pnm shot: 2016; prevnar 2017 - covid shots recommended -CCS: +FH   colon polyps (mother, early 22s ) and colon cancer ( uncle, mid 10s); cscope 12-2015 , next per GI  -Diet and exercise discussed.  Exercise currently limited by knee injury. -Labs:    CMP FLP CBC A1c Tobacco: Options discussed, tips provided, will call when ready for medication possibly Chantix or Wellbutrin

## 2019-12-24 MED ORDER — ATORVASTATIN CALCIUM 20 MG PO TABS: 20.0000 mg | ORAL_TABLET | Freq: Every day | ORAL | 3 refills | Status: DC

## 2019-12-24 NOTE — Addendum Note (Signed)
Addended byDamita Dunnings D on: 12/24/2019 07:41 AM   Modules accepted: Orders

## 2019-12-28 ENCOUNTER — Encounter: Payer: Self-pay | Admitting: Internal Medicine

## 2020-01-01 DIAGNOSIS — G4733 Obstructive sleep apnea (adult) (pediatric): Secondary | ICD-10-CM | POA: Diagnosis not present

## 2020-01-21 ENCOUNTER — Encounter: Payer: Self-pay | Admitting: Internal Medicine

## 2020-02-05 ENCOUNTER — Other Ambulatory Visit: Payer: Self-pay

## 2020-02-05 ENCOUNTER — Other Ambulatory Visit (INDEPENDENT_AMBULATORY_CARE_PROVIDER_SITE_OTHER): Payer: 59

## 2020-02-05 DIAGNOSIS — E785 Hyperlipidemia, unspecified: Secondary | ICD-10-CM | POA: Diagnosis not present

## 2020-02-06 LAB — LIPID PANEL
Cholesterol: 171 mg/dL (ref 0–200)
HDL: 31.8 mg/dL — ABNORMAL LOW (ref >39.00)
NonHDL: 139.63
Total CHOL/HDL Ratio: 5
Triglycerides: 347 mg/dL — ABNORMAL HIGH (ref 0.0–149.0)
VLDL: 69.4 mg/dL — ABNORMAL HIGH (ref 0.0–40.0)

## 2020-02-06 LAB — LDL CHOLESTEROL, DIRECT: Direct LDL: 92 mg/dL

## 2020-02-06 LAB — ALT: ALT: 41 U/L (ref 0–53)

## 2020-02-06 LAB — AST: AST: 12 U/L (ref 0–37)

## 2020-02-08 MED ORDER — ATORVASTATIN CALCIUM 20 MG PO TABS: 20.0000 mg | ORAL_TABLET | Freq: Every day | ORAL | 3 refills | Status: DC

## 2020-02-08 NOTE — Addendum Note (Signed)
Addended byDamita Dunnings D on: 02/08/2020 02:42 PM   Modules accepted: Orders

## 2020-02-15 ENCOUNTER — Encounter: Payer: Self-pay | Admitting: Internal Medicine

## 2020-02-15 MED ORDER — ATORVASTATIN CALCIUM 20 MG PO TABS: 20.0000 mg | ORAL_TABLET | Freq: Every day | ORAL | 3 refills | Status: DC

## 2020-03-14 ENCOUNTER — Other Ambulatory Visit: Payer: Self-pay

## 2020-03-14 ENCOUNTER — Ambulatory Visit
Admission: EM | Admit: 2020-03-14 | Discharge: 2020-03-14 | Disposition: A | Discharge status: Home / Self Care | Payer: 59 | Attending: Physician Assistant | Admitting: Physician Assistant

## 2020-03-14 DIAGNOSIS — R197 Diarrhea, unspecified: Secondary | ICD-10-CM | POA: Diagnosis not present

## 2020-03-14 DIAGNOSIS — R110 Nausea: Secondary | ICD-10-CM

## 2020-03-14 MED ORDER — HYOSCYAMINE SULFATE SL 0.125 MG SL SUBL: 0.1250 mg | SUBLINGUAL_TABLET | SUBLINGUAL | 0 refills | Status: DC | PRN

## 2020-03-14 MED ORDER — ONDANSETRON 4 MG PO TBDP: 4.0000 mg | ORAL_TABLET | Freq: Four times a day (QID) | ORAL | 0 refills | Status: DC | PRN

## 2020-03-14 NOTE — Discharge Instructions (Signed)
Zofran for nausea and vomiting as needed. Hyoscyamine for abdominal cramping. Keep hydrated, you urine should be clear to pale yellow in color. Bland diet, advance as tolerated. Monitor for any worsening of symptoms, nausea or vomiting not controlled by medication, worsening abdominal pain, fever, go to the emergency department for further evaluation needed.

## 2020-03-14 NOTE — ED Triage Notes (Addendum)
Abdominal pain x 3 days "around my waistline" with diarrhea and "gas." Levsin & zofran was expired, has not tried today but states usually helps. Denies fever, nausea, vomiting.

## 2020-03-14 NOTE — ED Provider Notes (Signed)
[facility]    CSN: [id number] Arrival date & time: 03/14/20  0817      History   Chief Complaint Chief Complaint  Patient presents with  . Abdominal Pain    HPI Wayne Melton is a 49 y.o. male.   49 year old male with history of DM with gastroparesis, IBS, HLD comes in for 3-day history of abdominal pain, diarrhea, bloating. Patient states has episodes of similar symptoms every 3-4 weeks, though usually more mild and resolves after 1 day. Last episode like current has been 1 year ago. Has abdominal bloating, abdominal pain. Has 1 episode of diarrhea daily without melena, hematochezia. Intermittent nausea without vomiting. Denies fever, chills, body aches. Nasal congestion at baseline, no other URI symptoms. Has GI with PRN follow up. Usually takes Levsin and zofran for these symptoms, but medications expired. History of colonoscopy, due in 4 years.      Past Medical History:  Diagnosis Date  . Allergic rhinitis 07/05/2012  . Constipation    chronic  . Diabetes mellitus without complication (HCC)    No meds  . Gastroparesis   . GERD , HH, h/o PUD 07/05/2012  . Hyperlipidemia   . IBS (irritable bowel syndrome) 11/06/2012  . Obesity   . OSA (obstructive sleep apnea)    mild  . Seasonal allergies   . Sleep apnea     Patient Active Problem List   Diagnosis Date Noted  . Gastroparesis 12/18/2019  . DOE (dyspnea on exertion) 08/23/2019  . BMI 35.0-35.9,adult 09/15/2018  . OSA (obstructive sleep apnea) 10/14/2015  . Follow-up ---------------PCP NOTES 06/12/2015  . Diabetes mellitus without complication ([place]) 80/12/4915  . Fatigue 11/08/2014  . Annual physical exam 11/06/2012  . IBS (irritable bowel syndrome) 11/06/2012  . GERD , HH, h/o PUD 07/05/2012  . Allergic rhinitis 07/05/2012  . Hyperlipemia 07/05/2012    Past Surgical History:  Procedure Laterality Date  . COLONOSCOPY    . UPPER GASTROINTESTINAL ENDOSCOPY         Home Medications     Prior to Admission medications   Medication Sig Start Date End Date Taking? Authorizing Provider  acetaminophen (TYLENOL) 500 MG tablet Take 1,000 mg by mouth every 6 (six) hours as needed for moderate pain.    [provider]  Alum Hydroxide-Mag Trisilicate (GAVISCON) 91-50.5 MG CHEW Chew 1 capsule by mouth daily as needed. 07/18/19   Armbruster, Carlota Raspberry, MD  atorvastatin (LIPITOR) 20 MG tablet Take 1 tablet (20 mg total) by mouth at bedtime. 02/15/20   Colon Branch, MD  azelastine (ASTELIN) 0.1 % nasal spray Place 2 sprays into both nostrils at bedtime as needed for rhinitis or allergies. Use in each nostril as directed 10/26/18   Colon Branch, MD  beclomethasone (QVAR REDIHALER) 80 MCG/ACT inhaler Inhale 2 puffs into the lungs daily. 11/05/19   Colon Branch, MD  cetirizine (ZYRTEC) 10 MG tablet Take 10 mg by mouth daily.    [provider]  fluticasone (FLONASE) 50 MCG/ACT nasal spray Place 2 sprays into both nostrils daily. 03/16/18   [name], April, MD  Hyoscyamine Sulfate SL (LEVSIN/SL) 0.125 MG SUBL Place 0.125 mg under the tongue every 4 (four) hours as needed (abd pain). 03/14/20   Ok Edwards, PA-C  metFORMIN (GLUCOPHAGE) 850 MG tablet Take 1 tablet (850 mg total) by mouth 2 (two) times daily with a meal. 11/05/19   Colon Branch, MD  metoCLOPramide (REGLAN) 5 MG tablet Take 1 tablet (5 mg total)  by mouth 3 (three) times daily as needed for nausea. 12/17/19   Armbruster, Carlota Raspberry, MD  omeprazole (PRILOSEC) 40 MG capsule Take 1 capsule (40 mg total) by mouth 2 (two) times daily. 11/08/19   Armbruster, Carlota Raspberry, MD  ondansetron (ZOFRAN ODT) 4 MG disintegrating tablet Take 1 tablet (4 mg total) by mouth every 6 (six) hours as needed for nausea or vomiting. 03/14/20   [name], Yamile Roedl V, PA-C  polyethylene glycol (MIRALAX) 17 g packet Take 17 g by mouth daily. 07/18/19   Armbruster, Carlota Raspberry, MD  famotidine (PEPCID) 20 MG tablet Take 1 tablet (20 mg total) by mouth 2 (two) times daily. 11/06/18 12/19/19   Colon Branch, MD    Family History Family History  Problem Relation Age of Onset  . Pancreatitis Mother   . Diabetes Mother   . Hyperlipidemia Mother   . Colon polyps Mother        late 42s  . Mitral valve prolapse Mother   . Colon cancer Maternal Uncle        uncle in his 58s  . Epilepsy Father   . Colon cancer Maternal Grandfather   . CAD Other        GF in his 79s  . Irritable bowel syndrome Other   . Colitis Other   . Heart disease Other   . Prostate cancer Neg Hx     Social History Social History   Tobacco Use  . Smoking status: Current Every Day Smoker    Packs/day: 0.50    Types: Cigarettes    Start date: 2007    Last attempt to quit: 10/04/2008    Years since quitting: 11.4  . Smokeless tobacco: Never Used  Vaping Use  . Vaping Use: Never used  Substance Use Topics  . Alcohol use: No    Alcohol/week: 0.0 standard drinks    Comment: rare  . Drug use: No     Allergies   Patient has no known allergies.   Review of Systems Review of Systems  Reason unable to perform ROS: See HPI as above.     Physical Exam Triage Vital Signs ED Triage Vitals  Enc Vitals Group     BP 03/14/20 0822 115/81     Pulse Rate 03/14/20 0822 81     Resp 03/14/20 0822 18     Temp 03/14/20 0822 98.5 F (36.9 C)     Temp Source 03/14/20 0822 Oral     SpO2 03/14/20 0822 95 %     Weight --      Height --      Head Circumference --      Peak Flow --      Pain Score 03/14/20 0827 4     Pain Loc --      Pain Edu? --      Excl. in [place]? --    No data found.  Updated Vital Signs BP 115/81 (BP Location: Left Arm)   Pulse 81   Temp 98.5 F (36.9 C) (Oral)   Resp 18   SpO2 95%   Physical Exam Constitutional:      General: He is not in acute distress.    Appearance: Normal appearance. He is not ill-appearing, toxic-appearing or diaphoretic.  HENT:     Head: Normocephalic and atraumatic.  Cardiovascular:     Rate and Rhythm: Normal rate and regular rhythm.   Pulmonary:     Effort: Pulmonary effort is normal. No respiratory distress.  Comments: LCTAB Abdominal:     General: Bowel sounds are normal.     Palpations: Abdomen is soft.     Tenderness: There is no abdominal tenderness. There is no right CVA tenderness, left CVA tenderness, guarding or rebound.  Musculoskeletal:     Cervical back: Normal range of motion and neck supple.  Skin:    General: Skin is warm and dry.  Neurological:     Mental Status: He is alert and oriented to person, place, and time.      UC Treatments / Results  Labs (all labs ordered are listed, but only abnormal results are displayed) Labs Reviewed - No data to display  EKG   Radiology No results found.  Procedures Procedures (including critical care time)  Medications Ordered in UC Medications - No data to display  Initial Impression / Assessment and Plan / UC Course  I have reviewed the triage vital signs and the nursing notes.  Pertinent labs & imaging results that were available during my care of the patient were reviewed by me and considered in my medical decision making (see chart for details).    No alarming signs on exam. Will provide zofran, levsin as needed. Push fluids. Return precautions given. Otherwise, to follow up with PCP/GI for further maintenance needed.  Final Clinical Impressions(s) / UC Diagnoses   Final diagnoses:  Diarrhea, unspecified type  Nausea without vomiting    ED Prescriptions    Medication Sig Dispense Auth. Provider   Hyoscyamine Sulfate SL (LEVSIN/SL) 0.125 MG SUBL Place 0.125 mg under the tongue every 4 (four) hours as needed (abd pain). 20 tablet Andros Channing V, PA-C   ondansetron (ZOFRAN ODT) 4 MG disintegrating tablet Take 1 tablet (4 mg total) by mouth every 6 (six) hours as needed for nausea or vomiting. 20 tablet Ok Edwards, PA-C     PDMP not reviewed this encounter.   Ok Edwards, PA-C 03/14/20 [phone]

## 2020-04-15 ENCOUNTER — Encounter: Payer: Self-pay | Admitting: Internal Medicine

## 2020-04-18 ENCOUNTER — Encounter: Payer: Self-pay | Admitting: Internal Medicine

## 2020-04-18 ENCOUNTER — Ambulatory Visit (INDEPENDENT_AMBULATORY_CARE_PROVIDER_SITE_OTHER): Payer: 59 | Admitting: Internal Medicine

## 2020-04-18 ENCOUNTER — Other Ambulatory Visit: Payer: Self-pay

## 2020-04-18 VITALS — BP 118/84 | HR 86 | Temp 98.2°F | Resp 18 | Ht 72.0 in | Wt 256.5 lb

## 2020-04-18 DIAGNOSIS — E1169 Type 2 diabetes mellitus with other specified complication: Secondary | ICD-10-CM

## 2020-04-18 DIAGNOSIS — E785 Hyperlipidemia, unspecified: Secondary | ICD-10-CM

## 2020-04-18 DIAGNOSIS — E119 Type 2 diabetes mellitus without complications: Secondary | ICD-10-CM

## 2020-04-18 NOTE — Patient Instructions (Addendum)
Per our records you are due for an eye exam. Please contact your eye doctor to schedule an appointment. Please have them send copies of your office visit notes to [place]. Our fax number is [phone].   Take your flu shot this fall   GO TO THE LAB : Get the blood work     [place], [place] back for a physical exam by 12-2020

## 2020-04-18 NOTE — Progress Notes (Signed)
Subjective:    Patient ID: Wayne Melton, male    DOB: 1971-08-19, 49 y.o.   MRN: [id number]  DOS:  04/18/2020 Type of visit - description: Routine checkup Since the last office visit, no major concerns. Did have an episode of diarrhea and was seen at the urgent care.  Symptoms are for now resolved.  Review of Systems Currently with no fever chills No nausea or vomiting. No blood in the stools  Past Medical History:  Diagnosis Date  . Allergic rhinitis 07/05/2012  . Constipation    chronic  . Diabetes mellitus without complication (HCC)    No meds  . Gastroparesis   . GERD , HH, h/o PUD 07/05/2012  . Hyperlipidemia   . IBS (irritable bowel syndrome) 11/06/2012  . Obesity   . OSA (obstructive sleep apnea)    mild  . Seasonal allergies   . Sleep apnea     Past Surgical History:  Procedure Laterality Date  . COLONOSCOPY    . UPPER GASTROINTESTINAL ENDOSCOPY      Allergies as of 04/18/2020   No Known Allergies     Medication List       Accurate as of April 18, 2020 11:59 PM. If you have any questions, ask your nurse or doctor.        STOP taking these medications   famotidine 20 MG tablet Commonly known as: Pepcid Stopped by: Kathlene November, MD     TAKE these medications   acetaminophen 500 MG tablet Commonly known as: TYLENOL Take 1,000 mg by mouth every 6 (six) hours as needed for moderate pain.   atorvastatin 20 MG tablet Commonly known as: LIPITOR Take 1 tablet (20 mg total) by mouth at bedtime.   azelastine 0.1 % nasal spray Commonly known as: ASTELIN Place 2 sprays into both nostrils at bedtime as needed for rhinitis or allergies. Use in each nostril as directed   cetirizine 10 MG tablet Commonly known as: ZYRTEC Take 10 mg by mouth daily.   fluticasone 50 MCG/ACT nasal spray Commonly known as: FLONASE Place 2 sprays into both nostrils daily.   Gaviscon 80-14.2 MG Chew Generic drug: Alum Hydroxide-Mag Trisilicate Chew 1 capsule by mouth daily  as needed.   Hyoscyamine Sulfate SL 0.125 MG Subl Commonly known as: Levsin/SL Place 0.125 mg under the tongue every 4 (four) hours as needed (abd pain).   metFORMIN 850 MG tablet Commonly known as: Glucophage Take 1 tablet (850 mg total) by mouth 2 (two) times daily with a meal.   metoCLOPramide 5 MG tablet Commonly known as: REGLAN Take 1 tablet (5 mg total) by mouth 3 (three) times daily as needed for nausea.   omeprazole 40 MG capsule Commonly known as: PRILOSEC Take 1 capsule (40 mg total) by mouth 2 (two) times daily.   ondansetron 4 MG disintegrating tablet Commonly known as: Zofran ODT Take 1 tablet (4 mg total) by mouth every 6 (six) hours as needed for nausea or vomiting.   polyethylene glycol 17 g packet Commonly known as: MiraLax Take 17 g by mouth daily.   Qvar RediHaler 80 MCG/ACT inhaler Generic drug: beclomethasone Inhale 2 puffs into the lungs daily.          Objective:   Physical Exam BP 118/84 (BP Location: Left Arm, Patient Position: Sitting, Cuff Size: Normal)   Pulse 86   Temp 98.2 F (36.8 C) (Oral)   Resp 18   Ht 6' (1.829 m)   Wt 256 lb 8 oz (  116.3 kg)   SpO2 97%   BMI 34.79 kg/m  General:   Well developed, NAD, BMI noted. HEENT:  Normocephalic . Face symmetric, atraumatic Lungs:  CTA B Normal respiratory effort, no intercostal retractions, no accessory muscle use. Heart: RRR,  no murmur.  Lower extremities: no pretibial edema bilaterally  Skin: Not pale. Not jaundice Neurologic:  alert & oriented X3.  Speech normal, gait appropriate for age and unassisted Psych--  Cognition and judgment appear intact.  Cooperative with normal attention span and concentration.  Behavior appropriate. No anxious or depressed appearing.      Assessment    Assessment DM Hyperlipidemia Bronchospasm (on Qvar) GI: --History of PUD (EGD @ [facility]  2002: neg  but reports had another EGD with ulcer) --GERD, IBS, occ constipation, gastroparesis (+  gastric emptying study October 2020) -- fatty liver per u/s 06-2015 -EGD 08-2017: Gastritis, polyps, benign findings per GI letter, no follow-up OSA:  (-)  home sleep apnea test 2014,  Full sleep study~08-2015 : mild OSA, saw pulmonary, rx dental appliance, wt loss HST 08-2018: Rx CPAP - DOE x years>>>>  neg stress test 03/2015  PLAN DM: Currently on Metformin, will RF.  Check a A1c and BMP.  Watch diet. Hyperlipidemia: Based on last FLP, atorvastatin prescribed, significant improvement, LFTs remain normal, recommend to continue atorvastatin. IBS: Has episodes of diarrhea.  Currently asymptomatic Asthma is well controlled. Recommend to get a flu shot this fall RTC CPX 12/2020.  RTC sooner if needed   This visit occurred during the SARS-CoV-2 public health emergency.  Safety protocols were in place, including screening questions prior to the visit, additional usage of staff PPE, and extensive cleaning of exam room while observing appropriate contact time as indicated for disinfecting solutions.

## 2020-04-18 NOTE — Progress Notes (Signed)
Pre visit review using our clinic review tool, if applicable. No additional management support is needed unless otherwise documented below in the visit note. 

## 2020-04-19 DIAGNOSIS — E1169 Type 2 diabetes mellitus with other specified complication: Secondary | ICD-10-CM | POA: Insufficient documentation

## 2020-04-19 LAB — BASIC METABOLIC PANEL
BUN: 12 mg/dL (ref 7–25)
CO2: 26 mmol/L (ref 20–32)
Calcium: 10.3 mg/dL (ref 8.6–10.3)
Chloride: 102 mmol/L (ref 98–110)
Creat: 1.05 mg/dL (ref 0.60–1.35)
Glucose, Bld: 93 mg/dL (ref 65–99)
Potassium: 4.4 mmol/L (ref 3.5–5.3)
Sodium: 140 mmol/L (ref 135–146)

## 2020-04-19 LAB — HEMOGLOBIN A1C
Hgb A1c MFr Bld: 6.2 % of total Hgb — ABNORMAL HIGH (ref <5.7)
Mean Plasma Glucose: 131 (calc)
eAG (mmol/L): 7.3 (calc)

## 2020-04-19 NOTE — Assessment & Plan Note (Signed)
DM: Currently on Metformin, will RF.  Check a A1c and BMP.  Watch diet. Hyperlipidemia: Based on last FLP, atorvastatin prescribed, significant improvement, LFTs remain normal, recommend to continue atorvastatin. IBS: Has episodes of diarrhea.  Currently asymptomatic Asthma is well controlled. Recommend to get a flu shot this fall RTC CPX 12/2020.  RTC sooner if needed

## 2020-04-23 ENCOUNTER — Other Ambulatory Visit: Payer: Self-pay | Admitting: Internal Medicine

## 2020-04-25 ENCOUNTER — Other Ambulatory Visit: Payer: Self-pay | Admitting: Gastroenterology

## 2020-05-08 ENCOUNTER — Telehealth: Payer: Self-pay | Admitting: Pulmonary Disease

## 2020-05-08 NOTE — Telephone Encounter (Signed)
Patient called, risk and benefits of using recalled machine reviewed. Patient has mild sleep apnea with a drop in oxygen saturations. Patient has decided to monitor his machine and continue using. Patient advised to make a follow up appointment since he has not been since 2019.

## 2020-05-10 ENCOUNTER — Encounter (HOSPITAL_BASED_OUTPATIENT_CLINIC_OR_DEPARTMENT_OTHER): Payer: Self-pay | Admitting: Emergency Medicine

## 2020-05-10 ENCOUNTER — Other Ambulatory Visit: Payer: Self-pay

## 2020-05-10 ENCOUNTER — Emergency Department (HOSPITAL_BASED_OUTPATIENT_CLINIC_OR_DEPARTMENT_OTHER): Payer: 59

## 2020-05-10 ENCOUNTER — Emergency Department (HOSPITAL_BASED_OUTPATIENT_CLINIC_OR_DEPARTMENT_OTHER)
Admission: EM | Admit: 2020-05-10 | Discharge: 2020-05-10 | Disposition: A | Discharge status: Home / Self Care | Payer: 59 | Attending: Emergency Medicine | Admitting: Emergency Medicine

## 2020-05-10 DIAGNOSIS — E119 Type 2 diabetes mellitus without complications: Secondary | ICD-10-CM | POA: Diagnosis not present

## 2020-05-10 DIAGNOSIS — F17210 Nicotine dependence, cigarettes, uncomplicated: Secondary | ICD-10-CM | POA: Insufficient documentation

## 2020-05-10 DIAGNOSIS — I671 Cerebral aneurysm, nonruptured: Secondary | ICD-10-CM | POA: Diagnosis not present

## 2020-05-10 DIAGNOSIS — R519 Headache, unspecified: Secondary | ICD-10-CM | POA: Diagnosis not present

## 2020-05-10 LAB — BASIC METABOLIC PANEL
Anion gap: 8 (ref 5–15)
BUN: 9 mg/dL (ref 6–20)
CO2: 29 mmol/L (ref 22–32)
Calcium: 9.4 mg/dL (ref 8.9–10.3)
Chloride: 101 mmol/L (ref 98–111)
Creatinine, Ser: 1.02 mg/dL (ref 0.61–1.24)
GFR calc Af Amer: >60 mL/min (ref >60)
GFR calc non Af Amer: >60 mL/min (ref >60)
Glucose, Bld: 145 mg/dL — ABNORMAL HIGH (ref 70–99)
Potassium: 4.4 mmol/L (ref 3.5–5.1)
Sodium: 138 mmol/L (ref 135–145)

## 2020-05-10 LAB — CSF CELL COUNT WITH DIFFERENTIAL
RBC Count, CSF: 2 /mm3 — ABNORMAL HIGH
RBC Count, CSF: 250 /mm3 — ABNORMAL HIGH
Tube #: 1
Tube #: 4
WBC, CSF: 0 /mm3 (ref 0–5)
WBC, CSF: 1 /mm3 (ref 0–5)

## 2020-05-10 LAB — CBC WITH DIFFERENTIAL/PLATELET
Abs Immature Granulocytes: 0.04 10*3/uL (ref 0.00–0.07)
Basophils Absolute: 0.1 10*3/uL (ref 0.0–0.1)
Basophils Relative: 1 %
Eosinophils Absolute: 1.6 10*3/uL — ABNORMAL HIGH (ref 0.0–0.5)
Eosinophils Relative: 14 %
HCT: 43.8 % (ref 39.0–52.0)
Hemoglobin: 14.7 g/dL (ref 13.0–17.0)
Immature Granulocytes: 0 %
Lymphocytes Relative: 20 %
Lymphs Abs: 2.3 10*3/uL (ref 0.7–4.0)
MCH: 30.1 pg (ref 26.0–34.0)
MCHC: 33.6 g/dL (ref 30.0–36.0)
MCV: 89.8 fL (ref 80.0–100.0)
Monocytes Absolute: 0.7 10*3/uL (ref 0.1–1.0)
Monocytes Relative: 6 %
Neutro Abs: 6.9 10*3/uL (ref 1.7–7.7)
Neutrophils Relative %: 59 %
Platelets: 284 10*3/uL (ref 150–400)
RBC: 4.88 MIL/uL (ref 4.22–5.81)
RDW: 12.7 % (ref 11.5–15.5)
WBC: 11.5 10*3/uL — ABNORMAL HIGH (ref 4.0–10.5)
nRBC: 0 % (ref 0.0–0.2)

## 2020-05-10 MED ORDER — METOCLOPRAMIDE HCL 10 MG PO TABS
10.0000 mg | ORAL_TABLET | Freq: Four times a day (QID) | ORAL | 0 refills | Status: DC | PRN
Start: 2020-05-10 — End: 2020-09-23

## 2020-05-10 MED ORDER — SODIUM CHLORIDE 0.9 % IV BOLUS
1000.0000 mL | Freq: Once | INTRAVENOUS | Status: AC
  Administered 2020-05-10: 1000 mL via INTRAVENOUS

## 2020-05-10 MED ORDER — IOHEXOL 350 MG/ML SOLN
100.0000 mL | Freq: Once | INTRAVENOUS | Status: AC | PRN
  Administered 2020-05-10: 100 mL via INTRAVENOUS

## 2020-05-10 NOTE — ED Provider Notes (Signed)
  Physical Exam  BP 102/70   Pulse 68   Temp 98.1 F (36.7 C) (Oral)   Resp 18   Ht 6' (1.829 m)   Wt 115.7 kg   SpO2 96%   BMI 34.58 kg/m   Physical Exam  ED Course/Procedures     Procedures  MDM  Care assumed at 3:30 PM.  Patient had acute onset of headache and had a CTA that showed small aneurysm.  Neurosurgery was consulted and recommend LP and that was performed by previous provider.  Signout to follow-up LP results.  5:48 PM Patient has no fever or neck stiffness.  LP results showed 250 RBC in tube 1 and 2 RBC in tube 4.  There is too few to count white blood cell counts in the LP fluid.  Patient remains headache free.  I think patient likely had migraine that is not related to the aneurysm. However, he needs follow up with neurosurgery outpatient to monitor the aneurysm. Gave strict return precautions       Drenda Freeze, MD 05/10/20 [phone]

## 2020-05-10 NOTE — Discharge Instructions (Signed)
Take Tylenol for headache.  Take Reglan for headache or nausea.  You have a small aneurysm that needs to fully followed up with neurosurgeon.  Call neurosurgery for follow-up.  Return to ER if you have worse headache, passing out, vomiting, weakness

## 2020-05-10 NOTE — ED Provider Notes (Signed)
Emergency Department Provider Note   I have reviewed the triage vital signs and the nursing notes.   HISTORY  Chief Complaint Headache   HPI [name] is a 49 y.o. male with past medical history reviewed below presents to the  emergency department for evaluation of acute onset sharp, right-sided headache during exercise today.  Patient is not currently experiencing headache symptoms.  He tells me that he gets headaches from time to time but this 1 felt different.  It was acute onset and sharp.  The headache resolved after he stopped exercising after several minutes.  He did not have any vision change, nausea, vomiting, weakness/numbness.  Denies any fevers or chills.  No known history of aneurysm.   Past Medical History:  Diagnosis Date  . Allergic rhinitis 07/05/2012  . Constipation    chronic  . Diabetes mellitus without complication (HCC)    No meds  . Gastroparesis   . GERD , HH, h/o PUD 07/05/2012  . Hyperlipidemia   . IBS (irritable bowel syndrome) 11/06/2012  . Obesity   . OSA (obstructive sleep apnea)    mild  . Seasonal allergies   . Sleep apnea     Patient Active Problem List   Diagnosis Date Noted  . Hyperlipidemia associated with type 2 diabetes mellitus ([place]) 04/19/2020  . Gastroparesis 12/18/2019  . DOE (dyspnea on exertion) 08/23/2019  . BMI 35.0-35.9,adult 09/15/2018  . OSA (obstructive sleep apnea) 10/14/2015  . Follow-up ---------------PCP NOTES 06/12/2015  . Diabetes mellitus without complication ([place]) 68/09/7516  . Fatigue 11/08/2014  . Annual physical exam 11/06/2012  . IBS (irritable bowel syndrome) 11/06/2012  . GERD , HH, h/o PUD 07/05/2012  . Allergic rhinitis 07/05/2012  . Hyperlipemia 07/05/2012    Past Surgical History:  Procedure Laterality Date  . COLONOSCOPY    . UPPER GASTROINTESTINAL ENDOSCOPY      Allergies Patient has no known allergies.  Family History  Problem Relation Age of Onset  . Pancreatitis Mother   .  Diabetes Mother   . Hyperlipidemia Mother   . Colon polyps Mother        late 36s  . Mitral valve prolapse Mother   . Colon cancer Maternal Uncle        uncle in his 86s  . Epilepsy Father   . Colon cancer Maternal Grandfather   . CAD Other        GF in his 15s  . Irritable bowel syndrome Other   . Colitis Other   . Heart disease Other   . Prostate cancer Neg Hx     Social History Social History   Tobacco Use  . Smoking status: Current Every Day Smoker    Packs/day: 0.50    Types: Cigarettes    Start date: 2007    Last attempt to quit: 10/04/2008    Years since quitting: 11.6  . Smokeless tobacco: Never Used  Vaping Use  . Vaping Use: Never used  Substance Use Topics  . Alcohol use: No    Alcohol/week: 0.0 standard drinks    Comment: rare  . Drug use: No    Review of Systems  Constitutional: No fever/chills Eyes: No visual changes. ENT: No sore throat. Cardiovascular: Denies chest pain. Respiratory: Denies shortness of breath. Gastrointestinal: No abdominal pain.  No nausea, no vomiting.  No diarrhea.  No constipation. Genitourinary: Negative for dysuria. Musculoskeletal: Negative for back pain. Skin: Negative for rash. Neurological: Negative for focal weakness or numbness. Positive sudden severe  HA.   10-point ROS otherwise negative.  ____________________________________________   PHYSICAL EXAM:  VITAL SIGNS: ED Triage Vitals  Enc Vitals Group     BP 05/10/20 1119 (!) 142/87     Pulse Rate 05/10/20 1119 78     Resp 05/10/20 1119 18     Temp 05/10/20 1119 98.1 F (36.7 C)     Temp Source 05/10/20 1119 Oral     SpO2 05/10/20 1119 98 %     Weight 05/10/20 1117 255 lb (115.7 kg)     Height 05/10/20 1117 6' (1.829 m)   Constitutional: Alert and oriented. Well appearing and in no acute distress. Eyes: Conjunctivae are normal. PERRL. EOMI.  Head: Atraumatic. Nose: No congestion/rhinnorhea. Mouth/Throat: Mucous membranes are moist.  Neck: No stridor.    Cardiovascular: Normal rate, regular rhythm. Good peripheral circulation. Grossly normal heart sounds.   Respiratory: Normal respiratory effort.  No retractions. Lungs CTAB. Gastrointestinal: Soft and nontender. No distention.  Musculoskeletal: No lower extremity tenderness nor edema. No gross deformities of extremities. Neurologic:  Normal speech and language.  No facial asymmetry.  No weakness/numbness in the bilateral arms or legs.  Skin:  Skin is warm, dry and intact. No rash noted.   ____________________________________________   LABS (all labs ordered are listed, but only abnormal results are displayed)  Labs Reviewed  BASIC METABOLIC PANEL - Abnormal; Notable for the following components:      Result Value   Glucose, Bld 145 (*)    All other components within normal limits  CBC WITH DIFFERENTIAL/PLATELET - Abnormal; Notable for the following components:   WBC 11.5 (*)    Eosinophils Absolute 1.6 (*)    All other components within normal limits  CSF CELL COUNT WITH DIFFERENTIAL - Abnormal; Notable for the following components:   RBC Count, CSF 250 (*)    All other components within normal limits  CSF CELL COUNT WITH DIFFERENTIAL - Abnormal; Notable for the following components:   RBC Count, CSF 2 (*)    All other components within normal limits   ____________________________________________  RADIOLOGY  CT Angio Head W or Wo Contrast  Result Date: 05/10/2020 CLINICAL DATA:  Sudden onset severe headache during exercise. Additional provided: Patient reports headache while exercising. EXAM: CT ANGIOGRAPHY HEAD TECHNIQUE: Multidetector CT imaging of the head was performed using the standard protocol during bolus administration of intravenous contrast. Multiplanar CT image reconstructions and MIPs were obtained to evaluate the vascular anatomy. CONTRAST:  143mL OMNIPAQUE IOHEXOL 350 MG/ML SOLN COMPARISON:  No pertinent prior exams are available for comparison. FINDINGS: CT HEAD  Brain: Cerebral volume is normal. There is no acute intracranial hemorrhage. No demarcated cortical infarct. No extra-axial fluid collection. No evidence of intracranial mass. No midline shift. Vascular: Reported below. Skull: Normal. Negative for fracture or focal lesion. Sinuses: No significant paranasal sinus disease or mastoid effusion. Orbits: No acute abnormality. CTA HEAD Anterior circulation: The intracranial internal carotid arteries are patent. The M1 middle cerebral arteries are patent without significant stenosis. No M2 proximal branch occlusion or high-grade proximal stenosis is identified. The anterior cerebral arteries are patent. Bulbous appearance of the left MCA bifurcation measuring 2-3 mm, giving rise to the superior and inferior division proximal M2 left MCA branch vessels (series 12, image 14) (series 15, image 117). This could reflect anatomic variation or a small aneurysm. No intracranial aneurysm is identified elsewhere. Posterior circulation: Developmentally diminutive posterior circulation in the setting of bilateral fetal origin PCAs. The intracranial vertebral arteries are patent. The  basilar artery is patent. The posterior cerebral arteries are patent. Venous sinuses: Within limitations of contrast timing, no convincing thrombus. Anatomic variants: As described IMPRESSION: CT head: Unremarkable non-contrast CT appearance of the brain. No evidence of acute intracranial abnormality. CTA head: 1. Bulbous appearance of the of the left MCA bifurcation measuring 2-3 mm, and giving rise to the superior and inferior division proximal M2 left MCA branches. This could reflect anatomic variation or a small aneurysm. 2. No intracranial large vessel occlusion or proximal high-grade arterial stenosis. Electronically Signed   By: Kellie Simmering DO   On: 05/10/2020 12:57    ____________________________________________   PROCEDURES  Procedure(s) performed:   .Lumbar Puncture  Date/Time:  05/10/2020 2:26 PM Performed by: Margette Fast, MD Authorized by: Margette Fast, MD   Consent:    Consent obtained:  Verbal   Consent given by:  Patient   Risks discussed:  Bleeding, headache, nerve damage, infection, pain and repeat procedure Pre-procedure details:    Procedure purpose:  Diagnostic   Preparation: Patient was prepped and draped in usual sterile fashion   Anesthesia (see MAR for exact dosages):    Anesthesia method:  Local infiltration   Local anesthetic:  Lidocaine 1% w/o epi Procedure details:    Lumbar space:  L3-L4 interspace   Needle gauge:  20   Needle type:  Diamond point   Needle length (in):  3.5   Ultrasound guidance: no     Number of attempts:  2   Fluid appearance:  Clear   Tubes of fluid:  4   Total volume (ml):  6 Post-procedure:    Puncture site:  Adhesive bandage applied and direct pressure applied   Patient tolerance of procedure:  Tolerated well, no immediate complications     ____________________________________________   INITIAL IMPRESSION / ASSESSMENT AND PLAN / ED COURSE  Pertinent labs & imaging results that were available during my care of the patient were reviewed by me and considered in my medical decision making (see chart for details).   Patient presents emergency department for evaluation after experiencing an acute onset, severe headache while exercising today.  No longer having headache.  He does arrive to the ED within 1 hour of headache onset.  Normal neurologic exam.  CTA of the head obtained given history to evaluate for acute subarachnoid hemorrhage.  This was not found on CT but there is an area of enlargement at the left MCA bifurcation measuring 2 to 3 mm.  Discussed this finding with the neurosurgeon on-call, Dr. Reatha Armour.  Given history, advises follow-up lumbar puncture to assess for xanthochromia.  Patient to follow with their office if negative for further imaging as needed.   CSF pending. Care transferred to Dr. Darl Householder.    ____________________________________________  FINAL CLINICAL IMPRESSION(S) / ED DIAGNOSES  Final diagnoses:  Nonintractable headache, unspecified chronicity pattern, unspecified headache type  Cerebral aneurysm     MEDICATIONS GIVEN DURING THIS VISIT:  Medications  iohexol (OMNIPAQUE) 350 MG/ML injection 100 mL (100 mLs Intravenous Contrast Given 05/10/20 1225)  sodium chloride 0.9 % bolus 1,000 mL (0 mLs Intravenous Stopped 05/10/20 1701)     Note:  This document was prepared using Dragon voice recognition software and may include unintentional dictation errors.  Nanda Quinton, MD, [facility] Emergency Medicine    Thayer Inabinet, Wonda Olds, MD 05/11/20 [phone]

## 2020-05-10 NOTE — ED Triage Notes (Signed)
Pt arrives pov with c/o acute onset right side Headache during physical activity 1 hr pta. Pt denies dizziness, denies n/v, pain has resolved pta

## 2020-05-10 NOTE — ED Notes (Signed)
Patient transported to CT 

## 2020-05-14 DIAGNOSIS — I671 Cerebral aneurysm, nonruptured: Secondary | ICD-10-CM | POA: Insufficient documentation

## 2020-05-15 IMAGING — CT CT HEAD W/O CM
3 series · 15 of 47 positions shown, 18 images · non-contrast
Comparison: None.

CLINICAL DATA: Right ear pain for 5 days.  Hypertension.

EXAM:
CT HEAD WITHOUT CONTRAST
TECHNIQUE: Contiguous axial images were obtained from the base of the skull
through the vertex without intravenous contrast.

[Series 2: head wo · axial · 0.48mm/px · z∈[-209,-59]mm · 9 of 36 slices shown, 12 images]
[im 3/36  brain]
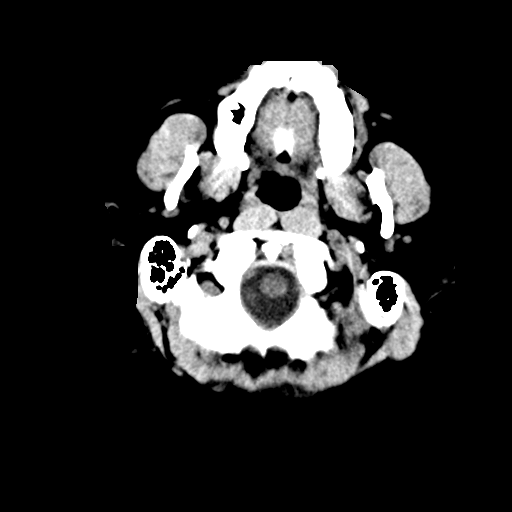
[im 3/36  bone]
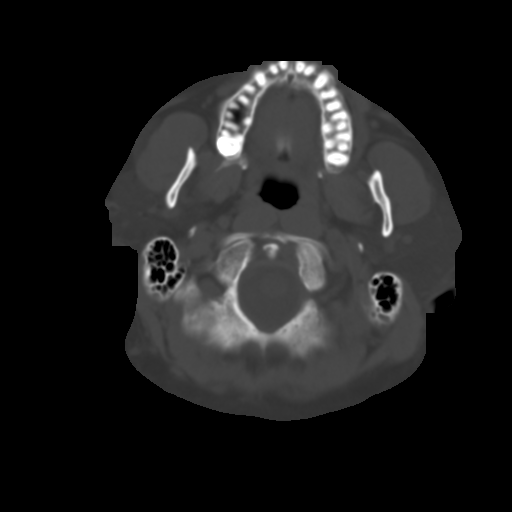
[im 7/36  brain]
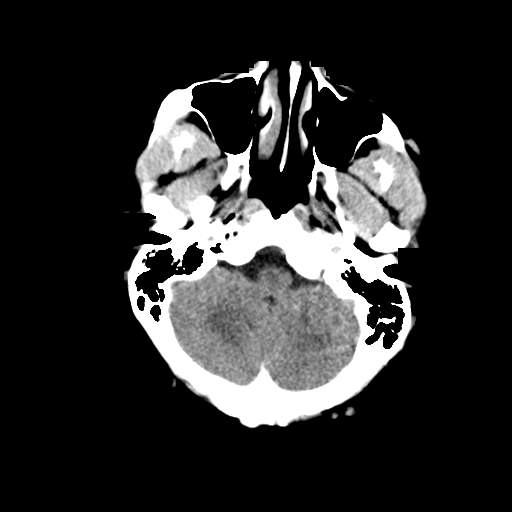
[im 10/36  brain]
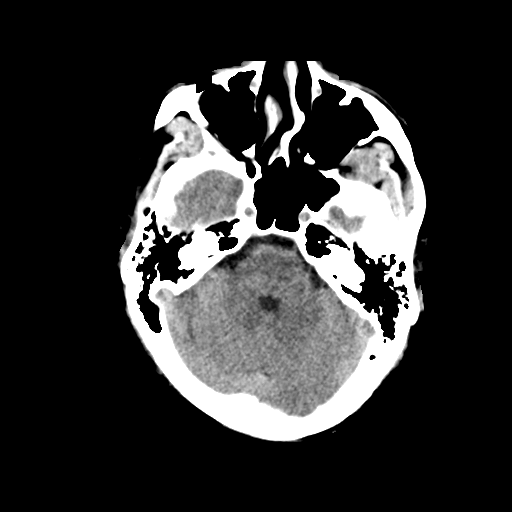
[im 14/36  brain]
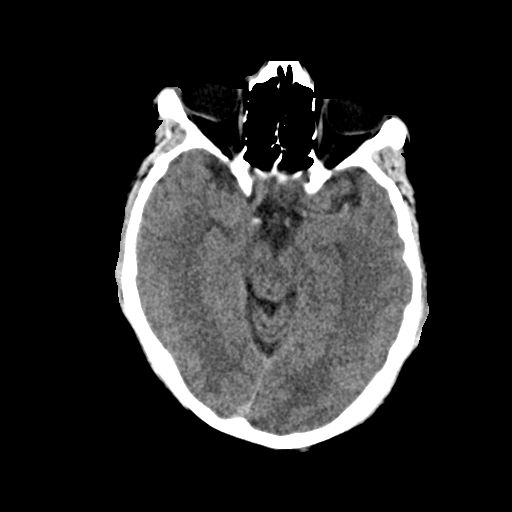
[im 19/36  brain]
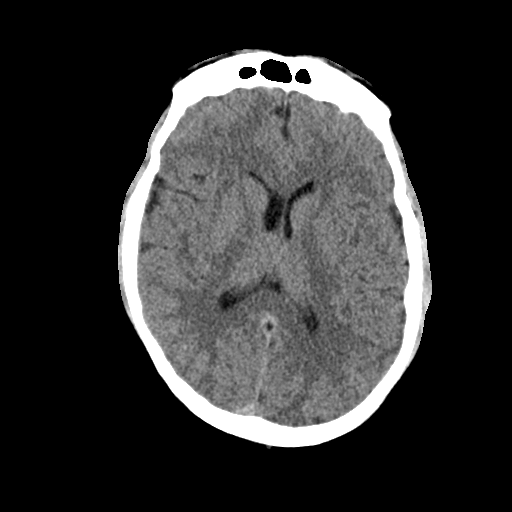
[im 19/36  bone]
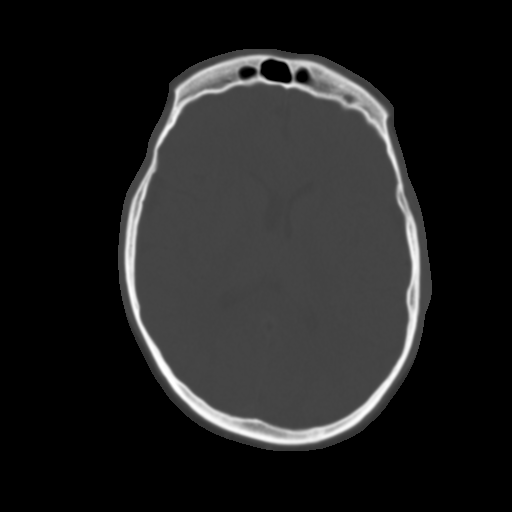
[im 22/36  brain]
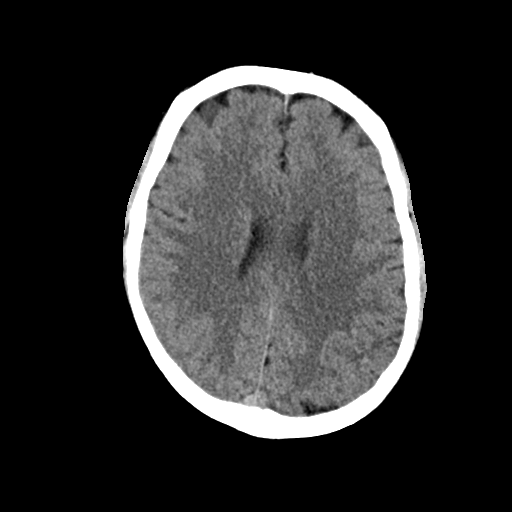
[im 26/36  brain]
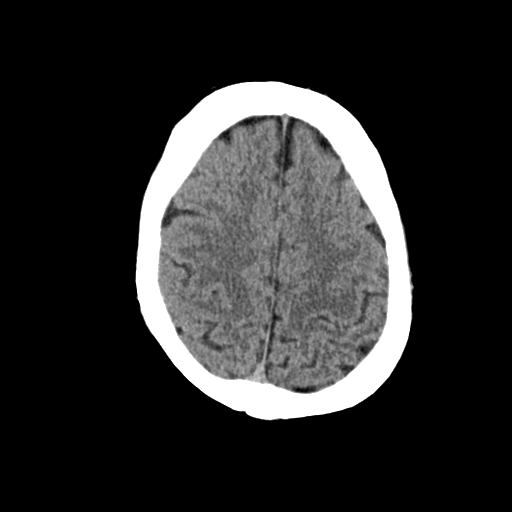
[im 29/36  brain]
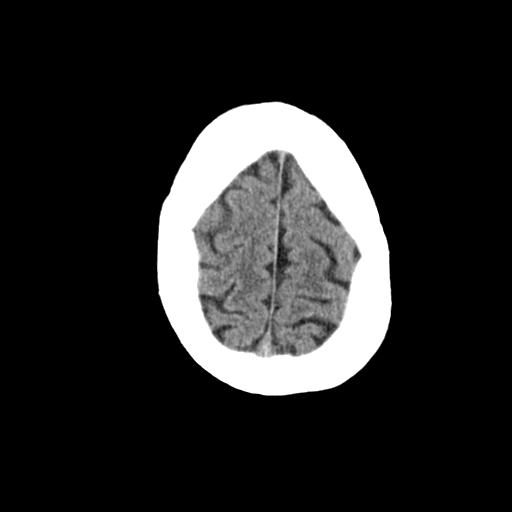
[im 33/36  brain]
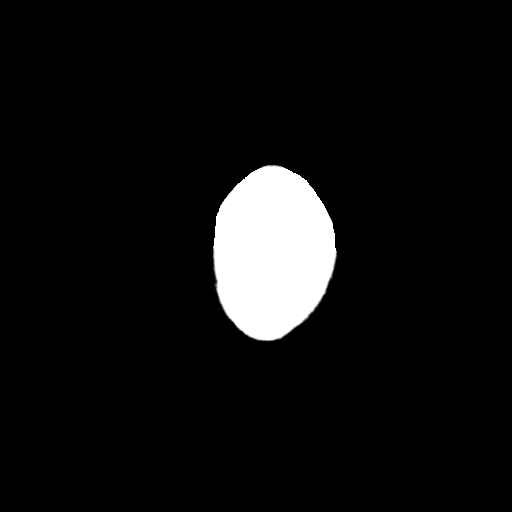
[im 33/36  bone]
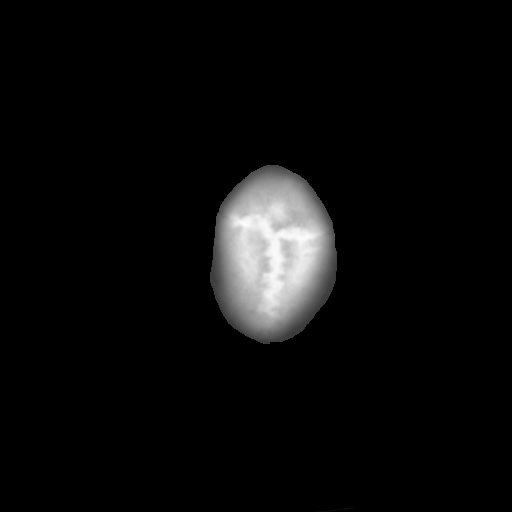

[Series 4: coronal soft · coronal · 0.35mm/px · 3 of 72 slices shown]
[im 24/72  brain]
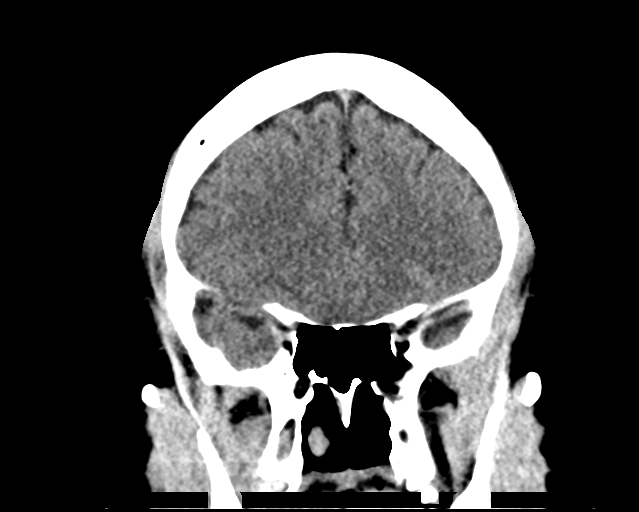
[im 32/72  brain]
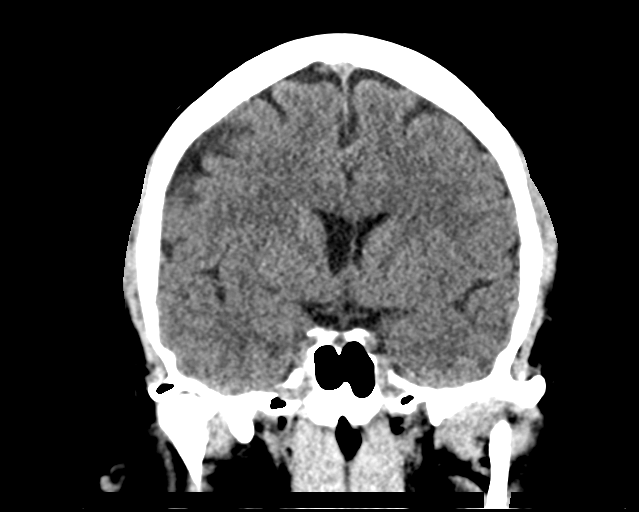
[im 40/72  brain]
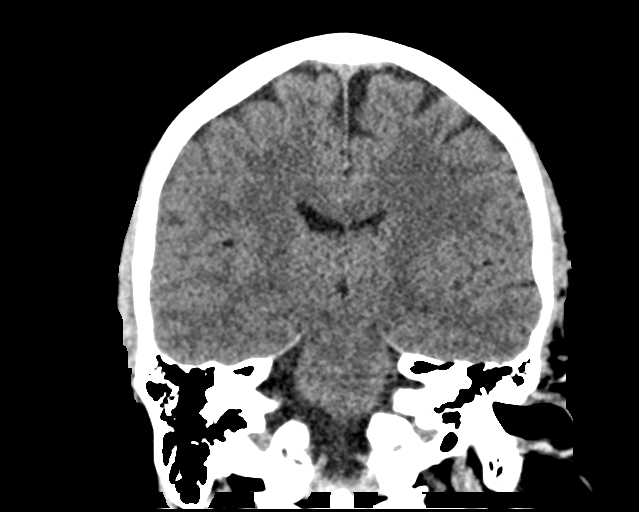

[Series 5: sag soft · sagittal · 0.38mm/px · 3 of 59 slices shown]
[im 20/59  brain]
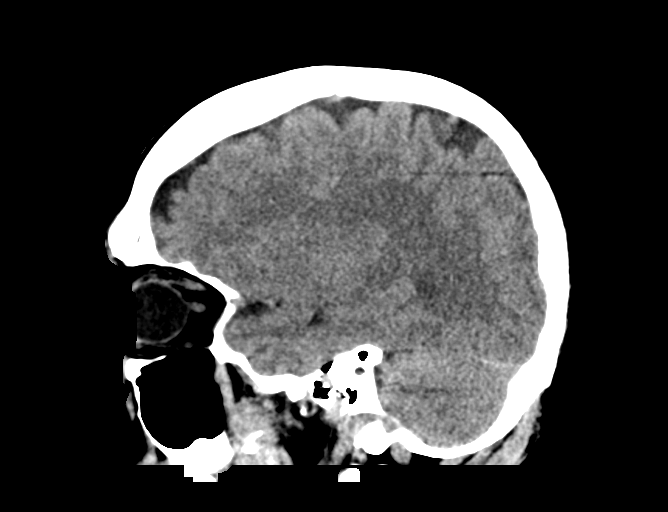
[im 30/59  brain]
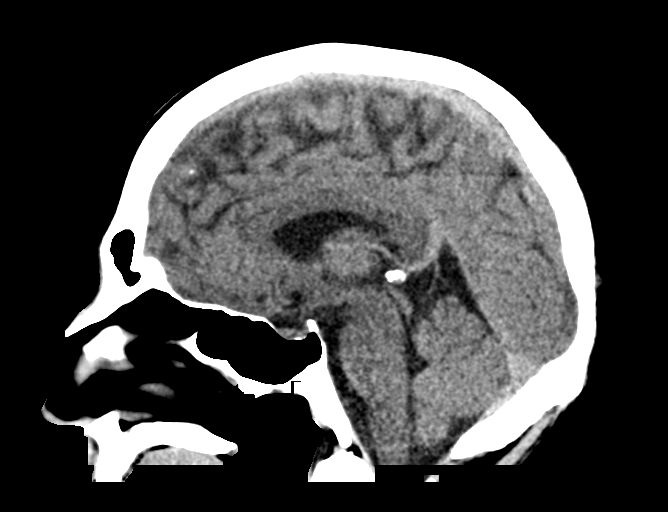
[im 39/59  brain]
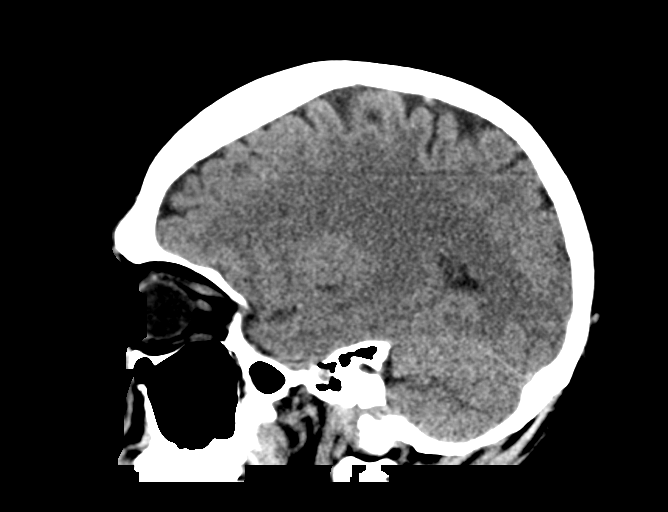

[15 of 47 positions shown; findings below may reference images not displayed]

FINDINGS: Brain: No intracranial hemorrhage, mass effect, or midline shift. No
hydrocephalus. The basilar cisterns are patent. No evidence of
territorial infarct or acute ischemia. No extra-axial or
intracranial fluid collection.

Vascular: No hyperdense vessel or unexpected calcification.

Skull: No fracture or focal lesion.

Sinuses/Orbits: Insert orbits

Other: None.
IMPRESSION: No acute intracranial abnormality.

## 2020-05-18 ENCOUNTER — Other Ambulatory Visit: Payer: Self-pay | Admitting: Gastroenterology

## 2020-07-16 ENCOUNTER — Other Ambulatory Visit: Payer: Self-pay | Admitting: Internal Medicine

## 2020-08-18 ENCOUNTER — Other Ambulatory Visit: Payer: Self-pay | Admitting: Gastroenterology

## 2020-09-04 ENCOUNTER — Other Ambulatory Visit: Payer: Self-pay

## 2020-09-04 ENCOUNTER — Ambulatory Visit
Admission: EM | Admit: 2020-09-04 | Discharge: 2020-09-04 | Disposition: A | Discharge status: Home / Self Care | Payer: 59 | Attending: Emergency Medicine | Admitting: Emergency Medicine

## 2020-09-04 DIAGNOSIS — H6122 Impacted cerumen, left ear: Secondary | ICD-10-CM | POA: Diagnosis not present

## 2020-09-04 MED ORDER — PREDNISONE 10 MG PO TABS
ORAL_TABLET | ORAL | 0 refills | Status: DC
Start: 2020-09-04 — End: 2020-09-23

## 2020-09-04 NOTE — ED Provider Notes (Signed)
Wayne Melton    CSN: [id number] Arrival date & time: 09/04/20  0810      History   Chief Complaint Chief Complaint  Patient presents with  . Otalgia    HPI [name] is a 49 y.o. male history of DM type II, GERD, hyperlipidemia, presenting today for evaluation of ear pain.  Reports that he has felt left ear pain pressure and fullness for approximately 1 week.  Does report allergies and takes daily Zyrtec and nasonex..  Denies specific pain associated with the ears.  HPI  Past Medical History:  Diagnosis Date  . Allergic rhinitis 07/05/2012  . Constipation    chronic  . Diabetes mellitus without complication (HCC)    No meds  . Gastroparesis   . GERD , HH, h/o PUD 07/05/2012  . Hyperlipidemia   . IBS (irritable bowel syndrome) 11/06/2012  . Obesity   . OSA (obstructive sleep apnea)    mild  . Seasonal allergies   . Sleep apnea     Patient Active Problem List   Diagnosis Date Noted  . Hyperlipidemia associated with type 2 diabetes mellitus ([name]) 04/19/2020  . Gastroparesis 12/18/2019  . DOE (dyspnea on exertion) 08/23/2019  . BMI 35.0-35.9,adult 09/15/2018  . OSA (obstructive sleep apnea) 10/14/2015  . Follow-up ---------------PCP NOTES 06/12/2015  . Diabetes mellitus without complication ([place] Mel[place]) 18/84/1660  . Fatigue 11/08/2014  . Annual physical exam 11/06/2012  . IBS (irritable bowel syndrome) 11/06/2012  . GERD , HH, h/o PUD 07/05/2012  . Allergic rhinitis 07/05/2012  . Hyperlipemia 07/05/2012    Past Surgical History:  Procedure Laterality Date  . COLONOSCOPY    . UPPER GASTROINTESTINAL ENDOSCOPY         Home Medications    Prior to Admission medications   Medication Sig Start Date End Date Taking? Authorizing Provider  acetaminophen (TYLENOL) 500 MG tablet Take 1,000 mg by mouth every 6 (six) hours as needed for moderate pain.    [provider]  Alum Hydroxide-Mag Trisilicate (GAVISCON) 63-01.6 MG CHEW Chew 1 capsule by  mouth daily as needed. 07/18/19   Armbruster, Carlota Raspberry, MD  atorvastatin (LIPITOR) 20 MG tablet Take 1 tablet (20 mg total) by mouth at bedtime. 02/15/20   Colon Branch, MD  azelastine (ASTELIN) 0.1 % nasal spray Place 2 sprays into both nostrils at bedtime as needed for rhinitis or allergies. Use in each nostril as directed 10/26/18   Colon Branch, MD  beclomethasone (QVAR REDIHALER) 80 MCG/ACT inhaler Inhale 2 puffs into the lungs daily. 07/16/20   Colon Branch, MD  cetirizine (ZYRTEC) 10 MG tablet Take 10 mg by mouth daily.    [provider]  fluticasone (FLONASE) 50 MCG/ACT nasal spray Place 2 sprays into both nostrils daily. 03/16/18   [name], April, MD  Hyoscyamine Sulfate SL (LEVSIN/SL) 0.125 MG SUBL Place 0.125 mg under the tongue every 4 (four) hours as needed (abd pain). 03/14/20   Ok Edwards, PA-C  metFORMIN (GLUCOPHAGE) 850 MG tablet Take 1 tablet (850 mg total) by mouth 2 (two) times daily with a meal. 04/23/20   Colon Branch, MD  metoCLOPramide (REGLAN) 10 MG tablet Take 1 tablet (10 mg total) by mouth every 6 (six) hours as needed for nausea (nausea/headache). 05/10/20   Drenda Freeze, MD  omeprazole (PRILOSEC) 40 MG capsule Take 1 capsule (40 mg total) by mouth in the morning and at bedtime. Please schedule a yearly follow up for further refills. Thank you.  08/18/20   Armbruster, Carlota Raspberry, MD  ondansetron (ZOFRAN ODT) 4 MG disintegrating tablet Take 1 tablet (4 mg total) by mouth every 6 (six) hours as needed for nausea or vomiting. 03/14/20   [name], [name], PA-C  polyethylene glycol (MIRALAX) 17 g packet Take 17 g by mouth daily. 07/18/19   Armbruster, Carlota Raspberry, MD  predniSONE (DELTASONE) 10 MG tablet Begin with 6 tabs on day 1, 5 tab on day 2, 4 tab on day 3, 3 tab on day 4, 2 tab on day 5, 1 tab on day 6-take with food 09/04/20   [name], [name], PA-C    Family History Family History  Problem Relation Age of Onset  . Pancreatitis Mother   . Diabetes Mother   . Hyperlipidemia  Mother   . Colon polyps Mother        late 4s  . Mitral valve prolapse Mother   . Colon cancer Maternal Uncle        uncle in his 98s  . Epilepsy Father   . Colon cancer Maternal Grandfather   . CAD Other        GF in his 4s  . Irritable bowel syndrome Other   . Colitis Other   . Heart disease Other   . Prostate cancer Neg Hx     Social History Social History   Tobacco Use  . Smoking status: Former Smoker    Packs/day: 0.50    Types: Cigarettes    Start date: 2007    Quit date: 10/04/2008    Years since quitting: 11.9  . Smokeless tobacco: Never Used  Vaping Use  . Vaping Use: Never used  Substance Use Topics  . Alcohol use: No    Alcohol/week: 0.0 standard drinks    Comment: rare  . Drug use: No     Allergies   Patient has no known allergies.   Review of Systems Review of Systems  Constitutional: Negative for activity change, appetite change, chills, fatigue and fever.  HENT: Positive for ear pain. Negative for congestion, rhinorrhea, sinus pressure, sore throat and trouble swallowing.   Eyes: Negative for discharge and redness.  Respiratory: Negative for cough, chest tightness and shortness of breath.   Cardiovascular: Negative for chest pain.  Gastrointestinal: Negative for abdominal pain, diarrhea, nausea and vomiting.  Musculoskeletal: Negative for myalgias.  Skin: Negative for rash.  Neurological: Negative for dizziness, light-headedness and headaches.     Physical Exam Triage Vital Signs ED Triage Vitals  Enc Vitals Group     BP 09/04/20 0817 114/77     Pulse Rate 09/04/20 0817 88     Resp 09/04/20 0817 16     Temp 09/04/20 0817 98.7 F (37.1 C)     Temp Source 09/04/20 0817 Oral     SpO2 09/04/20 0817 96 %     Weight --      Height --      Head Circumference --      Peak Flow --      Pain Score 09/04/20 0821 0     Pain Loc --      Pain Edu? --      Excl. in [place]? --    No data found.  Updated Vital Signs BP 114/77 (BP Location: Left  Arm)   Pulse 88   Temp 98.7 F (37.1 C) (Oral)   Resp 16   SpO2 96%   Visual Acuity Right Eye Distance:   Left Eye Distance:  Bilateral Distance:    Right Eye Near:   Left Eye Near:    Bilateral Near:     Physical Exam Vitals and nursing note reviewed.  Constitutional:      Appearance: He is well-developed.     Comments: No acute distress  HENT:     Head: Normocephalic and atraumatic.     Ears:     Comments: Bilateral cerumen impaction, after removal TMs intact, good bony landmarks and cone of light, minimal effusions noted, no erythema    Nose: Nose normal.     Mouth/Throat:     Comments: Oral mucosa pink and moist, no tonsillar enlargement or exudate. Posterior pharynx patent and nonerythematous, no uvula deviation or swelling. Normal phonation. Eyes:     Conjunctiva/sclera: Conjunctivae normal.  Cardiovascular:     Rate and Rhythm: Normal rate.  Pulmonary:     Effort: Pulmonary effort is normal. No respiratory distress.     Comments: Breathing comfortably at rest, CTABL, no wheezing, rales or other adventitious sounds auscultated Abdominal:     General: There is no distension.  Musculoskeletal:        General: Normal range of motion.     Cervical back: Neck supple.  Skin:    General: Skin is warm and dry.  Neurological:     Mental Status: He is alert and oriented to person, place, and time.      UC Treatments / Results  Labs (all labs ordered are listed, but only abnormal results are displayed) Labs Reviewed - No data to display  EKG   Radiology No results found.  Procedures Ear Cerumen Removal  Date/Time: 09/04/2020 8:50 AM Performed by: Alexsys Eskin, [place] C, PA-C Authorized by: Bettyanne Dittman, Elesa Hacker, PA-C   Consent:    Consent obtained:  Verbal   Consent given by:  Patient   Risks discussed:  Bleeding, infection and pain   Alternatives discussed:  No treatment Procedure details:    Location:  L ear and R ear   Procedure type: curette     Post-procedure details:    Inspection:  TM intact (left canal with erythema, no edema)   Hearing quality:  Improved   Patient tolerance of procedure:  Tolerated well, no immediate complications Comments:     Complete removal on right, partial removal on left   (including critical Melton time)  Medications Ordered in UC Medications - No data to display  Initial Impression / Assessment and Plan / UC Course  I have reviewed the triage vital signs and the nursing notes.  Pertinent labs & imaging results that were available during my Melton of the patient were reviewed by me and considered in my medical decision making (see chart for details).     Cerumen impaction, removal by curette by myself as well as irrigation by nursing staff.  Symptoms moderately improved after removal, may still have some underlying eustachian tube dysfunction.  Has been using nasal sprays and antihistamines without relief.  Will provide course of prednisone as trial.  Continue to monitor.  No sign of infection at this time requiring antibiotics.  Discussed strict return precautions. Patient verbalized understanding and is agreeable with plan.  Final Clinical Impressions(s) / UC Diagnoses   Final diagnoses:  Impacted cerumen of left ear     Discharge Instructions     Wax removed from both sides appears Continue Zyrtec and Nasonex If continuing to feel a full/muffled sensation may do trial of prednisone over the next 6 days-begin with 6 tablets on  day 1, decrease by 1 tablet each day until complete-6, 5, 4, 3, 2, 1-take with food and in the morning Continue to monitor, follow-up if not improving or worsening    ED Prescriptions    Medication Sig Dispense Auth. Provider   predniSONE (DELTASONE) 10 MG tablet Begin with 6 tabs on day 1, 5 tab on day 2, 4 tab on day 3, 3 tab on day 4, 2 tab on day 5, 1 tab on day 6-take with food 21 tablet Reta Norgren C, PA-C     PDMP not reviewed this encounter.    Janith Lima, [place] 09/04/20 [phone]

## 2020-09-04 NOTE — ED Triage Notes (Signed)
Pt c/o lt ear pressure and fullness x1wks.

## 2020-09-04 NOTE — Discharge Instructions (Signed)
Wax removed from both sides appears Continue Zyrtec and Nasonex If continuing to feel a full/muffled sensation may do trial of prednisone over the next 6 days-begin with 6 tablets on day 1, decrease by 1 tablet each day until complete-6, 5, 4, 3, 2, 1-take with food and in the morning Continue to monitor, follow-up if not improving or worsening

## 2020-09-09 ENCOUNTER — Other Ambulatory Visit: Payer: Self-pay | Admitting: Gastroenterology

## 2020-09-16 ENCOUNTER — Other Ambulatory Visit: Payer: Self-pay | Admitting: Gastroenterology

## 2020-09-23 ENCOUNTER — Ambulatory Visit (INDEPENDENT_AMBULATORY_CARE_PROVIDER_SITE_OTHER): Payer: 59 | Admitting: Internal Medicine

## 2020-09-23 ENCOUNTER — Other Ambulatory Visit: Payer: Self-pay

## 2020-09-23 ENCOUNTER — Encounter: Payer: Self-pay | Admitting: Internal Medicine

## 2020-09-23 VITALS — BP 127/85 | HR 78 | Temp 97.9°F | Ht 72.0 in | Wt 263.0 lb

## 2020-09-23 DIAGNOSIS — G4700 Insomnia, unspecified: Secondary | ICD-10-CM | POA: Diagnosis not present

## 2020-09-23 DIAGNOSIS — R61 Generalized hyperhidrosis: Secondary | ICD-10-CM

## 2020-09-23 DIAGNOSIS — E119 Type 2 diabetes mellitus without complications: Secondary | ICD-10-CM | POA: Diagnosis not present

## 2020-09-23 DIAGNOSIS — R9089 Other abnormal findings on diagnostic imaging of central nervous system: Secondary | ICD-10-CM | POA: Diagnosis not present

## 2020-09-23 NOTE — Progress Notes (Signed)
Subjective:    Patient ID: Wayne Melton, male    DOB: 1970/12/06, 49 y.o.   MRN: [id number]  DOS:  09/23/2020 Type of visit - description: Follow-up, several issues:  Went to the ER for a headache, chart reviewed. No further headaches, no neck stiffness  DM: Good med compliance, last A1c was very good.  Also reports insomnia and night sweats, going on for approximately 4 weeks. When asked, admits to a lot of stress, frustration, work related. He is working from home.  As far as  night sweats, he denies fever or weight loss. Has diarrhea at baseline from IBS.  No nausea vomiting or blood in the stools. No cough Occasionally feel slightly depressed but denies any suicidal ideas.  Review of Systems See above  Past Medical History:  Diagnosis Date  . Allergic rhinitis 07/05/2012  . Constipation    chronic  . Diabetes mellitus without complication (HCC)    No meds  . Gastroparesis   . GERD , HH, h/o PUD 07/05/2012  . Hyperlipidemia   . IBS (irritable bowel syndrome) 11/06/2012  . Obesity   . OSA (obstructive sleep apnea)    mild  . Seasonal allergies   . Sleep apnea     Past Surgical History:  Procedure Laterality Date  . COLONOSCOPY    . UPPER GASTROINTESTINAL ENDOSCOPY      Allergies as of 09/23/2020   No Known Allergies     Medication List       Accurate as of September 23, 2020  9:19 PM. If you have any questions, ask your nurse or doctor.        STOP taking these medications   predniSONE 10 MG tablet Commonly known as: DELTASONE Stopped by: Kathlene November, MD     TAKE these medications   acetaminophen 500 MG tablet Commonly known as: TYLENOL Take 1,000 mg by mouth every 6 (six) hours as needed for moderate pain.   atorvastatin 20 MG tablet Commonly known as: LIPITOR Take 1 tablet (20 mg total) by mouth at bedtime.   azelastine 0.1 % nasal spray Commonly known as: ASTELIN Place 2 sprays into both nostrils at bedtime as needed for rhinitis or  allergies. Use in each nostril as directed   cetirizine 10 MG tablet Commonly known as: ZYRTEC Take 10 mg by mouth daily.   fluticasone 50 MCG/ACT nasal spray Commonly known as: FLONASE Place 2 sprays into both nostrils daily.   Gaviscon 80-14.2 MG Chew Generic drug: Alum Hydroxide-Mag Trisilicate Chew 1 capsule by mouth daily as needed.   Hyoscyamine Sulfate SL 0.125 MG Subl Commonly known as: Levsin/SL Place 0.125 mg under the tongue every 4 (four) hours as needed (abd pain).   metFORMIN 850 MG tablet Commonly known as: GLUCOPHAGE Take 1 tablet (850 mg total) by mouth 2 (two) times daily with a meal.   metoCLOPramide 10 MG tablet Commonly known as: REGLAN Take 10 mg by mouth every 6 (six) hours as needed for nausea. What changed: Another medication with the same name was removed. Continue taking this medication, and follow the directions you see here. Changed by: Kathlene November, MD   metoCLOPramide 5 MG tablet Commonly known as: REGLAN Take 1 tablet by mouth 2 to 3 times a day as needed.  PLEASE SCHEDULE AN OFFICE VISIT FOR FURTHER REFILLS. Thank you What changed: Another medication with the same name was removed. Continue taking this medication, and follow the directions you see here. Changed by: Kathlene November, MD  omeprazole 40 MG capsule Commonly known as: PRILOSEC Take 1 capsule (40 mg total) by mouth 2 (two) times daily. Please keep your Feb. 2022 appt for further refills.   ondansetron 4 MG disintegrating tablet Commonly known as: Zofran ODT Take 1 tablet (4 mg total) by mouth every 6 (six) hours as needed for nausea or vomiting.   polyethylene glycol 17 g packet Commonly known as: MiraLax Take 17 g by mouth daily.   Qvar RediHaler 80 MCG/ACT inhaler Generic drug: beclomethasone Inhale 2 puffs into the lungs daily.          Objective:   Physical Exam BP 127/85 (BP Location: Right Arm, Patient Position: Sitting, Cuff Size: Large)   Pulse 78   Temp 97.9 F (36.6  C) (Oral)   Ht 6' (1.829 m)   Wt 263 lb (119.3 kg)   SpO2 98%   BMI 35.67 kg/m  General:   Well developed, NAD, BMI noted. HEENT:  Normocephalic . Face symmetric, atraumatic Lungs:  CTA B Normal respiratory effort, no intercostal retractions, no accessory muscle use. Heart: RRR,  no murmur.  Lower extremities: no pretibial edema bilaterally  Skin: Not pale. Not jaundice Neurologic:  alert & oriented X3.  Speech normal, gait appropriate for age and unassisted Psych--  Cognition and judgment appear intact.  Cooperative with normal attention span and concentration.  Behavior appropriate. No anxious or depressed appearing.      Assessment     Assessment DM Hyperlipidemia Bronchospasm (on Qvar) GI: --h/o PUD (EGD @ [facility]  2002: neg  but reports had another EGD with ulcer) --GERD, IBS, occ constipation, gastroparesis (+ gastric emptying study October 2020) -- fatty liver per u/s 06-2015 -EGD 08-2017: Gastritis, polyps, benign findings per GI letter, no follow-up OSA:  (-)  home sleep apnea test 2014,  Full sleep study~08-2015 : mild OSA, saw pulmonary, rx dental appliance, wt loss HST 08-2018: Rx CPAP - DOE x years>>>>  neg stress test 03/2015 Abnormal brain MRI 05-2020 (subsequently saw neurosurgery, recommend observation, per patient, no documentation)  PLAN DM: On Metformin, check A1c. Insomnia: Probably related to stress, he uses a CPAP and sleeps better with it. Tips for healthy sleep and stress management discussed. Recommend melatonin nightly. Declined medications for now but will call if/when ready. Night sweats: See HPI, no fever or weight loss.  For completeness we will get a CBC with blood smear. Headache: Went to the ER May 10, 2020, had a acute, intense headache. CT and CTA of the head:  Negative except for "Bulbous appearance of the of the left MCA bifurcation measuring 2-3 mm, and giving rise to the superior and inferior division roximal M2 left MCA  branches. This could reflect anatomic variation or a small aneurysm". In consultation with neurology, LP was done to rule out a bleeding: Patient states he went to see a neurosurgeon (no documentation) it was told to follow-up as needed. No further headaches, no neck stiffness, recommend observation. Preventive care: Strongly encouraged to get COVID vaccination booster ASAP. RTC already scheduled in few months    This visit occurred during the SARS-CoV-2 public health emergency.  Safety protocols were in place, including screening questions prior to the visit, additional usage of staff PPE, and extensive cleaning of exam room while observing appropriate contact time as indicated for disinfecting solutions.

## 2020-09-23 NOTE — Patient Instructions (Addendum)
Per our records you are due for an eye exam. Please contact your eye doctor to schedule an appointment. Please have them send copies of your office visit notes to [place]. Our fax number is [phone].  To help you sleep, take melatonin 8 mg every night.  HEALTHY SLEEP Sleep hygiene: Basic rules for a good night's sleep  Sleep only as much as you need to feel rested and then get out of bed  Keep a regular sleep schedule  Avoid forcing sleep  Exercise regularly for at least 20 minutes, preferably 4 to 5 hours before bedtime  Avoid caffeinated beverages after lunch  Avoid alcohol near bedtime: no "night cap"  Avoid smoking, especially in the evening  Do not go to bed hungry  Adjust bedroom environment  Avoid prolonged use of light-emitting screens before bedtime   Deal with your worries before bedtime     GO TO THE LAB : Get the blood work

## 2020-09-23 NOTE — Assessment & Plan Note (Signed)
DM: On Metformin, check A1c. Insomnia: Probably related to stress, he uses a CPAP and sleeps better with it. Tips for healthy sleep and stress management discussed. Recommend melatonin nightly. Declined medications for now but will call if/when ready. Night sweats: See HPI, no fever or weight loss.  For completeness we will get a CBC with blood smear. Headache: Went to the ER May 10, 2020, had a acute, intense headache. CT and CTA of the head:  Negative except for "Bulbous appearance of the of the left MCA bifurcation measuring 2-3 mm, and giving rise to the superior and inferior division roximal M2 left MCA branches. This could reflect anatomic variation or a small aneurysm". In consultation with neurology, LP was done to rule out a bleeding: Patient states he went to see a neurosurgeon (no documentation) it was told to follow-up as needed. No further headaches, no neck stiffness, recommend observation. Preventive care: Strongly encouraged to get COVID vaccination booster ASAP. RTC already scheduled in few months

## 2020-09-24 ENCOUNTER — Other Ambulatory Visit: Payer: Self-pay

## 2020-09-24 DIAGNOSIS — E119 Type 2 diabetes mellitus without complications: Secondary | ICD-10-CM

## 2020-09-24 LAB — CBC WITH DIFFERENTIAL/PLATELET
Basophils Absolute: 0.1 10*3/uL (ref 0.0–0.1)
Basophils Relative: 0.6 % (ref 0.0–3.0)
Eosinophils Absolute: 0.1 10*3/uL (ref 0.0–0.7)
Eosinophils Relative: 0.8 % (ref 0.0–5.0)
HCT: 44.5 % (ref 39.0–52.0)
Hemoglobin: 15 g/dL (ref 13.0–17.0)
Lymphocytes Relative: 23.9 % (ref 12.0–46.0)
Lymphs Abs: 2.5 10*3/uL (ref 0.7–4.0)
MCHC: 33.7 g/dL (ref 30.0–36.0)
MCV: 89.6 fl (ref 78.0–100.0)
Monocytes Absolute: 0.6 10*3/uL (ref 0.1–1.0)
Monocytes Relative: 5.7 % (ref 3.0–12.0)
Neutro Abs: 7.1 10*3/uL (ref 1.4–7.7)
Neutrophils Relative %: 69 % (ref 43.0–77.0)
Platelets: 353 10*3/uL (ref 150.0–400.0)
RBC: 4.97 Mil/uL (ref 4.22–5.81)
RDW: 13.5 % (ref 11.5–15.5)
WBC: 10.3 10*3/uL (ref 4.0–10.5)

## 2020-09-24 LAB — PATHOLOGIST SMEAR REVIEW

## 2020-09-24 LAB — HEMOGLOBIN A1C: Hgb A1c MFr Bld: 7.3 % — ABNORMAL HIGH (ref 4.6–6.5)

## 2020-09-24 MED ORDER — METFORMIN HCL 850 MG PO TABS: 850.0000 mg | ORAL_TABLET | Freq: Two times a day (BID) | ORAL | 1 refills | Status: DC

## 2020-10-11 ENCOUNTER — Other Ambulatory Visit: Payer: Self-pay | Admitting: Gastroenterology

## 2020-11-07 ENCOUNTER — Other Ambulatory Visit: Payer: Self-pay

## 2020-11-07 ENCOUNTER — Ambulatory Visit (INDEPENDENT_AMBULATORY_CARE_PROVIDER_SITE_OTHER): Payer: 59 | Admitting: Gastroenterology

## 2020-11-07 ENCOUNTER — Encounter: Payer: Self-pay | Admitting: Gastroenterology

## 2020-11-07 VITALS — BP 120/80 | HR 80 | Ht 71.5 in | Wt 264.2 lb

## 2020-11-07 DIAGNOSIS — K219 Gastro-esophageal reflux disease without esophagitis: Secondary | ICD-10-CM

## 2020-11-07 DIAGNOSIS — R194 Change in bowel habit: Secondary | ICD-10-CM | POA: Diagnosis not present

## 2020-11-07 DIAGNOSIS — Z86010 Personal history of colonic polyps: Secondary | ICD-10-CM | POA: Diagnosis not present

## 2020-11-07 DIAGNOSIS — K3184 Gastroparesis: Secondary | ICD-10-CM

## 2020-11-07 DIAGNOSIS — Z8371 Family history of colonic polyps: Secondary | ICD-10-CM

## 2020-11-07 MED ORDER — METOCLOPRAMIDE HCL 5 MG PO TABS: ORAL_TABLET | ORAL | 0 refills | Status: DC

## 2020-11-07 NOTE — Patient Instructions (Signed)
If you are age 48 or older, your body mass index should be between 23-30. Your Body mass index is 36.34 kg/m. If this is out of the aforementioned range listed, please consider follow up with your Primary Care Provider.  If you are age 74 or younger, your body mass index should be between 19-25. Your Body mass index is 36.34 kg/m. If this is out of the aformentioned range listed, please consider follow up with your Primary Care Provider.   Continue omeprazole  Take Imodium as needed.  We have sent the following medications to your pharmacy for you to pick up at your convenience: Reglan 5 mg   It has been recommended to you by your physician that you have a(n) Colonoscopy completed. Per your request, we did not schedule the procedure(s) today. Please contact our office at [phone] should you decide to have the procedure completed. You will be scheduled for a pre-visit and procedure at that time.  Thank you for entrusting me with your care and for choosing [facility], Dr. [place] Cellar

## 2020-11-07 NOTE — Progress Notes (Signed)
HPI :  50 year old male here for a follow-up visit for reflux and gastroparesis, as well as altered bowel habits.  See prior notes for details of his history.  He has had reflux symptoms with early satiety and some dyspepsia in recent years.  He previously had an EGD in 2018 which showed a few small linear ulcerations and some benign gastric polyps, negative biopsies for H. pylori and no esophagitis.  He is also had a prior CT scan and ultrasound of his right upper quadrant to evaluate abdominal pain which did not show any clear source otherwise. In November 2020 he had an abnormal gastric emptying study c/w gastroparesis.   He has been on Reglan since that time dosed at 5 mg twice daily.  This has generally helped his symptoms and its not nearly as bad as it was before.  He previously was on Dexilant for his reflux and we transitioned him to omeprazole 40 twice daily due to cost.  This is generally working okay, he rarely has some breakthrough symptoms that bother him but not frequently.  No dysphagia.  He tries to eat smaller portions and avoid high risk foods in light of his gastroparesis.  We discussed options moving forward for management of his gastroparesis.  He appears to tolerate the Reglan well.  He otherwise has been having persistent loose stools for the past year or so.  He will have 1 bowel movement a day but the form is usually loose and watery, rarely with some urgency.  Previously at the last visit he was having alternating loose stools and constipation at that time I had recommended Citrucel daily.  He has not been taking that recently.  He has been on a higher dose of Metformin for the past year.  He is not tried Imodium or anything specific for his bowels.  He inquires about timing of his next colonoscopy.  He had a colonoscopy in March 2017 with one small adenoma.  Recall that his mother had colon polyps at age 54 and he has 2 second-degree relatives with colon cancer, and his  brother had Crohn's disease recently diagnosed.   Prior workup: EGD 08/16/2017 - few small linear based ulcers, normal esophagus, multiple small gastric polyps, - benign biopsies Colonoscopy 12/09/15 - 80mm transverse TA, otherwise normal exam   US abdomen 10/23/18 - diffuse hepatic steatosis,Two hypoechoic liver masses which were not definitely seen on previous study, but are suspicious for areas of benign focal fatty sparing.  CT abdomen / pelvis - 11/04/18 -IMPRESSION: 1. No acute intra-abdominal process. 2. Hepatic steatosis.   GES 08/09/19 -  Delayed gastric emptying study. Findings suggest gastroparesis.    Past Medical History:  Diagnosis Date  . Allergic rhinitis 07/05/2012  . Constipation    chronic  . Diabetes mellitus without complication (HCC)    No meds  . Gastroparesis   . GERD , HH, h/o PUD 07/05/2012  . Hyperlipidemia   . IBS (irritable bowel syndrome) 11/06/2012  . Obesity   . OSA (obstructive sleep apnea)    mild  . Seasonal allergies   . Sleep apnea      Past Surgical History:  Procedure Laterality Date  . COLONOSCOPY    . UPPER GASTROINTESTINAL ENDOSCOPY     Family History  Problem Relation Age of Onset  . Pancreatitis Mother   . Diabetes Mother   . Hyperlipidemia Mother   . Colon polyps Mother        late 73s  . Mitral  valve prolapse Mother   . Colon cancer Maternal Uncle        uncle in his 66s  . Epilepsy Father   . Crohn's disease Brother   . Colon cancer Maternal Grandfather   . CAD Other        GF in his 75s  . Irritable bowel syndrome Other   . Colitis Other   . Heart disease Other   . Prostate cancer Neg Hx    Social History   Tobacco Use  . Smoking status: Former Smoker    Packs/day: 0.50    Types: Cigarettes    Start date: 2007    Quit date: 10/04/2008    Years since quitting: 12.1  . Smokeless tobacco: Never Used  Vaping Use  . Vaping Use: Never used  Substance Use Topics  . Alcohol use: No    Alcohol/week: 0.0  standard drinks    Comment: rare  . Drug use: No   Current Outpatient Medications  Medication Sig Dispense Refill  . acetaminophen (TYLENOL) 500 MG tablet Take 1,000 mg by mouth every 6 (six) hours as needed for moderate pain.    Marland Kitchen Alum Hydroxide-Mag Trisilicate (GAVISCON) [id number] MG CHEW Chew 1 capsule by mouth daily as needed. 224 tablet   . atorvastatin (LIPITOR) 20 MG tablet Take 1 tablet (20 mg total) by mouth at bedtime. 90 tablet 3  . azelastine (ASTELIN) 0.1 % nasal spray Place 2 sprays into both nostrils at bedtime as needed for rhinitis or allergies. Use in each nostril as directed 30 mL 5  . beclomethasone (QVAR REDIHALER) 80 MCG/ACT inhaler Inhale 2 puffs into the lungs daily. 10.6 g 5  . cetirizine (ZYRTEC) 10 MG tablet Take 10 mg by mouth daily.    . fluticasone (FLONASE) 50 MCG/ACT nasal spray Place 2 sprays into both nostrils daily. 16 g 0  . metFORMIN (GLUCOPHAGE) 850 MG tablet Take 1 tablet (850 mg total) by mouth 2 (two) times daily with a meal. 180 tablet 1  . metoCLOPramide (REGLAN) 5 MG tablet TAKE 1 TABLET BY MOUTH 2 TO 3 TIMES A DAY AS NEEDED. PLEASE SCHEDULE AN OFFICE VISIT 60 tablet 0  . omeprazole (PRILOSEC) 40 MG capsule Take 1 capsule (40 mg total) by mouth 2 (two) times daily. Please keep your Feb. 2022 appt for further refills. 180 capsule 0  . polyethylene glycol (MIRALAX) 17 g packet Take 17 g by mouth daily. 14 each 0  . Hyoscyamine Sulfate SL (LEVSIN/SL) 0.125 MG SUBL Place 0.125 mg under the tongue every 4 (four) hours as needed (abd pain). (Patient not taking: Reported on 11/07/2020) 20 tablet 0  . ondansetron (ZOFRAN ODT) 4 MG disintegrating tablet Take 1 tablet (4 mg total) by mouth every 6 (six) hours as needed for nausea or vomiting. (Patient not taking: No sig reported) 20 tablet 0   No current facility-administered medications for this visit.   No Known Allergies   Review of Systems: All systems reviewed and negative except where noted in HPI.    Lab Results  Component Value Date   WBC 10.3 09/23/2020   HGB 15.0 09/23/2020   HCT 44.5 09/23/2020   MCV 89.6 09/23/2020   PLT 353.0 09/23/2020    Lab Results  Component Value Date   CREATININE 1.02 05/10/2020   BUN 9 05/10/2020   NA 138 05/10/2020   K 4.4 05/10/2020   CL 101 05/10/2020   CO2 29 05/10/2020    Lab Results  Component Value Date  ALT 41 02/05/2020   AST 12 02/05/2020   ALKPHOS 86 12/19/2019   BILITOT 0.5 12/19/2019     Physical Exam: BP 120/80 (BP Location: Left Arm, Patient Position: Sitting, Cuff Size: Normal)   Pulse 80   Ht 5' 11.5" (1.816 m)   Wt 264 lb 4 oz (119.9 kg)   BMI 36.34 kg/m  Constitutional: Pleasant,well-developed, male in no acute distress. Neurological: Alert and oriented to person place and time. Psychiatric: Normal mood and affect. Behavior is normal.   ASSESSMENT AND PLAN: 50 year old male here for reassessment the following:  Gastroparesis GERD Altered bowel habits History of colon polyps  Generally doing okay on the regimen as outlined above.  We did discuss long-term Reglan use, he tolerates it quite well.  We discussed the risk for tar dive dyskinesia which is very rare and usually only at high doses, he is comfortable continue at low doses but I encouraged him to take some drug holidays from [place] periodically to prevent developing tolerance.  We did discuss long-term risk benefits of chronic PPI use, unfortunately does require high-dose PPI to maintain control of his symptoms in the setting of gastroparesis.  If he has worsening symptoms while taking a Reglan holiday he can go to a more soft or liquid diet which he may tolerate better.  Otherwise in regards to his loose stools, time course seems to line up with his Metformin dosing change.  He may want to try reducing his Metformin dose slightly and seeing how that affects his bowels.  He can discuss his Metformin dosing with his primary care long-term, if indeed this is  thought to cause his bowel changes.  He can also try taking Imodium as needed.  We discussed timing of his next colonoscopy, given his mother had colon polyps in her 52s, 2 second-degree relatives with colon cancer and the fact that he had an adenoma 5 years ago, I think it is reasonable to do another colonoscopy this year.  He wants to hold off on it right now due to other things going on in his life but he will call to schedule when he is ready.  If his loose stools persists colonoscopy would also evaluate that.  He agreed  Artesian Cellar, MD [facility]

## 2020-12-07 ENCOUNTER — Other Ambulatory Visit: Payer: Self-pay | Admitting: Gastroenterology

## 2020-12-19 ENCOUNTER — Ambulatory Visit (INDEPENDENT_AMBULATORY_CARE_PROVIDER_SITE_OTHER): Payer: 59 | Admitting: Internal Medicine

## 2020-12-19 ENCOUNTER — Other Ambulatory Visit: Payer: Self-pay

## 2020-12-19 VITALS — BP 126/80 | HR 90 | Temp 98.1°F | Ht 71.5 in | Wt 262.0 lb

## 2020-12-19 DIAGNOSIS — G4700 Insomnia, unspecified: Secondary | ICD-10-CM

## 2020-12-19 DIAGNOSIS — E119 Type 2 diabetes mellitus without complications: Secondary | ICD-10-CM | POA: Diagnosis not present

## 2020-12-19 MED ORDER — METFORMIN HCL ER 750 MG PO TB24: 1500.0000 mg | ORAL_TABLET | Freq: Every day | ORAL | 6 refills | Status: DC

## 2020-12-19 NOTE — Progress Notes (Signed)
Subjective:    Patient ID: Wayne Melton, male    DOB: November 18, 1970, 50 y.o.   MRN: [id number]  DOS:  12/19/2020 Type of visit - description: Follow-up DM: Since the last visit, Metformin dose increased and pt noted a increase on diarrhea. No ambulatory CBGs Insomnia: Still having some issues. Night sweats, definitely decreased. GI note reviewed. OSA, good compliance with CPAP  Wt Readings from Last 3 Encounters:  12/19/20 262 lb (118.8 kg)  11/07/20 264 lb 4 oz (119.9 kg)  09/23/20 263 lb (119.3 kg)     Review of Systems See above   Past Medical History:  Diagnosis Date  . Allergic rhinitis 07/05/2012  . Constipation    chronic  . Diabetes mellitus without complication (HCC)    No meds  . Gastroparesis   . GERD , HH, h/o PUD 07/05/2012  . Hyperlipidemia   . IBS (irritable bowel syndrome) 11/06/2012  . Obesity   . OSA (obstructive sleep apnea)    mild  . Seasonal allergies   . Sleep apnea     Past Surgical History:  Procedure Laterality Date  . COLONOSCOPY    . UPPER GASTROINTESTINAL ENDOSCOPY      Allergies as of 12/19/2020   No Known Allergies     Medication List       Accurate as of December 19, 2020 11:59 PM. If you have any questions, ask your nurse or doctor.        STOP taking these medications   azelastine 0.1 % nasal spray Commonly known as: ASTELIN Stopped by: Kathlene November, MD   cetirizine 10 MG tablet Commonly known as: ZYRTEC Stopped by: Kathlene November, MD   fluticasone 50 MCG/ACT nasal spray Commonly known as: FLONASE Stopped by: Kathlene November, MD   Gaviscon 80-14.2 MG Chew Generic drug: Alum Hydroxide-Mag Trisilicate Stopped by: Kathlene November, MD   metFORMIN 850 MG tablet Commonly known as: GLUCOPHAGE Replaced by: metFORMIN 750 MG 24 hr tablet Stopped by: Kathlene November, MD     TAKE these medications   acetaminophen 500 MG tablet Commonly known as: TYLENOL Take 1,000 mg by mouth every 6 (six) hours as needed for moderate pain.   ALLEGRA ALLERGY  PO Take by mouth.   atorvastatin 20 MG tablet Commonly known as: LIPITOR Take 1 tablet (20 mg total) by mouth at bedtime.   Hyoscyamine Sulfate SL 0.125 MG Subl Commonly known as: Levsin/SL Place 0.125 mg under the tongue every 4 (four) hours as needed (abd pain).   metFORMIN 750 MG 24 hr tablet Commonly known as: GLUCOPHAGE-XR Take 2 tablets (1,500 mg total) by mouth daily with breakfast. Replaces: metFORMIN 850 MG tablet Started by: Kathlene November, MD   metoCLOPramide 5 MG tablet Commonly known as: REGLAN TAKE 1 TABLET BY MOUTH 2 TO 3 TIMES A DAY AS NEEDED   omeprazole 40 MG capsule Commonly known as: PRILOSEC Take 1 capsule (40 mg total) by mouth 2 (two) times daily.   ondansetron 4 MG disintegrating tablet Commonly known as: Zofran ODT Take 1 tablet (4 mg total) by mouth every 6 (six) hours as needed for nausea or vomiting.   polyethylene glycol 17 g packet Commonly known as: MiraLax Take 17 g by mouth daily.   Qvar RediHaler 80 MCG/ACT inhaler Generic drug: beclomethasone Inhale 2 puffs into the lungs daily.   triamcinolone 55 MCG/ACT Aero nasal inhaler Commonly known as: NASACORT          Objective:   Physical Exam BP 126/80 (BP  Location: Left Arm, Patient Position: Sitting, Cuff Size: Large)   Pulse 90   Temp 98.1 F (36.7 C) (Oral)   Ht 5' 11.5" (1.816 m)   Wt 262 lb (118.8 kg)   SpO2 98%   BMI 36.03 kg/m  General:   Well developed, NAD, BMI noted. HEENT:  Normocephalic . Face symmetric, atraumatic Lungs:  CTA B Normal respiratory effort, no intercostal retractions, no accessory muscle use. Heart: RRR,  no murmur.  Lower extremities: no pretibial edema bilaterally  Skin: Not pale. Not jaundice Neurologic:  alert & oriented X3.  Speech normal, gait appropriate for age and unassisted Psych--  Cognition and judgment appear intact.  Cooperative with normal attention span and concentration.  Behavior appropriate. No anxious or depressed appearing.       Assessment     Assessment DM Hyperlipidemia Bronchospasm (on Qvar) GI: --h/o PUD (EGD @ [facility]: neg  but reports had another EGD with ulcer) --GERD, IBS, occ constipation, gastroparesis (+ gastric emptying study October 2020) -- fatty liver per u/s 06-2015 -EGD 08-2017: Gastritis, polyps, benign findings per GI letter, no follow-up OSA:  (-)  home sleep apnea test 2014,  Full sleep study~08-2015 : mild OSA, saw pulmonary, rx dental appliance, wt loss HST 08-2018: Rx CPAP - DOE x years>>>>  neg stress test 03/2015 Abnormal brain MRI 05-2020 (subsequently saw neurosurgery, recommend observation, per patient, no documentation)  PLAN DM: Last A1c 7.3, Metformin dose increased to 850 mg TID, has definitely noticed a relationship with a higher dose of Metformin and diarrhea. We agreed to change to Metformin extended release 750 mg twice daily, check a A1c and micro. Encourage diet and exercise, still having issues with his knee, not sure how much he can change. Insomnia: Is doing better with sleep habits, still has some insomnia, decline medication.   Night sweats: Again they are going on for about a year, since the last visita  blood smear was negative, frequency of night sweats has significantly decreased to maybe 1 or 2 episodes a  month.  No weight loss.  We agreed on observation. GI: Saw GI 11/07/2020, he has a history of gastroparesis, GERD, doing relatively well on Reglan. They are planning a colonoscopy for this year RTC: 4 months     This visit occurred during the SARS-CoV-2 public health emergency.  Safety protocols were in place, including screening questions prior to the visit, additional usage of staff PPE, and extensive cleaning of exam room while observing appropriate contact time as indicated for disinfecting solutions.

## 2020-12-19 NOTE — Patient Instructions (Signed)
Change to Metformin to extended release 750 mg: 2 tablets in the morning (or if you prefer a tablet with your breakfast and a tablet with your dinner)   GO TO THE LAB : get the blood work and a urine sample   GO TO THE FRONT DESK, [place] back for a checkup in 4 months

## 2020-12-20 LAB — COMPREHENSIVE METABOLIC PANEL
AG Ratio: 1.9 (calc) (ref 1.0–2.5)
ALT: 67 U/L — ABNORMAL HIGH (ref 9–46)
AST: 15 U/L (ref 10–40)
Albumin: 4.5 g/dL (ref 3.6–5.1)
Alkaline phosphatase (APISO): 84 U/L (ref 36–130)
BUN: 12 mg/dL (ref 7–25)
CO2: 24 mmol/L (ref 20–32)
Calcium: 9.7 mg/dL (ref 8.6–10.3)
Chloride: 102 mmol/L (ref 98–110)
Creat: 0.89 mg/dL (ref 0.60–1.35)
Globulin: 2.4 g/dL (calc) (ref 1.9–3.7)
Glucose, Bld: 108 mg/dL — ABNORMAL HIGH (ref 65–99)
Potassium: 4.1 mmol/L (ref 3.5–5.3)
Sodium: 138 mmol/L (ref 135–146)
Total Bilirubin: 0.4 mg/dL (ref 0.2–1.2)
Total Protein: 6.9 g/dL (ref 6.1–8.1)

## 2020-12-20 LAB — HEMOGLOBIN A1C
Hgb A1c MFr Bld: 7.2 % of total Hgb — ABNORMAL HIGH (ref <5.7)
Mean Plasma Glucose: 160 mg/dL
eAG (mmol/L): 8.9 mmol/L

## 2020-12-20 NOTE — Assessment & Plan Note (Signed)
DM: Last A1c 7.3, Metformin dose increased to 850 mg TID, has definitely noticed a relationship with a higher dose of Metformin and diarrhea. We agreed to change to Metformin extended release 750 mg twice daily, check a A1c and micro. Encourage diet and exercise, still having issues with his knee, not sure how much he can change. Insomnia: Is doing better with sleep habits, still has some insomnia, decline medication.   Night sweats: Again they are going on for about a year, since the last visita  blood smear was negative, frequency of night sweats has significantly decreased to maybe 1 or 2 episodes a  month.  No weight loss.  We agreed on observation. GI: Saw GI 11/07/2020, he has a history of gastroparesis, GERD, doing relatively well on Reglan. They are planning a colonoscopy for this year RTC: 4 months

## 2020-12-23 MED ORDER — EMPAGLIFLOZIN 10 MG PO TABS
10.0000 mg | ORAL_TABLET | Freq: Every day | ORAL | 4 refills | Status: DC
Start: 2020-12-23 — End: 2021-03-30

## 2020-12-23 NOTE — Addendum Note (Signed)
Addended byDamita Dunnings D on: 12/23/2020 02:21 PM   Modules accepted: Orders

## 2021-01-16 ENCOUNTER — Other Ambulatory Visit: Payer: Self-pay

## 2021-01-16 ENCOUNTER — Ambulatory Visit
Admission: EM | Admit: 2021-01-16 | Discharge: 2021-01-16 | Disposition: A | Discharge status: Home / Self Care | Payer: 59

## 2021-01-16 ENCOUNTER — Encounter: Payer: Self-pay | Admitting: Emergency Medicine

## 2021-01-16 DIAGNOSIS — R059 Cough, unspecified: Secondary | ICD-10-CM

## 2021-01-16 DIAGNOSIS — J069 Acute upper respiratory infection, unspecified: Secondary | ICD-10-CM | POA: Diagnosis not present

## 2021-01-16 MED ORDER — AZITHROMYCIN 250 MG PO TABS
250.0000 mg | ORAL_TABLET | Freq: Every day | ORAL | 0 refills | Status: DC
Start: 2021-01-16 — End: 2021-02-04

## 2021-01-16 NOTE — Discharge Instructions (Addendum)
I have sent in azithromycin for you to take. Take 2 tablets today, then one tablet daily for the next 4 days.  May use flonase as needed for nasal congestion  Follow up with this office or with primary care if symptoms are persisting.  Follow up in the ER for high fever, trouble swallowing, trouble breathing, other concerning symptoms.

## 2021-01-16 NOTE — ED Triage Notes (Signed)
Pt presents today with c/o runny nose/sinus congestion, cough, body aches and fatigue x 2 weeks. He has taken Covid test at [facility] on Monday was negative.

## 2021-01-16 NOTE — ED Provider Notes (Signed)
[place]   [id number] 01/16/21 Arrival Time: 0830   CC: COVID symptoms  SUBJECTIVE: History from: patient.  Nazaire Cordial Heldt is a 50 y.o. male who presents with rhinorrhea, nasal congestion, cough, body aches, fatigue for the last 2 weeks. Reports Covid PCR test x 5 days ago was negative with [facility]. Denies sick exposure to COVID, flu or strep. Denies recent travel. Has negative history of Covid. Has completed Covid vaccines and booster. Has not taken OTC medications for this. There are no aggravating or alleviating factors. Denies previous symptoms in the past. Denies fever, SOB, wheezing, nausea, changes in bowel or bladder habits.    ROS: As per HPI.  All other pertinent ROS negative.     Past Medical History:  Diagnosis Date  . Allergic rhinitis 07/05/2012  . Constipation    chronic  . Diabetes mellitus without complication (HCC)    No meds  . Gastroparesis   . GERD , HH, h/o PUD 07/05/2012  . Hyperlipidemia   . IBS (irritable bowel syndrome) 11/06/2012  . Obesity   . OSA (obstructive sleep apnea)    mild  . Seasonal allergies   . Seasonal allergies   . Sleep apnea    Past Surgical History:  Procedure Laterality Date  . COLONOSCOPY    . UPPER GASTROINTESTINAL ENDOSCOPY     No Known Allergies No current facility-administered medications on file prior to encounter.   Current Outpatient Medications on File Prior to Encounter  Medication Sig Dispense Refill  . atorvastatin (LIPITOR) 20 MG tablet Take 1 tablet (20 mg total) by mouth at bedtime. 90 tablet 3  . beclomethasone (QVAR REDIHALER) 80 MCG/ACT inhaler Inhale 2 puffs into the lungs daily. 10.6 g 5  . empagliflozin (JARDIANCE) 10 MG TABS tablet Take 1 tablet (10 mg total) by mouth daily before breakfast. 30 tablet 4  . Fexofenadine HCl (ALLEGRA ALLERGY PO) Take by mouth.    . fluticasone (FLONASE) 50 MCG/ACT nasal spray Place into the nose.    . metFORMIN (GLUCOPHAGE-XR) 750 MG 24 hr tablet Take 2 tablets  (1,500 mg total) by mouth daily with breakfast. 60 tablet 6  . metoCLOPramide (REGLAN) 5 MG tablet TAKE 1 TABLET BY MOUTH 2 TO 3 TIMES A DAY AS NEEDED 60 tablet 0  . omeprazole (PRILOSEC) 40 MG capsule Take 1 capsule (40 mg total) by mouth 2 (two) times daily. 180 capsule 1  . acetaminophen (TYLENOL) 500 MG tablet Take 1,000 mg by mouth every 6 (six) hours as needed for moderate pain.    Marland Kitchen Hyoscyamine Sulfate SL (LEVSIN/SL) 0.125 MG SUBL Place 0.125 mg under the tongue every 4 (four) hours as needed (abd pain). 20 tablet 0  . ondansetron (ZOFRAN ODT) 4 MG disintegrating tablet Take 1 tablet (4 mg total) by mouth every 6 (six) hours as needed for nausea or vomiting. 20 tablet 0  . polyethylene glycol (MIRALAX) 17 g packet Take 17 g by mouth daily. 14 each 0  . triamcinolone (NASACORT) 55 MCG/ACT AERO nasal inhaler      Social History   Socioeconomic History  . Marital status: Married    Spouse name: Not on file  . Number of children: 3  . Years of education: Not on file  . Highest education level: Not on file  Occupational History  . Occupation: busines Administrator, computers  Tobacco Use  . Smoking status: Former Smoker    Packs/day: 0.50    Types: Cigarettes    Start date: 2007  Quit date: 10/04/2008    Years since quitting: 12.2  . Smokeless tobacco: Never Used  Vaping Use  . Vaping Use: Never used  Substance and Sexual Activity  . Alcohol use: No    Alcohol/week: 0.0 standard drinks    Comment: rare  . Drug use: No  . Sexual activity: Not on file  Other Topics Concern  . Not on file  Social History Narrative   Household: pt, wife,    2001, 2007, 2008       Social Determinants of Radio broadcast assistant Strain: Not on [employer] Insecurity: Not on file  Transportation Needs: Not on file  Physical Activity: Not on file  Stress: Not on file  Social Connections: Not on file  Intimate Partner Violence: Not on file   Family History  Problem Relation Age of Onset  .  Pancreatitis Mother   . Diabetes Mother   . Hyperlipidemia Mother   . Colon polyps Mother        late 36s  . Mitral valve prolapse Mother   . Colon cancer Maternal Uncle        uncle in his 16s  . Epilepsy Father   . Crohn's disease Brother   . Colon cancer Maternal Grandfather   . CAD Other        GF in his 88s  . Irritable bowel syndrome Other   . Colitis Other   . Heart disease Other   . Prostate cancer Neg Hx     OBJECTIVE:  Vitals:   01/16/21 0847  BP: 127/87  Pulse: 89  Resp: 18  Temp: 98.3 F (36.8 C)  TempSrc: Oral  SpO2: 97%     General appearance: alert; appears fatigued, but nontoxic; speaking in full sentences and tolerating own secretions HEENT: NCAT; Ears: EACs clear, TMs pearly gray; Eyes: PERRL.  EOM grossly intact. Sinuses: nontender; Nose: nares patent with clear rhinorrhea, Throat: oropharynx erythematous, cobblestoning present, tonsils non erythematous or enlarged, uvula midline  Neck: supple with LAD Lungs: unlabored respirations, symmetrical air entry; cough: mild; no respiratory distress; diminished lung sounds to bilateral lower lobes Heart: regular rate and rhythm.  Radial pulses 2+ symmetrical bilaterally Skin: warm and dry Psychological: alert and cooperative; normal mood and affect     LABS:  No results found for this or any previous visit (from the past 24 hour(s)).   ASSESSMENT & PLAN:  1. Upper respiratory tract infection, unspecified type   2. Cough     Meds ordered this encounter  Medications  . azithromycin (ZITHROMAX) 250 MG tablet    Sig: Take 1 tablet (250 mg total) by mouth daily. Take first 2 tablets together, then 1 every day until finished.    Dispense:  6 tablet    Refill:  0    Order Specific Question:   Supervising Provider    Answer:   Chase Picket [[id number]]    Prescribed azithromycin for URI given length of symptoms and negative Covid testing Continue supportive care at home Get plenty of rest and push  fluids Use OTC zyrtec for nasal congestion, runny nose, and/or sore throat Use OTC flonase for nasal congestion and runny nose Use medications daily for symptom relief Use OTC medications like ibuprofen or tylenol as needed fever or pain Call or go to the ED if you have any new or worsening symptoms such as fever, worsening cough, shortness of breath, chest tightness, chest pain, turning blue, changes in mental status.  Reviewed  expectations re: course of current medical issues. Questions answered. Outlined signs and symptoms indicating need for more acute intervention. Patient verbalized understanding. After Visit Summary given.         Faustino Congress, NP 01/16/21 0900

## 2021-01-19 ENCOUNTER — Encounter: Payer: Self-pay | Admitting: Emergency Medicine

## 2021-01-19 ENCOUNTER — Ambulatory Visit
Admission: EM | Admit: 2021-01-19 | Discharge: 2021-01-19 | Disposition: A | Discharge status: Home / Self Care | Payer: 59 | Attending: Family Medicine | Admitting: Family Medicine

## 2021-01-19 ENCOUNTER — Other Ambulatory Visit: Payer: Self-pay

## 2021-01-19 DIAGNOSIS — R195 Other fecal abnormalities: Secondary | ICD-10-CM

## 2021-01-19 DIAGNOSIS — R140 Abdominal distension (gaseous): Secondary | ICD-10-CM

## 2021-01-19 DIAGNOSIS — R030 Elevated blood-pressure reading, without diagnosis of hypertension: Secondary | ICD-10-CM | POA: Diagnosis not present

## 2021-01-19 MED ORDER — ALUM & MAG HYDROXIDE-SIMETH 200-200-20 MG/5ML PO SUSP
30.0000 mL | Freq: Once | ORAL | Status: AC
  Administered 2021-01-19: 30 mL via ORAL

## 2021-01-19 MED ORDER — LIDOCAINE VISCOUS HCL 2 % MT SOLN
15.0000 mL | Freq: Once | OROMUCOSAL | Status: AC
  Administered 2021-01-19: 15 mL via ORAL

## 2021-01-19 NOTE — ED Provider Notes (Signed)
[facility]    CSN: [id number] Arrival date & time: 01/19/21  0827      History   Chief Complaint Chief Complaint  Patient presents with  . Hypertension    HPI Wayne Melton is a 50 y.o. male.   HPI Patient presents today with a concern of elevated blood pressure and abdominal upset. Patient reports upon awakening he did not feel in his normal state and checked his blood pressure and had 2 readings in the 140/100.  Patient has no history of hypertension and on arrival today blood pressure is normal.  Patient was seen on Friday and started on azithromycin for URI.  He reports that starting medication he has had bloating and has had loose stools today.  Patient is chronically on a GI regimen for treatment of gastroparesis, IBS, GERD.  Patient reports compliance with medication however currently is not taking Reglan.  He has had no fever.  Endorses passing of gas.    Past Medical History:  Diagnosis Date  . Allergic rhinitis 07/05/2012  . Constipation    chronic  . Diabetes mellitus without complication (HCC)    No meds  . Gastroparesis   . GERD , HH, h/o PUD 07/05/2012  . Hyperlipidemia   . IBS (irritable bowel syndrome) 11/06/2012  . Obesity   . OSA (obstructive sleep apnea)    mild  . Seasonal allergies   . Seasonal allergies   . Sleep apnea     Patient Active Problem List   Diagnosis Date Noted  . Cerebral aneurysm 05/14/2020  . Hyperlipidemia associated with type 2 diabetes mellitus ([place]) 04/19/2020  . Gastroparesis 12/18/2019  . DOE (dyspnea on exertion) 08/23/2019  . BMI 35.0-35.9,adult 09/15/2018  . OSA (obstructive sleep apnea) 10/14/2015  . Follow-up ---------------PCP NOTES 06/12/2015  . Diabetes mellitus without complication ([place]) 09/98/3382  . Fatigue 11/08/2014  . Annual physical exam 11/06/2012  . IBS (irritable bowel syndrome) 11/06/2012  . GERD , HH, h/o PUD 07/05/2012  . Allergic rhinitis 07/05/2012  . Hyperlipemia 07/05/2012     Past Surgical History:  Procedure Laterality Date  . COLONOSCOPY    . UPPER GASTROINTESTINAL ENDOSCOPY         Home Medications    Prior to Admission medications   Medication Sig Start Date End Date Taking? Authorizing Provider  acetaminophen (TYLENOL) 500 MG tablet Take 1,000 mg by mouth every 6 (six) hours as needed for moderate pain.    [provider]  atorvastatin (LIPITOR) 20 MG tablet Take 1 tablet (20 mg total) by mouth at bedtime. 02/15/20   Colon Branch, MD  azithromycin (ZITHROMAX) 250 MG tablet Take 1 tablet (250 mg total) by mouth daily. Take first 2 tablets together, then 1 every day until finished. 01/16/21   Faustino Congress, NP  beclomethasone (QVAR REDIHALER) 80 MCG/ACT inhaler Inhale 2 puffs into the lungs daily. 07/16/20   Colon Branch, MD  empagliflozin (JARDIANCE) 10 MG TABS tablet Take 1 tablet (10 mg total) by mouth daily before breakfast. 12/23/20   Colon Branch, MD  Fexofenadine HCl [facility] ALLERGY PO) Take by mouth.    [provider]  fluticasone (FLONASE) 50 MCG/ACT nasal spray Place into the nose.    [provider]  Hyoscyamine Sulfate SL (LEVSIN/SL) 0.125 MG SUBL Place 0.125 mg under the tongue every 4 (four) hours as needed (abd pain). 03/14/20   [name], [name], PA-C  metFORMIN (GLUCOPHAGE-XR) 750 MG 24 hr tablet Take 2 tablets (1,500 mg  total) by mouth daily with breakfast. 12/19/20   Colon Branch, MD  metoCLOPramide (REGLAN) 5 MG tablet TAKE 1 TABLET BY MOUTH 2 TO 3 TIMES A DAY AS NEEDED 11/07/20   Armbruster, Carlota Raspberry, MD  omeprazole (PRILOSEC) 40 MG capsule Take 1 capsule (40 mg total) by mouth 2 (two) times daily. 12/08/20   Armbruster, Carlota Raspberry, MD  ondansetron (ZOFRAN ODT) 4 MG disintegrating tablet Take 1 tablet (4 mg total) by mouth every 6 (six) hours as needed for nausea or vomiting. 03/14/20   [name], [name], PA-C  polyethylene glycol (MIRALAX) 17 g packet Take 17 g by mouth daily. 07/18/19   Yetta Flock, MD  triamcinolone  (NASACORT) 55 MCG/ACT AERO nasal inhaler  09/03/20   [provider]    Family History Family History  Problem Relation Age of Onset  . Pancreatitis Mother   . Diabetes Mother   . Hyperlipidemia Mother   . Colon polyps Mother        late 76s  . Mitral valve prolapse Mother   . Colon cancer Maternal Uncle        uncle in his 53s  . Epilepsy Father   . Crohn's disease Brother   . Colon cancer Maternal Grandfather   . CAD Other        GF in his 30s  . Irritable bowel syndrome Other   . Colitis Other   . Heart disease Other   . Prostate cancer Neg Hx     Social History Social History   Tobacco Use  . Smoking status: Former Smoker    Packs/day: 0.50    Types: Cigarettes    Start date: 2007    Quit date: 10/04/2008    Years since quitting: 12.3  . Smokeless tobacco: Never Used  Vaping Use  . Vaping Use: Never used  Substance Use Topics  . Alcohol use: No    Alcohol/week: 0.0 standard drinks    Comment: rare  . Drug use: No     Allergies   Patient has no known allergies.   Review of Systems Review of Systems Pertinent negatives listed in HPI   Physical Exam Triage Vital Signs ED Triage Vitals  Enc Vitals Group     BP 01/19/21 0838 120/83     Pulse Rate 01/19/21 0838 81     Resp 01/19/21 0838 17     Temp 01/19/21 0838 98.3 F (36.8 C)     Temp Source 01/19/21 0838 Oral     SpO2 01/19/21 0838 97 %     Weight --      Height --      Head Circumference --      Peak Flow --      Pain Score 01/19/21 0836 0     Pain Loc --      Pain Edu? --      Excl. in [place]? --    No data found.  Updated Vital Signs BP 120/83 (BP Location: Left Arm)   Pulse 81   Temp 98.3 F (36.8 C) (Oral)   Resp 17   SpO2 97%   Visual Acuity Right Eye Distance:   Left Eye Distance:   Bilateral Distance:    Right Eye Near:   Left Eye Near:    Bilateral Near:     Physical Exam Constitutional:      Appearance: He is obese.  HENT:     Head: Normocephalic and  atraumatic.     Nose:  Nose normal.  Cardiovascular:     Rate and Rhythm: Normal rate.  Pulmonary:     Effort: Pulmonary effort is normal.     Breath sounds: Normal breath sounds.  Abdominal:     General: There is distension.     Tenderness: There is no guarding or rebound.  Skin:    General: Skin is warm.     Capillary Refill: Capillary refill takes less than 2 seconds.  Neurological:     General: No focal deficit present.     Mental Status: He is oriented to person, place, and time.  Psychiatric:        Mood and Affect: Mood normal.      UC Treatments / Results  Labs (all labs ordered are listed, but only abnormal results are displayed) Labs Reviewed - No data to display  EKG   Radiology No results found.  Procedures Procedures (including critical care time)  Medications Ordered in UC Medications  alum & mag hydroxide-simeth (MAALOX/MYLANTA) 200-200-20 MG/5ML suspension 30 mL (30 mLs Oral Given 01/19/21 0912)    And  lidocaine (XYLOCAINE) 2 % viscous mouth solution 15 mL (15 mLs Oral Given 01/19/21 0909)    Initial Impression / Assessment and Plan / UC Course  I have reviewed the triage vital signs and the nursing notes.  Pertinent labs & imaging results that were available during my care of the patient were reviewed by me and considered in my medical decision making (see chart for details).    Patient presents today with acute GI bloating and loose stools patient is currently on azithromycin and completing the remaining 2 doses.  Symptoms began upon starting the first dose of azithromycin which is a known side effect of taking an antibiotic.  Given patient's underlying GI history, antibiotics likely have exacerbated an acute flare.  GI cocktail given here in clinic.  Patient advised to resume use of his Reglan at home while currently having symptoms.  He is also currently taking a PPI he is going to continue that as well.  Encouraged to hydrate well with fluids.   Blood pressure is stable at present.  No history of hypertension however patient can continue to monitor at home advised that during periods of acutely not feeling well blood pressure can be acutely elevated.  Reassuring that current readings here in office are consistent with prior PCP readings.  Follow-up with primary care provider as needed. Final Clinical Impressions(s) / UC Diagnoses   Final diagnoses:  Abdominal bloating  Loose stools  Elevated BP without diagnosis of hypertension     Discharge Instructions     Resume Reglan acutely to see if while completing antibiotics GI symptom improve. You were given Go cocktail-lidocaine and Malox for abdominal cramping. You can purchase Maloox over the counter if your symptoms improve.  Follow-up with PCP as needed. Blood pressure is normal today.    ED Prescriptions    None     PDMP not reviewed this encounter.   Scot Jun, FNP 01/19/21 1036

## 2021-01-19 NOTE — Discharge Instructions (Addendum)
Resume Reglan acutely to see if while completing antibiotics GI symptom improve. You were given Go cocktail-lidocaine and Malox for abdominal cramping. You can purchase Maloox over the counter if your symptoms improve.  Follow-up with PCP as needed. Blood pressure is normal today.

## 2021-01-19 NOTE — ED Triage Notes (Signed)
Pt presents with concern of elevated blood pressure readings this am. States BP was 148/108 and 140/90 this am. Also states still having Drainage and GI upset with antibiotics.  Denies headache, blurred vision, dizziness, chest pain or SOB.

## 2021-01-25 ENCOUNTER — Other Ambulatory Visit: Payer: Self-pay | Admitting: Gastroenterology

## 2021-02-04 ENCOUNTER — Encounter: Payer: Self-pay | Admitting: Internal Medicine

## 2021-02-04 ENCOUNTER — Other Ambulatory Visit: Payer: Self-pay

## 2021-02-04 ENCOUNTER — Ambulatory Visit (INDEPENDENT_AMBULATORY_CARE_PROVIDER_SITE_OTHER): Payer: 59 | Admitting: Internal Medicine

## 2021-02-04 VITALS — BP 128/84 | HR 84 | Temp 98.4°F | Resp 16 | Ht 71.5 in | Wt 247.2 lb

## 2021-02-04 DIAGNOSIS — R5383 Other fatigue: Secondary | ICD-10-CM

## 2021-02-04 DIAGNOSIS — K589 Irritable bowel syndrome without diarrhea: Secondary | ICD-10-CM | POA: Diagnosis not present

## 2021-02-04 DIAGNOSIS — E119 Type 2 diabetes mellitus without complications: Secondary | ICD-10-CM

## 2021-02-04 LAB — CBC WITH DIFFERENTIAL/PLATELET
Basophils Absolute: 0 10*3/uL (ref 0.0–0.1)
Basophils Relative: 0.5 % (ref 0.0–3.0)
Eosinophils Absolute: 0.1 10*3/uL (ref 0.0–0.7)
Eosinophils Relative: 1.1 % (ref 0.0–5.0)
HCT: 43.9 % (ref 39.0–52.0)
Hemoglobin: 14.8 g/dL (ref 13.0–17.0)
Lymphocytes Relative: 20.4 % (ref 12.0–46.0)
Lymphs Abs: 2 10*3/uL (ref 0.7–4.0)
MCHC: 33.7 g/dL (ref 30.0–36.0)
MCV: 89.7 fl (ref 78.0–100.0)
Monocytes Absolute: 0.7 10*3/uL (ref 0.1–1.0)
Monocytes Relative: 6.7 % (ref 3.0–12.0)
Neutro Abs: 7 10*3/uL (ref 1.4–7.7)
Neutrophils Relative %: 71.3 % (ref 43.0–77.0)
Platelets: 317 10*3/uL (ref 150.0–400.0)
RBC: 4.9 Mil/uL (ref 4.22–5.81)
RDW: 14.1 % (ref 11.5–15.5)
WBC: 9.8 10*3/uL (ref 4.0–10.5)

## 2021-02-04 LAB — TSH: TSH: 1.4 u[IU]/mL (ref 0.35–4.50)

## 2021-02-04 LAB — B12 AND FOLATE PANEL
Folate: >23.6 ng/mL (ref >5.9)
Vitamin B-12: 684 pg/mL (ref 211–911)

## 2021-02-04 NOTE — Patient Instructions (Addendum)
Restart taking probiotics  Increase Reglan to 3 times a day before meals  Take hyoscyamine as needed for stomach discomfort    GO TO THE LAB : Get the blood work     You have an appointment with me for July, call sooner if needed

## 2021-02-04 NOTE — Progress Notes (Signed)
Subjective:    Patient ID: Wayne Melton, male    DOB: [birth year], 50 y.o.   MRN: [id number]  DOS:  02/04/2021 Type of visit - description: Acute visit Went to the urgent care twice, notes reviewed. He is here because he continue to have some symptoms.  Urgent care on 01/16/2021, seen with URI type of symptoms, COVID test negative, Rx Zithromax  Urgent care visit again 01/19/2021, had abdominal bloating and loose stool in the context of taking Zithromax. Was given GI cocktail and recommend to resume Reglan.   At this point he continue feeling bloated, burping excessively. Denies fever, occasionally has night sweats (not a new symptom) No heartburn per se No nausea or vomiting Has chronic diarrhea, worse after he took a Z-Pak, still has a mix of normal and loose stools.  No blood in the stools.   He also feels fatigue, more than usual. Denies chest pain, difficulty breathing or lower extremity edema.  Occasionally has palpitations.  Review of Systems See above   Past Medical History:  Diagnosis Date  . Allergic rhinitis 07/05/2012  . Constipation    chronic  . Diabetes mellitus without complication (HCC)    No meds  . Gastroparesis   . GERD , HH, h/o PUD 07/05/2012  . Hyperlipidemia   . IBS (irritable bowel syndrome) 11/06/2012  . Obesity   . OSA (obstructive sleep apnea)    mild  . Seasonal allergies   . Seasonal allergies   . Sleep apnea     Past Surgical History:  Procedure Laterality Date  . COLONOSCOPY    . UPPER GASTROINTESTINAL ENDOSCOPY      Allergies as of 02/04/2021   No Known Allergies     Medication List       Accurate as of Feb 04, 2021 11:59 PM. If you have any questions, ask your nurse or doctor.        STOP taking these medications   azithromycin 250 MG tablet Commonly known as: ZITHROMAX Stopped by: Kathlene November, MD     TAKE these medications   acetaminophen 500 MG tablet Commonly known as: TYLENOL Take 1,000 mg by mouth every 6 (six)  hours as needed for moderate pain.   ALLEGRA ALLERGY PO Take by mouth.   atorvastatin 20 MG tablet Commonly known as: LIPITOR Take 1 tablet (20 mg total) by mouth at bedtime.   empagliflozin 10 MG Tabs tablet Commonly known as: Jardiance Take 1 tablet (10 mg total) by mouth daily before breakfast.   fluticasone 50 MCG/ACT nasal spray Commonly known as: FLONASE Place into the nose.   Hyoscyamine Sulfate SL 0.125 MG Subl Commonly known as: Levsin/SL Place 0.125 mg under the tongue every 4 (four) hours as needed (abd pain).   metFORMIN 750 MG 24 hr tablet Commonly known as: GLUCOPHAGE-XR Take 2 tablets (1,500 mg total) by mouth daily with breakfast.   metoCLOPramide 5 MG tablet Commonly known as: REGLAN TAKE 1 TABLET BY MOUTH 2 TO 3 TIMES A DAY AS NEEDED   omeprazole 40 MG capsule Commonly known as: PRILOSEC Take 1 capsule (40 mg total) by mouth 2 (two) times daily.   ondansetron 4 MG disintegrating tablet Commonly known as: Zofran ODT Take 1 tablet (4 mg total) by mouth every 6 (six) hours as needed for nausea or vomiting.   polyethylene glycol 17 g packet Commonly known as: MiraLax Take 17 g by mouth daily.   Qvar RediHaler 80 MCG/ACT inhaler Generic drug: beclomethasone Inhale 2 puffs  into the lungs daily.   triamcinolone 55 MCG/ACT Aero nasal inhaler Commonly known as: NASACORT          Objective:   Physical Exam BP 128/84 (BP Location: Left Arm, Patient Position: Sitting, Cuff Size: Normal)   Pulse 84   Temp 98.4 F (36.9 C) (Oral)   Resp 16   Ht 5' 11.5" (1.816 m)   Wt 247 lb 4 oz (112.2 kg)   SpO2 97%   BMI 34.00 kg/m  General:   Well developed, NAD, BMI noted.  HEENT:  Normocephalic . Face symmetric, atraumatic Lungs:  CTA B Normal respiratory effort, no intercostal retractions, no accessory muscle use. Heart: RRR,  no murmur.  Abdomen:  Not distended, soft, non-tender. No rebound or rigidity.   Skin: Not pale. Not jaundice Lower  extremities: no pretibial edema bilaterally  Neurologic:  alert & oriented X3.  Speech normal, gait appropriate for age and unassisted Psych--  Cognition and judgment appear intact.  Cooperative with normal attention span and concentration.  Behavior appropriate. No anxious or depressed appearing.     Assessment       Assessment DM Hyperlipidemia Bronchospasm (on Qvar) GI: --h/o PUD (EGD @ [facility]  2002: neg  but reports had another EGD with ulcer) --GERD, IBS, occ constipation, gastroparesis (+ gastric emptying study October 2020) -- fatty liver per u/s 06-2015 -EGD 08-2017: Gastritis, polyps, benign findings per GI letter, no follow-up OSA:  (-)  home sleep apnea test 2014,  Full sleep study~08-2015 : mild OSA, saw pulmonary, rx dental appliance, wt loss HST 08-2018: Rx CPAP - DOE x years>>>>  neg stress test 03/2015 Abnormal brain MRI 05-2020 (subsequently saw neurosurgery, recommend observation, per patient, no documentation)  PLAN DM: Was changed to metformin extended release, that helped his chronic diarrhea.  Now has some diarrhea since he took antibiotics. Also started Jardiance based on the last A1c. IBS: GI symptoms increase after he had a URI and was rx abx Stools are loose or normal, has increased bloating. No fever, no watery stools, no blood in the stools. Of note this, with extended release metformin his chronic diarrhea definitely improved. Plan: Probiotics, increase Reglan to 3 times daily, observation otherwise.  Call if not better Fatigue: Without other symptoms, reports good compliance with CPAP.  Exam is benign. Plan: Check TSH, check B12 (patient on metformin) reassess in few months. RTC July 2022 already scheduled    This visit occurred during the SARS-CoV-2 public health emergency.  Safety protocols were in place, including screening questions prior to the visit, additional usage of staff PPE, and extensive cleaning of exam room while observing  appropriate contact time as indicated for disinfecting solutions.

## 2021-02-05 NOTE — Assessment & Plan Note (Signed)
DM: Was changed to metformin extended release, that helped his chronic diarrhea.  Now has some diarrhea since he took antibiotics. Also started Jardiance based on the last A1c. IBS: GI symptoms increase after he had a URI and was rx abx Stools are loose or normal, has increased bloating. No fever, no watery stools, no blood in the stools. Of note this, with extended release metformin his chronic diarrhea definitely improved. Plan: Probiotics, increase Reglan to 3 times daily, observation otherwise.  Call if not better Fatigue: Without other symptoms, reports good compliance with CPAP.  Exam is benign. Plan: Check TSH, check B12 (patient on metformin) reassess in few months. RTC July 2022 already scheduled

## 2021-02-24 LAB — HM DIABETES EYE EXAM

## 2021-03-08 ENCOUNTER — Other Ambulatory Visit: Payer: Self-pay | Admitting: Internal Medicine

## 2021-03-28 ENCOUNTER — Other Ambulatory Visit: Payer: Self-pay | Admitting: Internal Medicine

## 2021-03-31 ENCOUNTER — Other Ambulatory Visit: Payer: Self-pay | Admitting: Gastroenterology

## 2021-04-10 ENCOUNTER — Ambulatory Visit
Admission: EM | Admit: 2021-04-10 | Discharge: 2021-04-10 | Disposition: A | Discharge status: Home / Self Care | Payer: 59 | Attending: Internal Medicine | Admitting: Internal Medicine

## 2021-04-10 ENCOUNTER — Other Ambulatory Visit: Payer: Self-pay

## 2021-04-10 DIAGNOSIS — R110 Nausea: Secondary | ICD-10-CM | POA: Diagnosis present

## 2021-04-10 DIAGNOSIS — A084 Viral intestinal infection, unspecified: Secondary | ICD-10-CM | POA: Insufficient documentation

## 2021-04-10 DIAGNOSIS — R1084 Generalized abdominal pain: Secondary | ICD-10-CM | POA: Diagnosis present

## 2021-04-10 LAB — POCT RAPID STREP A (OFFICE): Rapid Strep A Screen: NEGATIVE

## 2021-04-10 NOTE — ED Provider Notes (Signed)
[facility]    CSN: [id number] Arrival date & time: 04/10/21  1306      History   Chief Complaint Chief Complaint  Patient presents with   Generalized Body Aches   Abdominal Cramping    HPI Wayne Melton is a 50 y.o. male.   Patient presents with 4-day history of chills, body aches, fatigue, abdominal pain.  Patient denies any known fevers at home.  Patient describes abdominal pain as "rocks in his stomach" that is present across mid abdomen.  Patient had headache and left ear pain at onset of symptoms but denies the symptoms now.  Patient has had intermittent nausea but denies any diarrhea.  Denies constipation.  Denies urinary burning or frequency.  Patient states that wife had same symptoms a couple of days ago.  Patient has IBS and has been taking medication regularly.  Patient states that this abdominal pain seems different than his normal IBS abdominal cramping.  Denies any upper respiratory symptoms.  Patient has been able to drink and eat regularly.   Abdominal Cramping   Past Medical History:  Diagnosis Date   Allergic rhinitis 07/05/2012   Constipation    chronic   Diabetes mellitus without complication ([place])    No meds   Gastroparesis    GERD , HH, h/o PUD 07/05/2012   Hyperlipidemia    IBS (irritable bowel syndrome) 11/06/2012   Obesity    OSA (obstructive sleep apnea)    mild   Seasonal allergies    Seasonal allergies    Sleep apnea     Patient Active Problem List   Diagnosis Date Noted   Cerebral aneurysm 05/14/2020   Hyperlipidemia associated with type 2 diabetes mellitus ([place]) 04/19/2020   Gastroparesis 12/18/2019   DOE (dyspnea on exertion) 08/23/2019   BMI 35.0-35.9,adult 09/15/2018   OSA (obstructive sleep apnea) 10/14/2015   Follow-up ---------------PCP NOTES 06/12/2015   Diabetes mellitus without complication ([place]) 41/96/2229   Fatigue 11/08/2014   Annual physical exam 11/06/2012   IBS (irritable bowel syndrome) 11/06/2012    GERD , HH, h/o PUD 07/05/2012   Allergic rhinitis 07/05/2012   Hyperlipemia 07/05/2012    Past Surgical History:  Procedure Laterality Date   COLONOSCOPY     UPPER GASTROINTESTINAL ENDOSCOPY         Home Medications    Prior to Admission medications   Medication Sig Start Date End Date Taking? Authorizing Provider  acetaminophen (TYLENOL) 500 MG tablet Take 1,000 mg by mouth every 6 (six) hours as needed for moderate pain.    [provider]  atorvastatin (LIPITOR) 20 MG tablet Take 1 tablet (20 mg total) by mouth at bedtime. 03/09/21   Colon Branch, MD  beclomethasone (QVAR REDIHALER) 80 MCG/ACT inhaler Inhale 2 puffs into the lungs daily. 07/16/20   Colon Branch, MD  empagliflozin (JARDIANCE) 10 MG TABS tablet Take 1 tablet (10 mg total) by mouth daily before breakfast. 03/30/21   Colon Branch, MD  Fexofenadine HCl [facility] ALLERGY PO) Take by mouth.    [provider]  fluticasone (FLONASE) 50 MCG/ACT nasal spray Place into the nose.    [provider]  Hyoscyamine Sulfate SL (LEVSIN/SL) 0.125 MG SUBL Place 0.125 mg under the tongue every 4 (four) hours as needed (abd pain). 03/14/20   Ok Edwards, PA-C  metFORMIN (GLUCOPHAGE-XR) 750 MG 24 hr tablet Take 2 tablets (1,500 mg total) by mouth daily with breakfast. 12/19/20   Colon Branch, MD  metoCLOPramide (  REGLAN) 5 MG tablet TAKE 1 TABLET BY MOUTH 2 TO 3 TIMES A DAY AS NEEDED 03/31/21   Armbruster, Carlota Raspberry, MD  omeprazole (PRILOSEC) 40 MG capsule Take 1 capsule (40 mg total) by mouth 2 (two) times daily. 12/08/20   Armbruster, Carlota Raspberry, MD  ondansetron (ZOFRAN ODT) 4 MG disintegrating tablet Take 1 tablet (4 mg total) by mouth every 6 (six) hours as needed for nausea or vomiting. 03/14/20   [name], [name], PA-C  polyethylene glycol (MIRALAX) 17 g packet Take 17 g by mouth daily. 07/18/19   Armbruster, Carlota Raspberry, MD  triamcinolone (NASACORT) 30 MCG/ACT AERO nasal inhaler  09/03/20   [provider]    Family  History Family History  Problem Relation Age of Onset   Pancreatitis Mother    Diabetes Mother    Hyperlipidemia Mother    Colon polyps Mother        late 31s   Mitral valve prolapse Mother    Colon cancer Maternal Uncle        uncle in his 70s   Epilepsy Father    Crohn's disease Brother    Colon cancer Maternal Grandfather    CAD Other        GF in his 48s   Irritable bowel syndrome Other    Colitis Other    Heart disease Other    Prostate cancer Neg Hx     Social History Social History   Tobacco Use   Smoking status: Former    Packs/day: 0.50    Pack years: 0.00    Types: Cigarettes    Start date: 2007    Quit date: 10/04/2008    Years since quitting: 12.5   Smokeless tobacco: Never  Vaping Use   Vaping Use: Never used  Substance Use Topics   Alcohol use: No    Alcohol/week: 0.0 standard drinks    Comment: rare   Drug use: No     Allergies   Patient has no known allergies.   Review of Systems Review of Systems Per HPI  Physical Exam Triage Vital Signs ED Triage Vitals  Enc Vitals Group     BP 04/10/21 1504 126/86     Pulse Rate 04/10/21 1504 89     Resp 04/10/21 1504 18     Temp 04/10/21 1504 98.6 F (37 C)     Temp Source 04/10/21 1504 Oral     SpO2 04/10/21 1504 96 %     Weight --      Height --      Head Circumference --      Peak Flow --      Pain Score 04/10/21 1508 3     Pain Loc --      Pain Edu? --      Excl. in [place]? --    No data found.  Updated Vital Signs BP 126/86 (BP Location: Left Arm)   Pulse 89   Temp 98.6 F (37 C) (Oral)   Resp 18   SpO2 96%   Visual Acuity Right Eye Distance:   Left Eye Distance:   Bilateral Distance:    Right Eye Near:   Left Eye Near:    Bilateral Near:     Physical Exam Constitutional:      General: He is not in acute distress.    Appearance: Normal appearance.  HENT:     Head: Normocephalic and atraumatic.     Right Ear: Tympanic membrane and ear canal normal.  Left Ear:  Tympanic membrane and ear canal normal.     Nose: Nose normal.     Mouth/Throat:     Mouth: Mucous membranes are dry.     Pharynx: Posterior oropharyngeal erythema present.  Eyes:     Extraocular Movements: Extraocular movements intact.     Conjunctiva/sclera: Conjunctivae normal.  Cardiovascular:     Rate and Rhythm: Normal rate and regular rhythm.     Pulses: Normal pulses.     Heart sounds: Normal heart sounds.  Pulmonary:     Effort: Pulmonary effort is normal. No respiratory distress.     Breath sounds: Normal breath sounds. No wheezing.  Abdominal:     General: Abdomen is flat. Bowel sounds are normal. There is no distension.     Palpations: Abdomen is soft.     Tenderness: There is no abdominal tenderness. There is no guarding. Negative signs include Murphy's sign and McBurney's sign.     Hernia: No hernia is present.  Skin:    General: Skin is warm and dry.  Neurological:     General: No focal deficit present.     Mental Status: He is alert and oriented to person, place, and time. Mental status is at baseline.  Psychiatric:        Mood and Affect: Mood normal.        Behavior: Behavior normal.        Thought Content: Thought content normal.        Judgment: Judgment normal.     UC Treatments / Results  Labs (all labs ordered are listed, but only abnormal results are displayed) Labs Reviewed  CULTURE, GROUP A STREP ([place])  NOVEL CORONAVIRUS, NAA  POCT RAPID STREP A (OFFICE)    EKG   Radiology No results found.  Procedures Procedures (including critical care time)  Medications Ordered in UC Medications - No data to display  Initial Impression / Assessment and Plan / UC Course  I have reviewed the triage vital signs and the nursing notes.  Pertinent labs & imaging results that were available during my care of the patient were reviewed by me and considered in my medical decision making (see chart for details).     Clinical symptoms and exam consistent  with viral enteritis.  Patient encouraged to increase oral fluids.  Offered ondansetron as needed for nausea but patient declined.  Posterior pharynx erythematous on exam.  Suspicious for possible strep throat.  Rapid strep test was negative in office.  Throat culture pending.  COVID-19 viral swab pending.  Discussed with patient that symptoms should resolve in the next few days.  Patient to follow-up with PCP or urgent care if symptoms do not resolve or if they worsen.  Patient advised to go to the hospital if abdominal pain significantly worsens. Discussed strict return precautions. Patient verbalized understanding and is agreeable with plan.  Final Clinical Impressions(s) / UC Diagnoses   Final diagnoses:  Viral enteritis  Nausea without vomiting  Generalized abdominal pain     Discharge Instructions      It is likely that you have a viral stomach virus.  Please increase oral fluids for hydration.  COVID-19 viral swab is pending.  Rapid strep test was negative in office.  Throat culture is pending.  We will call if any of these test results are positive.  Please go to the hospital if abdominal pain or symptoms significantly worsen.  Please follow-up with PCP or urgent care if symptoms do not resolve.  ED Prescriptions   None    PDMP not reviewed this encounter.   Odis Luster, [place] 04/10/21 [phone]

## 2021-04-10 NOTE — Discharge Instructions (Addendum)
It is likely that you have a viral stomach virus.  Please increase oral fluids for hydration.  COVID-19 viral swab is pending.  Rapid strep test was negative in office.  Throat culture is pending.  We will call if any of these test results are positive.  Please go to the hospital if abdominal pain or symptoms significantly worsen.  Please follow-up with PCP or urgent care if symptoms do not resolve.

## 2021-04-10 NOTE — ED Triage Notes (Signed)
Four day h/o chills, body aches, fatigue and abdominal cramping. At the onset Pt reports HA and left ear pain that has since resolved. Confirms bloating and nausea. Abdominal pain is waking him up at night. No emesis, diarrhea and constipation. Appetite is decreased. No urinary sxs.  Pt has a h/o IBS, gastroparesis and GERD.

## 2021-04-11 LAB — NOVEL CORONAVIRUS, NAA: SARS-CoV-2, NAA: NOT DETECTED

## 2021-04-11 LAB — SARS-COV-2, NAA 2 DAY TAT

## 2021-04-13 LAB — CULTURE, GROUP A STREP (THRC)

## 2021-04-14 ENCOUNTER — Emergency Department (HOSPITAL_BASED_OUTPATIENT_CLINIC_OR_DEPARTMENT_OTHER)
Admission: EM | Admit: 2021-04-14 | Discharge: 2021-04-14 | Disposition: A | Discharge status: Home / Self Care | Payer: 59 | Attending: Emergency Medicine | Admitting: Emergency Medicine

## 2021-04-14 ENCOUNTER — Encounter (HOSPITAL_BASED_OUTPATIENT_CLINIC_OR_DEPARTMENT_OTHER): Payer: Self-pay

## 2021-04-14 ENCOUNTER — Emergency Department (HOSPITAL_BASED_OUTPATIENT_CLINIC_OR_DEPARTMENT_OTHER): Payer: 59

## 2021-04-14 ENCOUNTER — Other Ambulatory Visit: Payer: Self-pay

## 2021-04-14 DIAGNOSIS — E785 Hyperlipidemia, unspecified: Secondary | ICD-10-CM | POA: Diagnosis not present

## 2021-04-14 DIAGNOSIS — Z7984 Long term (current) use of oral hypoglycemic drugs: Secondary | ICD-10-CM | POA: Diagnosis not present

## 2021-04-14 DIAGNOSIS — Z87891 Personal history of nicotine dependence: Secondary | ICD-10-CM | POA: Diagnosis not present

## 2021-04-14 DIAGNOSIS — R1013 Epigastric pain: Secondary | ICD-10-CM | POA: Insufficient documentation

## 2021-04-14 DIAGNOSIS — R109 Unspecified abdominal pain: Secondary | ICD-10-CM | POA: Diagnosis present

## 2021-04-14 DIAGNOSIS — E1169 Type 2 diabetes mellitus with other specified complication: Secondary | ICD-10-CM | POA: Diagnosis not present

## 2021-04-14 DIAGNOSIS — K219 Gastro-esophageal reflux disease without esophagitis: Secondary | ICD-10-CM | POA: Diagnosis not present

## 2021-04-14 HISTORY — DX: Fatty (change of) liver, not elsewhere classified: K76.0

## 2021-04-14 LAB — URINALYSIS, ROUTINE W REFLEX MICROSCOPIC
Bilirubin Urine: NEGATIVE
Glucose, UA: >=500 mg/dL — AB
Hgb urine dipstick: NEGATIVE
Ketones, ur: NEGATIVE mg/dL
Leukocytes,Ua: NEGATIVE
Nitrite: NEGATIVE
Protein, ur: NEGATIVE mg/dL
Specific Gravity, Urine: <1.005 — ABNORMAL LOW (ref 1.005–1.030)
pH: 6.5 (ref 5.0–8.0)

## 2021-04-14 LAB — CBC
HCT: 44.5 % (ref 39.0–52.0)
Hemoglobin: 14.9 g/dL (ref 13.0–17.0)
MCH: 30.2 pg (ref 26.0–34.0)
MCHC: 33.5 g/dL (ref 30.0–36.0)
MCV: 90.1 fL (ref 80.0–100.0)
Platelets: 313 10*3/uL (ref 150–400)
RBC: 4.94 MIL/uL (ref 4.22–5.81)
RDW: 12.4 % (ref 11.5–15.5)
WBC: 8.8 10*3/uL (ref 4.0–10.5)
nRBC: 0 % (ref 0.0–0.2)

## 2021-04-14 LAB — COMPREHENSIVE METABOLIC PANEL
ALT: 38 U/L (ref 0–44)
AST: 35 U/L (ref 15–41)
Albumin: 4.2 g/dL (ref 3.5–5.0)
Alkaline Phosphatase: 84 U/L (ref 38–126)
Anion gap: 9 (ref 5–15)
BUN: 11 mg/dL (ref 6–20)
CO2: 27 mmol/L (ref 22–32)
Calcium: 9.2 mg/dL (ref 8.9–10.3)
Chloride: 102 mmol/L (ref 98–111)
Creatinine, Ser: 0.99 mg/dL (ref 0.61–1.24)
GFR, Estimated: >60 mL/min (ref >60)
Glucose, Bld: 116 mg/dL — ABNORMAL HIGH (ref 70–99)
Potassium: 3.5 mmol/L (ref 3.5–5.1)
Sodium: 138 mmol/L (ref 135–145)
Total Bilirubin: 0.7 mg/dL (ref 0.3–1.2)
Total Protein: 7.4 g/dL (ref 6.5–8.1)

## 2021-04-14 LAB — URINALYSIS, MICROSCOPIC (REFLEX)

## 2021-04-14 LAB — TROPONIN I (HIGH SENSITIVITY): Troponin I (High Sensitivity): 3 ng/L (ref <18)

## 2021-04-14 LAB — LIPASE, BLOOD: Lipase: 34 U/L (ref 11–51)

## 2021-04-14 MED ORDER — ONDANSETRON 4 MG PO TBDP
8.0000 mg | ORAL_TABLET | Freq: Once | ORAL | Status: AC
  Administered 2021-04-14: 8 mg via ORAL
  Filled 2021-04-14: qty 2

## 2021-04-14 MED ORDER — IOHEXOL 300 MG/ML  SOLN
100.0000 mL | Freq: Once | INTRAMUSCULAR | Status: AC | PRN
  Administered 2021-04-14: 100 mL via INTRAVENOUS

## 2021-04-14 MED ORDER — DICYCLOMINE HCL 10 MG PO CAPS
10.0000 mg | ORAL_CAPSULE | Freq: Once | ORAL | Status: AC
  Administered 2021-04-14: 10 mg via ORAL
  Filled 2021-04-14: qty 1

## 2021-04-14 NOTE — ED Provider Notes (Signed)
[facility] Provider Note   CSN: [id number] Arrival date & time: 04/14/21  0155     History Chief Complaint  Patient presents with   Abdominal Pain    Wayne Melton is a 50 y.o. male.  50 year old male the presents emerged from today with about a weeks worth of intermittent episodes of chills and abdominal pain seems like it is mostly at night but is also sometimes associated with eating during the day.  Patient states that he has a history of IBS feels it does not feel similar to that.  Does not really have any other GI changes sometimes is nausea but not persistently no diarrhea that is persistent but does feel he is probably constipated with harder than normal stools.   Abdominal Pain     Past Medical History:  Diagnosis Date   Allergic rhinitis 07/05/2012   Constipation    chronic   Diabetes mellitus without complication ([place])    No meds   Fatty liver disease, nonalcoholic    Gastroparesis    GERD , HH, h/o PUD 07/05/2012   Hyperlipidemia    IBS (irritable bowel syndrome) 11/06/2012   Obesity    OSA (obstructive sleep apnea)    mild   Seasonal allergies    Seasonal allergies    Sleep apnea     Patient Active Problem List   Diagnosis Date Noted   Cerebral aneurysm 05/14/2020   Hyperlipidemia associated with type 2 diabetes mellitus ([place]) 04/19/2020   Gastroparesis 12/18/2019   DOE (dyspnea on exertion) 08/23/2019   BMI 35.0-35.9,adult 09/15/2018   OSA (obstructive sleep apnea) 10/14/2015   Follow-up ---------------PCP NOTES 06/12/2015   Diabetes mellitus without complication ([place]) 89/38/1017   Fatigue 11/08/2014   Annual physical exam 11/06/2012   IBS (irritable bowel syndrome) 11/06/2012   GERD , HH, h/o PUD 07/05/2012   Allergic rhinitis 07/05/2012   Hyperlipemia 07/05/2012    Past Surgical History:  Procedure Laterality Date   COLONOSCOPY     UPPER GASTROINTESTINAL ENDOSCOPY         Family History  Problem  Relation Age of Onset   Pancreatitis Mother    Diabetes Mother    Hyperlipidemia Mother    Colon polyps Mother        late 23s   Mitral valve prolapse Mother    Colon cancer Maternal Uncle        uncle in his 80s   Epilepsy Father    Crohn's disease Brother    Colon cancer Maternal Grandfather    CAD Other        GF in his 46s   Irritable bowel syndrome Other    Colitis Other    Heart disease Other    Prostate cancer Neg Hx     Social History   Tobacco Use   Smoking status: Former    Packs/day: 0.50    Pack years: 0.00    Types: Cigarettes    Start date: 2007    Quit date: 10/04/2008    Years since quitting: 12.5   Smokeless tobacco: Never  Vaping Use   Vaping Use: Never used  Substance Use Topics   Alcohol use: Yes    Comment: rare   Drug use: Never    Home Medications Prior to Admission medications   Medication Sig Start Date End Date Taking? Authorizing Provider  acetaminophen (TYLENOL) 500 MG tablet Take 1,000 mg by mouth every 6 (six) hours as needed for moderate pain.  [provider]  atorvastatin (LIPITOR) 20 MG tablet Take 1 tablet (20 mg total) by mouth at bedtime. 03/09/21   Colon Branch, MD  beclomethasone (QVAR REDIHALER) 80 MCG/ACT inhaler Inhale 2 puffs into the lungs daily. 07/16/20   Colon Branch, MD  empagliflozin (JARDIANCE) 10 MG TABS tablet Take 1 tablet (10 mg total) by mouth daily before breakfast. 03/30/21   Colon Branch, MD  Fexofenadine HCl [facility] ALLERGY PO) Take by mouth.    [provider]  fluticasone (FLONASE) 50 MCG/ACT nasal spray Place into the nose.    [provider]  Hyoscyamine Sulfate SL (LEVSIN/SL) 0.125 MG SUBL Place 0.125 mg under the tongue every 4 (four) hours as needed (abd pain). 03/14/20   Ok Edwards, PA-C  metFORMIN (GLUCOPHAGE-XR) 750 MG 24 hr tablet Take 2 tablets (1,500 mg total) by mouth daily with breakfast. 12/19/20   Colon Branch, MD  metoCLOPramide (REGLAN) 5 MG tablet TAKE 1 TABLET BY MOUTH 2  TO 3 TIMES A DAY AS NEEDED 03/31/21   Armbruster, Carlota Raspberry, MD  omeprazole (PRILOSEC) 40 MG capsule Take 1 capsule (40 mg total) by mouth 2 (two) times daily. 12/08/20   Armbruster, Carlota Raspberry, MD  ondansetron (ZOFRAN ODT) 4 MG disintegrating tablet Take 1 tablet (4 mg total) by mouth every 6 (six) hours as needed for nausea or vomiting. 03/14/20   [name], [name], PA-C  polyethylene glycol (MIRALAX) 17 g packet Take 17 g by mouth daily. 07/18/19   Armbruster, Carlota Raspberry, MD  triamcinolone (NASACORT) 55 MCG/ACT AERO nasal inhaler  09/03/20   [provider]    Allergies    Patient has no known allergies.  Review of Systems   Review of Systems  Gastrointestinal:  Positive for abdominal pain.  All other systems reviewed and are negative.  Physical Exam Updated Vital Signs BP 138/84 (BP Location: Right Arm)   Pulse 74   Temp 98.4 F (36.9 C) (Oral)   Resp 18   Ht 6' (1.829 m)   Wt 112 kg   SpO2 97%   BMI 33.50 kg/m   Physical Exam Vitals and nursing note reviewed.  Constitutional:      Appearance: He is well-developed.  HENT:     Head: Normocephalic and atraumatic.     Mouth/Throat:     Mouth: Mucous membranes are moist.     Pharynx: Oropharynx is clear.  Eyes:     Pupils: Pupils are equal, round, and reactive to light.  Cardiovascular:     Rate and Rhythm: Normal rate.  Pulmonary:     Effort: Pulmonary effort is normal. No respiratory distress.  Abdominal:     General: There is no distension.     Palpations: Abdomen is soft.     Tenderness: There is no abdominal tenderness.  Musculoskeletal:        General: Normal range of motion.     Cervical back: Normal range of motion.  Skin:    General: Skin is warm and dry.  Neurological:     General: No focal deficit present.     Mental Status: He is alert and oriented to person, place, and time.    ED Results / Procedures / Treatments   Labs (all labs ordered are listed, but only abnormal results are displayed) Labs  Reviewed  COMPREHENSIVE METABOLIC PANEL - Abnormal; Notable for the following components:      Result Value   Glucose, Bld 116 (*)    All  other components within normal limits  URINALYSIS, ROUTINE W REFLEX MICROSCOPIC - Abnormal; Notable for the following components:   Specific Gravity, Urine <1.005 (*)    Glucose, UA >=500 (*)    All other components within normal limits  URINALYSIS, MICROSCOPIC (REFLEX) - Abnormal; Notable for the following components:   Bacteria, UA RARE (*)    All other components within normal limits  LIPASE, BLOOD  CBC  TROPONIN I (HIGH SENSITIVITY)    EKG None  Radiology CT ABDOMEN PELVIS W CONTRAST  Result Date: 04/14/2021 CLINICAL DATA:  Chills and abdominal pain for 1 week EXAM: CT ABDOMEN AND PELVIS WITH CONTRAST TECHNIQUE: Multidetector CT imaging of the abdomen and pelvis was performed using the standard protocol following bolus administration of intravenous contrast. CONTRAST:  144mL OMNIPAQUE IOHEXOL 300 MG/ML  SOLN COMPARISON:  CT 11/04/2018 FINDINGS: Lower chest: Atelectatic changes versus scarring in the lingula and right middle lobe. Lung bases otherwise clear. Normal heart size. No pericardial effusion. Hepatobiliary: Diffuse hepatic hypoattenuation compatible with hepatic steatosis. Sparing along the gallbladder fossa. No concerning focal liver lesion. Smooth liver surface contour. Normal gallbladder and biliary tree without visible calcified gallstone or biliary ductal dilatation. Pancreas: Partial fatty replacement of the pancreas. No pancreatic ductal dilatation or surrounding inflammatory changes. Spleen: Normal in size. No concerning splenic lesions. Adrenals/Urinary Tract: Normal adrenals. Kidneys are normally located with symmetric enhancementand excretion. Few tiny subcentimeter hypoattenuating foci in both kidneys too small to fully characterize on CT imaging but statistically likely benign. No suspicious renal lesion, urolithiasis or  hydronephrosis. Urinary bladder is unremarkable for the degree of distention. Stomach/Bowel: Distal esophagus, stomach and duodenum are unremarkable. Normal duodenal sweep across the midline abdomen. No conspicuous large or small bowel thickening or dilatation. No evidence of bowel obstruction. Normal appendix in the right lower quadrant. Vascular/Lymphatic: No significant vascular findings are present. No enlarged abdominal or pelvic lymph nodes. Reproductive: The prostate and seminal vesicles are unremarkable. Other: No abdominopelvic free fluid or free gas. No bowel containing hernias. Musculoskeletal: No acute osseous abnormality or suspicious osseous lesion. Focal discogenic change noted L5-S1, similar to prior. IMPRESSION: 1. No acute abdominopelvic process to provide cause for patient's symptoms. 2. Hepatic steatosis. Electronically Signed   By: Lovena Le M.D.   On: 04/14/2021 03:48    Procedures Procedures   Medications Ordered in ED Medications  dicyclomine (BENTYL) capsule 10 mg (10 mg Oral Given 04/14/21 0228)  ondansetron (ZOFRAN-ODT) disintegrating tablet 8 mg (8 mg Oral Given 04/14/21 0228)  iohexol (OMNIPAQUE) 300 MG/ML solution 100 mL (100 mLs Intravenous Contrast Given 04/14/21 0326)    ED Course  I have reviewed the triage vital signs and the nursing notes.  Pertinent labs & imaging results that were available during my care of the patient were reviewed by me and considered in my medical decision making (see chart for details).    MDM Rules/Calculators/A&P                          Workup unremarkable. Unclear etiology but doesn't seem c/w acute emergency at this time. Stable for discharge with pcp/GI follow up as already scheduled.   Final Clinical Impression(s) / ED Diagnoses Final diagnoses:  Epigastric pain    Rx / DC Orders ED Discharge Orders     None        Ryshawn Sanzone, Corene Cornea, MD 04/14/21 [phone]

## 2021-04-14 NOTE — ED Triage Notes (Signed)
Pt c/o waking up in the middle of the night with chills and abd pain x 1 week. Pt was seen at UC earlier in the week for same, but they did not do any labs or prescribe him any medication. Pt states he did have a negative COVID test during that visit.

## 2021-04-20 ENCOUNTER — Other Ambulatory Visit: Payer: Self-pay

## 2021-04-20 ENCOUNTER — Ambulatory Visit (INDEPENDENT_AMBULATORY_CARE_PROVIDER_SITE_OTHER): Payer: 59 | Admitting: Internal Medicine

## 2021-04-20 VITALS — BP 108/75 | HR 71 | Temp 98.1°F | Resp 18 | Ht 71.0 in | Wt 243.4 lb

## 2021-04-20 DIAGNOSIS — E119 Type 2 diabetes mellitus without complications: Secondary | ICD-10-CM | POA: Diagnosis not present

## 2021-04-20 DIAGNOSIS — G4733 Obstructive sleep apnea (adult) (pediatric): Secondary | ICD-10-CM

## 2021-04-20 DIAGNOSIS — Z1159 Encounter for screening for other viral diseases: Secondary | ICD-10-CM | POA: Diagnosis not present

## 2021-04-20 DIAGNOSIS — E785 Hyperlipidemia, unspecified: Secondary | ICD-10-CM

## 2021-04-20 DIAGNOSIS — K589 Irritable bowel syndrome without diarrhea: Secondary | ICD-10-CM

## 2021-04-20 NOTE — Patient Instructions (Signed)
   GO TO THE LAB : Get the blood work     GO TO THE FRONT DESK, [place] Come back for   a physical exam in 4 months

## 2021-04-20 NOTE — Progress Notes (Signed)
Subjective:    Patient ID: Wayne Melton, male    DOB: Jun 19, 1971, 50 y.o.   MRN: [id number]  DOS:  04/20/2021 Type of visit - description: f/u Today with talk about diabetes, high cholesterol, IBS. Went to the ER,  notes, labs and x-rays reviewed.  At this point, GI symptoms are at baseline, essentially mild chronic diarrhea  Review of Systems Denies chest pain or difficulty breathing. No cough or sputum production  Past Medical History:  Diagnosis Date   Allergic rhinitis 07/05/2012   Constipation    chronic   Diabetes mellitus without complication ([place])    No meds   Fatty liver disease, nonalcoholic    Gastroparesis    GERD , HH, h/o PUD 07/05/2012   Hyperlipidemia    IBS (irritable bowel syndrome) 11/06/2012   Obesity    OSA (obstructive sleep apnea)    mild   Seasonal allergies    Seasonal allergies    Sleep apnea     Past Surgical History:  Procedure Laterality Date   COLONOSCOPY     UPPER GASTROINTESTINAL ENDOSCOPY      Allergies as of 04/20/2021   No Known Allergies      Medication List        Accurate as of April 20, 2021  3:36 PM. If you have any questions, ask your nurse or doctor.          acetaminophen 500 MG tablet Commonly known as: TYLENOL Take 1,000 mg by mouth every 6 (six) hours as needed for moderate pain.   ALLEGRA ALLERGY PO Take by mouth.   atorvastatin 20 MG tablet Commonly known as: LIPITOR Take 1 tablet (20 mg total) by mouth at bedtime.   empagliflozin 10 MG Tabs tablet Commonly known as: Jardiance Take 1 tablet (10 mg total) by mouth daily before breakfast.   fluticasone 50 MCG/ACT nasal spray Commonly known as: FLONASE Place into the nose.   Hyoscyamine Sulfate SL 0.125 MG Subl Commonly known as: Levsin/SL Place 0.125 mg under the tongue every 4 (four) hours as needed (abd pain).   metFORMIN 750 MG 24 hr tablet Commonly known as: GLUCOPHAGE-XR Take 2 tablets (1,500 mg total) by mouth daily with  breakfast.   metoCLOPramide 5 MG tablet Commonly known as: REGLAN TAKE 1 TABLET BY MOUTH 2 TO 3 TIMES A DAY AS NEEDED   omeprazole 40 MG capsule Commonly known as: PRILOSEC Take 1 capsule (40 mg total) by mouth 2 (two) times daily.   ondansetron 4 MG disintegrating tablet Commonly known as: Zofran ODT Take 1 tablet (4 mg total) by mouth every 6 (six) hours as needed for nausea or vomiting.   polyethylene glycol 17 g packet Commonly known as: MiraLax Take 17 g by mouth daily.   Qvar RediHaler 80 MCG/ACT inhaler Generic drug: beclomethasone Inhale 2 puffs into the lungs daily.   triamcinolone 55 MCG/ACT Aero nasal inhaler Commonly known as: NASACORT           Objective:   Physical Exam BP 108/75 (BP Location: Left Arm, Patient Position: Sitting, Cuff Size: Normal)   Pulse 71   Temp 98.1 F (36.7 C) (Oral)   Resp 18   Ht 5\' 11"  (1.803 m)   Wt 243 lb 6.4 oz (110.4 kg)   SpO2 99%   BMI 33.95 kg/m  General:   Well developed, NAD, BMI noted. HEENT:  Normocephalic . Face symmetric, atraumatic Lungs:  CTA B Normal respiratory effort, no intercostal retractions, no accessory muscle use.  Heart: RRR,  no murmur.  DM foot exam: No edema, good pedal pulses, pinprick examination normal Skin: Not pale. Not jaundice Neurologic:  alert & oriented X3.  Speech normal, gait appropriate for age and unassisted Psych--  Cognition and judgment appear intact.  Cooperative with normal attention span and concentration.  Behavior appropriate. No anxious or depressed appearing.      Assessment       Assessment DM Hyperlipidemia Bronchospasm (on Qvar) GI: --h/o PUD (EGD @ [facility]  2002: neg  but reports had another EGD with ulcer) --GERD, IBS, occ constipation, gastroparesis (+ gastric emptying study October 2020) -- fatty liver per u/s 06-2015 -EGD 08-2017: Gastritis, polyps, benign findings per GI letter, no follow-up OSA:  (-)  home sleep apnea test 2014,  Full sleep  study~08-2015 : mild OSA, saw pulmonary, rx dental appliance, wt loss HST 08-2018: Rx CPAP - DOE x years>>>>  neg stress test 03/2015 Abnormal brain MRI 05-2020 (subsequently saw neurosurgery, recommend observation, per patient, no documentation)  PLAN DM: Foot exam normal, continue Jardiance, metformin, check A1c and micro.  Patient education: Diet and exercise discussed. High cholesterol: On Lipitor, not fasting, check a lipid panel IBS: Went to the ER 04/14/2021: Several weeks history of chills, abdominal pain, work-up including: CMP, CBC, lipase, urinalysis: All okay. CT abdomen and pelvis: No acute, hepatic steatosis. At this point, he is only having some abdominal discomfort and chronic diarrhea. MiraLAX is on his list, he uses very infrequently when he feels "full".  Advised to minimize or stop the use of MiraLAX since he has chronic diarrhea.  Denies constipation per se. OSA: Reports good CPAP compliance Preventive care: Hep C screening RTC 4 months CPX  This visit occurred during the SARS-CoV-2 public health emergency.  Safety protocols were in place, including screening questions prior to the visit, additional usage of staff PPE, and extensive cleaning of exam room while observing appropriate contact time as indicated for disinfecting solutions.

## 2021-04-21 LAB — HEMOGLOBIN A1C: Hgb A1c MFr Bld: 6.6 % — ABNORMAL HIGH (ref 4.6–6.5)

## 2021-04-21 LAB — LIPID PANEL
Cholesterol: 135 mg/dL (ref 0–200)
HDL: 36 mg/dL — ABNORMAL LOW (ref >39.00)
NonHDL: 98.88
Total CHOL/HDL Ratio: 4
Triglycerides: 313 mg/dL — ABNORMAL HIGH (ref 0.0–149.0)
VLDL: 62.6 mg/dL — ABNORMAL HIGH (ref 0.0–40.0)

## 2021-04-21 LAB — MICROALBUMIN / CREATININE URINE RATIO
Creatinine,U: 32.2 mg/dL
Microalb Creat Ratio: 2.2 mg/g (ref 0.0–30.0)
Microalb, Ur: <0.7 mg/dL (ref 0.0–1.9)

## 2021-04-21 LAB — LDL CHOLESTEROL, DIRECT: Direct LDL: 69 mg/dL

## 2021-04-21 LAB — HEPATITIS C ANTIBODY
Hepatitis C Ab: NONREACTIVE
SIGNAL TO CUT-OFF: 0.01 (ref <1.00)

## 2021-04-21 NOTE — Assessment & Plan Note (Signed)
DM: Foot exam normal, continue Jardiance, metformin, check A1c and micro.  Patient education: Diet and exercise discussed. High cholesterol: On Lipitor, not fasting, check a lipid panel IBS: Went to the ER 04/14/2021: Several weeks history of chills, abdominal pain, work-up including: CMP, CBC, lipase, urinalysis: All okay. CT abdomen and pelvis: No acute, hepatic steatosis. At this point, he is only having some abdominal discomfort and chronic diarrhea. MiraLAX is on his list, he uses very infrequently when he feels "full".  Advised to minimize or stop the use of MiraLAX since he has chronic diarrhea.  Denies constipation per se. OSA: Reports good CPAP compliance Preventive care: Hep C screening RTC 4 months CPX

## 2021-05-19 ENCOUNTER — Encounter: Payer: Self-pay | Admitting: Gastroenterology

## 2021-05-25 ENCOUNTER — Other Ambulatory Visit: Payer: Self-pay | Admitting: Gastroenterology

## 2021-05-28 ENCOUNTER — Encounter: Payer: Self-pay | Admitting: Gastroenterology

## 2021-05-28 ENCOUNTER — Ambulatory Visit (INDEPENDENT_AMBULATORY_CARE_PROVIDER_SITE_OTHER): Payer: 59 | Admitting: Gastroenterology

## 2021-05-28 VITALS — BP 110/70 | HR 78 | Ht 72.0 in | Wt 240.0 lb

## 2021-05-28 DIAGNOSIS — K760 Fatty (change of) liver, not elsewhere classified: Secondary | ICD-10-CM | POA: Diagnosis not present

## 2021-05-28 DIAGNOSIS — Z86010 Personal history of colonic polyps: Secondary | ICD-10-CM

## 2021-05-28 DIAGNOSIS — K219 Gastro-esophageal reflux disease without esophagitis: Secondary | ICD-10-CM

## 2021-05-28 DIAGNOSIS — R140 Abdominal distension (gaseous): Secondary | ICD-10-CM | POA: Diagnosis not present

## 2021-05-28 DIAGNOSIS — K3184 Gastroparesis: Secondary | ICD-10-CM

## 2021-05-28 MED ORDER — SUTAB 1479-225-188 MG PO TABS: 1.0000 | ORAL_TABLET | Freq: Once | ORAL | 0 refills | Status: AC

## 2021-05-28 NOTE — Progress Notes (Signed)
HPI :  50 year old male here for a follow-up visit for reflux and gastroparesis, history of colon polyps.  See prior notes for details of his case.  He has had a prior EGD for the symptoms and CT scans of the abdomen pelvis, and right upper quadrant ultrasound.  He had a gastric emptying study consistent with significant gastroparesis in November 2020.  His current regimen is Reglan 5 mg 3 times daily as well as omeprazole 40 mg twice daily.  Generally he states his reflux has been well controlled on the regimen.  He previously was on Dexilant for control of his reflux but due to cost has been on omeprazole and that is generally working pretty well for him.  He does think the Reglan helps, if he does not take it he has some worsening symptoms, but he still has some early satiety.  Main symptom that bothers him is abdominal distention and bloating with gas.  This can occur both his upper abdomen and lower abdomen.  His bowels go back and forth between loose stools and constipated stools at times.  He states he actually had a episode of severe abdominal pain in July and had a CT scan in the ER on July 12.  This did not show any concerning pathology, noted to have hepatic steatosis which he has known about.  His liver enzymes have been normal.  He does not drink any alcohol.  He does take metformin and has been on the extended release form, the other form caused loose stools.  He has been working on weight loss and has lost about 20 pounds over the last year which she is happy about.  He denies any cardiopulmonary symptoms.  He denies any blood in her stools.  He has tried low FODMAP diet in the past. He had a colonoscopy in March 2017 with one small adenoma.  Recall that his mother had colon polyps at age 78 and he has 2 second-degree relatives with colon cancer, and his brother has Crohn's disease.  He is requesting to proceed with surveillance colonoscopy in this light.     Prior workup: EGD 08/16/2017  - few small linear based ulcers, normal esophagus, multiple small gastric polyps, - benign biopsies Colonoscopy 12/09/15 - 35m transverse TA, otherwise normal exam     [place]oreaabdomen 10/23/18 - diffuse hepatic steatosis, Two hypoechoic liver masses which were not definitely seen on previous study, but are suspicious for areas of benign focal fatty sparing.   CT abdomen / pelvis - 11/04/18 - IMPRESSION: 1.  No acute intra-abdominal process. 2. Hepatic steatosis.   GES 08/09/19 -  Delayed gastric emptying study.  Findings suggest gastroparesis.  CT scan abdomen / pelvis with contrast 04/14/21: IMPRESSION: 1. No acute abdominopelvic process to provide cause for patient's symptoms. 2. Hepatic steatosis.     Past Medical History:  Diagnosis Date   Allergic rhinitis 07/05/2012   Constipation    chronic   Diabetes mellitus without complication ([facility])    No meds   Fatty liver disease, nonalcoholic    Gastroparesis    GERD , HH, h/o PUD 07/05/2012   Hyperlipidemia    IBS (irritable bowel syndrome) 11/06/2012   Obesity    OSA (obstructive sleep apnea)    mild   Seasonal allergies    Seasonal allergies    Sleep apnea      Past Surgical History:  Procedure Laterality Date   COLONOSCOPY     UPPER GASTROINTESTINAL ENDOSCOPY  Family History  Problem Relation Age of Onset   Pancreatitis Mother    Diabetes Mother    Hyperlipidemia Mother    Colon polyps Mother        late 37s   Mitral valve prolapse Mother    Colon cancer Maternal Uncle        uncle in his 77s   Epilepsy Father    Crohn's disease Brother    Colon cancer Maternal Grandfather    CAD Other        GF in his 55s   Irritable bowel syndrome Other    Colitis Other    Heart disease Other    Prostate cancer Neg Hx    Social History   Tobacco Use   Smoking status: Former    Packs/day: 0.50    Types: Cigarettes    Start date: 2007    Quit date: 10/04/2008    Years since quitting: 12.6   Smokeless tobacco: Never   Vaping Use   Vaping Use: Never used  Substance Use Topics   Alcohol use: Yes    Comment: rare   Drug use: Never   Current Outpatient Medications  Medication Sig Dispense Refill   acetaminophen (TYLENOL) 500 MG tablet Take 1,000 mg by mouth every 6 (six) hours as needed for moderate pain.     atorvastatin (LIPITOR) 20 MG tablet Take 1 tablet (20 mg total) by mouth at bedtime. 90 tablet 1   beclomethasone (QVAR REDIHALER) 80 MCG/ACT inhaler Inhale 2 puffs into the lungs daily. 10.6 g 5   empagliflozin (JARDIANCE) 10 MG TABS tablet Take 1 tablet (10 mg total) by mouth daily before breakfast. 90 tablet 0   Fexofenadine HCl (ALLEGRA ALLERGY PO) Take by mouth.     fluticasone (FLONASE) 50 MCG/ACT nasal spray Place into the nose.     Hyoscyamine Sulfate SL (LEVSIN/SL) 0.125 MG SUBL Place 0.125 mg under the tongue every 4 (four) hours as needed (abd pain). 20 tablet 0   metFORMIN (GLUCOPHAGE-XR) 750 MG 24 hr tablet Take 2 tablets (1,500 mg total) by mouth daily with breakfast. 60 tablet 6   metoCLOPramide (REGLAN) 5 MG tablet TAKE 1 TABLET BY MOUTH 2 TO 3 TIMES A DAY AS NEEDED 60 tablet 2   omeprazole (PRILOSEC) 40 MG capsule Take 1 capsule (40 mg total) by mouth 2 (two) times daily. 180 capsule 1   ondansetron (ZOFRAN ODT) 4 MG disintegrating tablet Take 1 tablet (4 mg total) by mouth every 6 (six) hours as needed for nausea or vomiting. 20 tablet 0   polyethylene glycol (MIRALAX) 17 g packet Take 17 g by mouth daily. 14 each 0   triamcinolone (NASACORT) 55 MCG/ACT AERO nasal inhaler      No current facility-administered medications for this visit.   No Known Allergies   Review of Systems: All systems reviewed and negative except where noted in HPI.   Lab Results  Component Value Date   WBC 8.8 04/14/2021   HGB 14.9 04/14/2021   HCT 44.5 04/14/2021   MCV 90.1 04/14/2021   PLT 313 04/14/2021    Lab Results  Component Value Date   ALT 38 04/14/2021   AST 35 04/14/2021   ALKPHOS  84 04/14/2021   BILITOT 0.7 04/14/2021    Lab Results  Component Value Date   CREATININE 0.99 04/14/2021   BUN 11 04/14/2021   NA 138 04/14/2021   K 3.5 04/14/2021   CL 102 04/14/2021   CO2 27 04/14/2021  Physical Exam: BP 110/70   Pulse 78   Ht 6' (1.829 m)   Wt 240 lb (108.9 kg)   BMI 32.55 kg/m  Constitutional: Pleasant,well-developed, male in no acute distress. Neurological: Alert and oriented to person place and time. Psychiatric: Normal mood and affect. Behavior is normal.   ASSESSMENT AND PLAN: 50 year old male here for reassessment of following:  Gastroparesis GERD Bloating Fatty liver History of colon polyps  Discussed these issues with him as above.  Generally doing okay on twice daily PPI and Reglan.  Counseled him he should take some drug holidays from Reglan over time, he is agreeable to that.  He does think it helps him however.  Has required higher dose PPI to control symptoms, we have discussed risks of this in the past and he wished to continue with it.  Abdominal bloating is his main concern.  We discussed intestinal gas and bloating.  He has tried low FODMAP diet in the past.  I think a trial of rifaximin may be reasonable for this.  He is interested in trying this however is due for surveillance colonoscopy.  His mother had polyps at a very young age, he has multiple second-degree relatives with colon cancer, and had a polyp 5 years ago, I think surveillance colonoscopy is reasonable at this time.  After discussion of risks and benefits he wants to proceed.  Otherwise, we discussed his fatty liver disease.  His liver enzymes are normal.  He has been working on weight loss and lost 20 pounds over the past year.  This is good.  He understands risk for cirrhosis moving forward and will need to be monitored over time.  Plan: - schedule Colonoscopy   - continue Reglan, take drug holiday periodically as outlined - continue PPI - consideration for trial of  Rifaximin - wants hold off until the colonoscopy is done - counseled on fatty liver, continue with weight loss, check LFTs at least yearly  Jolly Mango, MD [facility]

## 2021-05-28 NOTE — Patient Instructions (Signed)
If you are age 48 or older, your body mass index should be between 23-30. Your Body mass index is 32.55 kg/m. If this is out of the aforementioned range listed, please consider follow up with your Primary Care Provider.  If you are age 56 or younger, your body mass index should be between 19-25. Your Body mass index is 32.55 kg/m. If this is out of the aformentioned range listed, please consider follow up with your Primary Care Provider.   __________________________________________________________  The [facility] providers would like to encourage you to use [facility] to communicate with providers for non-urgent requests or questions.  Due to long hold times on the telephone, sending your provider a message by [facility] may be a faster and more efficient way to get a response.  Please allow 48 business hours for a response.  Please remember that this is for non-urgent requests.   You have been scheduled for a colonoscopy. Please follow written instructions given to you at your visit today.  Please pick up your prep supplies at the pharmacy within the next 1-3 days. If you use inhalers (even only as needed), please bring them with you on the day of your procedure.   Continue Reglan  Continue PPI  Thank you for entrusting me with your care and for choosing [facility], Dr. [place] Cellar

## 2021-06-07 ENCOUNTER — Encounter: Payer: Self-pay | Admitting: Internal Medicine

## 2021-06-09 ENCOUNTER — Telehealth (INDEPENDENT_AMBULATORY_CARE_PROVIDER_SITE_OTHER): Payer: 59 | Admitting: Internal Medicine

## 2021-06-09 VITALS — BP 123/83 | HR 85 | Temp 97.8°F | Ht 72.0 in | Wt 245.0 lb

## 2021-06-09 DIAGNOSIS — U071 COVID-19: Secondary | ICD-10-CM

## 2021-06-09 NOTE — Progress Notes (Signed)
Subjective:    Patient ID: Wayne Melton, male    DOB: 06-Sep-1971, 50 y.o.   MRN: [id number]  DOS:  06/09/2021 Type of visit - description: Virtual Visit via Video Note  I connected with the above patient  by a video enabled telemedicine application and verified that I am speaking with the correct person using two identifiers.   THIS ENCOUNTER IS A VIRTUAL VISIT DUE TO COVID-19 - PATIENT WAS NOT SEEN IN THE OFFICE. PATIENT HAS CONSENTED TO VIRTUAL VISIT / TELEMEDICINE VISIT   Location of patient: home  Location of provider: office  Persons participating in the virtual visit: patient, provider   I discussed the limitations of evaluation and management by telemedicine and the availability of in person appointments. The patient expressed understanding and agreed to proceed.  Acute Symptoms started 1 week ago: Postnasal dripping, nasal congestion, then developed cough and myalgias. Another family member had similar symptoms. He has been tested twice with at home test and a PCR and both came back positive. He did have diarrhea for couple of days, that is largely gone.  He denies nausea vomiting No chest pain no difficulty breathing. No rash or headache.    Review of Systems See above   Past Medical History:  Diagnosis Date   Allergic rhinitis 07/05/2012   Constipation    chronic   Diabetes mellitus without complication ([place])    No meds   Fatty liver disease, nonalcoholic    Gastroparesis    GERD , HH, h/o PUD 07/05/2012   Hyperlipidemia    IBS (irritable bowel syndrome) 11/06/2012   Obesity    OSA (obstructive sleep apnea)    mild   Seasonal allergies    Seasonal allergies    Sleep apnea     Past Surgical History:  Procedure Laterality Date   COLONOSCOPY     UPPER GASTROINTESTINAL ENDOSCOPY      Allergies as of 06/09/2021   No Known Allergies      Medication List        Accurate as of June 09, 2021  4:06 PM. If you have any questions, ask your  nurse or doctor.          acetaminophen 500 MG tablet Commonly known as: TYLENOL Take 1,000 mg by mouth every 6 (six) hours as needed for moderate pain.   ALLEGRA ALLERGY PO Take by mouth.   atorvastatin 20 MG tablet Commonly known as: LIPITOR Take 1 tablet (20 mg total) by mouth at bedtime.   empagliflozin 10 MG Tabs tablet Commonly known as: Jardiance Take 1 tablet (10 mg total) by mouth daily before breakfast.   fluticasone 50 MCG/ACT nasal spray Commonly known as: FLONASE Place into the nose.   Hyoscyamine Sulfate SL 0.125 MG Subl Commonly known as: Levsin/SL Place 0.125 mg under the tongue every 4 (four) hours as needed (abd pain).   metFORMIN 750 MG 24 hr tablet Commonly known as: GLUCOPHAGE-XR Take 2 tablets (1,500 mg total) by mouth daily with breakfast.   metoCLOPramide 5 MG tablet Commonly known as: REGLAN TAKE 1 TABLET BY MOUTH 2 TO 3 TIMES A DAY AS NEEDED   omeprazole 40 MG capsule Commonly known as: PRILOSEC Take 1 capsule (40 mg total) by mouth 2 (two) times daily.   ondansetron 4 MG disintegrating tablet Commonly known as: Zofran ODT Take 1 tablet (4 mg total) by mouth every 6 (six) hours as needed for nausea or vomiting.   polyethylene glycol 17 g packet Commonly known  as: MiraLax Take 17 g by mouth daily.   Qvar RediHaler 80 MCG/ACT inhaler Generic drug: beclomethasone Inhale 2 puffs into the lungs daily.   triamcinolone 55 MCG/ACT Aero nasal inhaler Commonly known as: NASACORT           Objective:   Physical Exam BP 123/83   Pulse 85   Temp 97.8 F (36.6 C)   Ht 6' (1.829 m)   Wt 245 lb (111.1 kg)   SpO2 97%   BMI 33.23 kg/m  This is a virtual video visit, alert oriented x3, in no distress.  Sitting comfortably in his chair.     Assessment      Assessment DM Hyperlipidemia Bronchospasm (on Qvar) GI: --h/o PUD (EGD @ [facility]  2002: neg  but reports had another EGD with ulcer) --GERD, IBS, occ constipation, gastroparesis  (+ gastric emptying study October 2020) -- fatty liver per u/s 06-2015 -EGD 08-2017: Gastritis, polyps, benign findings per GI letter, no follow-up OSA:  (-)  home sleep apnea test 2014,  Full sleep study~08-2015 : mild OSA, saw pulmonary, rx dental appliance, wt loss HST 08-2018: Rx CPAP - DOE x years>>>>  neg stress test 03/2015 Abnormal brain MRI 05-2020 (subsequently saw neurosurgery, recommend observation, per patient, no documentation)  PLAN COVID-19: Symptoms a started a week ago, +  COVID test x 2, he is triple vaccinated. Explained patient that he is outside the window of oral antivirals however I expect he will continue recuperating. Did recommend plenty of fluids and call if the gradual improvement does not continue.   Recent labs or medications reviewed.  All okay. I answered all his questions to his satisfaction. Recommend a  booster in 4 weeks     Time spent with the patient: 15 minutes, going over what to expect from this point on while he is out of the window of opportunity to be treated. All questions answered to his satisfaction.  I discussed the assessment and treatment plan with the patient. The patient was provided an opportunity to ask questions and all were answered. The patient agreed with the plan and demonstrated an understanding of the instructions.   The patient was advised to call back or seek an in-person evaluation if the symptoms worsen or if the condition fails to improve as anticipated.

## 2021-06-10 ENCOUNTER — Other Ambulatory Visit: Payer: Self-pay | Admitting: Gastroenterology

## 2021-06-10 NOTE — Assessment & Plan Note (Signed)
COVID-19: Symptoms a started a week ago, +  COVID test x 2, he is triple vaccinated. Explained patient that he is outside the window of oral antivirals however I expect he will continue recuperating. Did recommend plenty of fluids and call if the gradual improvement does not continue.   Recent labs or medications reviewed.  All okay. I answered all his questions to his satisfaction. Recommend a  booster in 4 weeks

## 2021-06-13 ENCOUNTER — Other Ambulatory Visit: Payer: Self-pay

## 2021-06-13 ENCOUNTER — Ambulatory Visit
Admission: EM | Admit: 2021-06-13 | Discharge: 2021-06-13 | Disposition: A | Discharge status: Home / Self Care | Payer: 59 | Attending: Family Medicine | Admitting: Family Medicine

## 2021-06-13 DIAGNOSIS — U071 COVID-19: Secondary | ICD-10-CM | POA: Diagnosis not present

## 2021-06-13 DIAGNOSIS — J0100 Acute maxillary sinusitis, unspecified: Secondary | ICD-10-CM

## 2021-06-13 MED ORDER — AMOXICILLIN 875 MG PO TABS: 875.0000 mg | ORAL_TABLET | Freq: Two times a day (BID) | ORAL | 0 refills | Status: AC

## 2021-06-13 NOTE — ED Provider Notes (Signed)
[place]   KT:[id number] 06/13/21 Arrival Time: 0807  ASSESSMENT & PLAN:  1. COVID-19 virus infection   2. Acute non-recurrent maxillary sinusitis    OTC symptom care as needed. Begin: Meds ordered this encounter  Medications   amoxicillin (AMOXIL) 875 MG tablet    Sig: Take 1 tablet (875 mg total) by mouth 2 (two) times daily for 10 days.    Dispense:  20 tablet    Refill:  0    Follow-up Information     Colon Branch, MD.   Specialty: Internal Medicine Why: As needed. Contact information: [address] [place] [address] [phone]                 Reviewed expectations re: course of current medical issues. Questions answered. Outlined signs and symptoms indicating need for more acute intervention. Understanding verbalized. After Visit Summary given.   SUBJECTIVE: History from: patient. Wayne Melton is a 50 y.o. male who reports COVID + 06/02/21. URI symptoms; better but past few days with new onset maxillary sinus pressure/pain. Afebrile. Ears feel full. Mild cough but improving; no SOB. Normal PO intake without n/v/d.   OBJECTIVE:  Vitals:   06/13/21 0819  BP: (!) 123/94  Pulse: 85  Resp: 12  Temp: 98.6 F (37 C)  TempSrc: Oral  SpO2: 97%    General appearance: alert; no distress Eyes: PERRLA; EOMI; conjunctiva normal HENT: [place]; AT; with mild nasal congestion; reports maxillary sinus pressure; throat cobblestoning Neck: supple  Lungs: speaks full sentences without difficulty; unlabored; CTAB Extremities: no edema Skin: warm and dry Neurologic: normal gait Psychological: alert and cooperative; normal mood and affect  No Known Allergies  Past Medical History:  Diagnosis Date   Allergic rhinitis 07/05/2012   Constipation    chronic   Diabetes mellitus without complication (HCC)    No meds   Fatty liver disease, nonalcoholic    Gastroparesis    GERD , HH, h/o PUD 07/05/2012   Hyperlipidemia    IBS  (irritable bowel syndrome) 11/06/2012   Obesity    OSA (obstructive sleep apnea)    mild   Seasonal allergies    Seasonal allergies    Sleep apnea    Social History   Socioeconomic History   Marital status: Married    Spouse name: Not on file   Number of children: 3   Years of education: Not on file   Highest education level: Not on file  Occupational History   Occupation: busines Administrator, computers  Tobacco Use   Smoking status: Former    Packs/day: 0.50    Types: Cigarettes    Start date: 2007    Quit date: 10/04/2008    Years since quitting: 12.6   Smokeless tobacco: Never  Vaping Use   Vaping Use: Never used  Substance and Sexual Activity   Alcohol use: Yes    Comment: rare   Drug use: Never   Sexual activity: Not on file  Other Topics Concern   Not on file  Social History Narrative   Household: pt, wife,    2001, 2007, 2008       Social Determinants of Radio broadcast assistant Strain: Not on [employer] Insecurity: Not on file  Transportation Needs: Not on file  Physical Activity: Not on file  Stress: Not on file  Social Connections: Not on file  Intimate Partner Violence: Not on file   Family History  Problem Relation Age  of Onset   Pancreatitis Mother    Diabetes Mother    Hyperlipidemia Mother    Colon polyps Mother        late 53s   Mitral valve prolapse Mother    Colon cancer Maternal Uncle        uncle in his 49s   Epilepsy Father    Crohn's disease Brother    Colon cancer Maternal Grandfather    CAD Other        GF in his 70s   Irritable bowel syndrome Other    Colitis Other    Heart disease Other    Prostate cancer Neg Hx    Past Surgical History:  Procedure Laterality Date   COLONOSCOPY     UPPER GASTROINTESTINAL ENDOSCOPY       Vanessa Kick, MD 06/13/21 [phone]

## 2021-06-13 NOTE — ED Triage Notes (Signed)
Pt c/o sinus congestion and drainage with associated cough Onset aug 30. States last Saturday he tested PCR COVID(+) after returning from [facility] state. Says his symptoms resolved except for fatigue last Tuesday. Then Thursday sinus symptoms returns and he is concerned about a sinus infection.

## 2021-06-24 ENCOUNTER — Other Ambulatory Visit: Payer: Self-pay | Admitting: Internal Medicine

## 2021-07-03 ENCOUNTER — Other Ambulatory Visit: Payer: Self-pay | Admitting: Internal Medicine

## 2021-07-06 ENCOUNTER — Encounter: Payer: Self-pay | Admitting: Gastroenterology

## 2021-07-06 ENCOUNTER — Ambulatory Visit (AMBULATORY_SURGERY_CENTER): Payer: 59 | Admitting: Gastroenterology

## 2021-07-06 ENCOUNTER — Other Ambulatory Visit: Payer: Self-pay

## 2021-07-06 VITALS — BP 130/78 | HR 69 | Temp 98.0°F | Resp 16 | Ht 72.0 in | Wt 240.0 lb

## 2021-07-06 DIAGNOSIS — Z86010 Personal history of colonic polyps: Secondary | ICD-10-CM

## 2021-07-06 DIAGNOSIS — D123 Benign neoplasm of transverse colon: Secondary | ICD-10-CM

## 2021-07-06 DIAGNOSIS — Z800 Family history of malignant neoplasm of digestive organs: Secondary | ICD-10-CM | POA: Diagnosis not present

## 2021-07-06 MED ORDER — SODIUM CHLORIDE 0.9 % IV SOLN
500.0000 mL | Freq: Once | INTRAVENOUS | Status: DC
Start: 2021-07-06 — End: 2021-07-06

## 2021-07-06 NOTE — Progress Notes (Signed)
[facility] History and Physical   Primary Care Physician:  Colon Branch, MD   Reason for Procedure:   History of polyps, multiple relatives with colon cancer  Plan:    colonoscopy     HPI: Wayne Melton is a 50 y.o. male  here for colonoscopy surveillance - adenoma removed age 77, 5 years ago. Has ongoing bloating but no diarrhea, etc.. Mother had polyps at age 72s and multiple second degree relatives with colon cancer. Otherwise feels well without any cardiopulmonary symptoms.    Past Medical History:  Diagnosis Date   Allergic rhinitis 07/05/2012   Constipation    chronic   Diabetes mellitus without complication (HCC)    No meds   Fatty liver disease, nonalcoholic    Gastroparesis    GERD , HH, h/o PUD 07/05/2012   Hyperlipidemia    IBS (irritable bowel syndrome) 11/06/2012   Obesity    OSA (obstructive sleep apnea)    mild   Seasonal allergies    Seasonal allergies    Sleep apnea     Past Surgical History:  Procedure Laterality Date   COLONOSCOPY     UPPER GASTROINTESTINAL ENDOSCOPY      Prior to Admission medications   Medication Sig Start Date End Date Taking? Authorizing Provider  acetaminophen (TYLENOL) 500 MG tablet Take 1,000 mg by mouth every 6 (six) hours as needed for moderate pain.   Yes [provider]  atorvastatin (LIPITOR) 20 MG tablet Take 1 tablet (20 mg total) by mouth at bedtime. 03/09/21  Yes Paz, Alda Berthold, MD  beclomethasone (QVAR REDIHALER) 80 MCG/ACT inhaler INHALE 2 PUFFS BY MOUTH INTO THE LUNGS DAILY 07/06/21  Yes Paz, Jacqulyn Bath E, MD  empagliflozin (JARDIANCE) 10 MG TABS tablet TAKE 1 TABLET BY MOUTH DAILY BEFORE BREAKFAST. 07/06/21  Yes Paz, Alda Berthold, MD  Fexofenadine HCl [facility] ALLERGY PO) Take by mouth.   Yes [provider]  fluticasone (FLONASE) 50 MCG/ACT nasal spray Place into the nose.   Yes [provider]  metFORMIN (GLUCOPHAGE-XR) 750 MG 24 hr tablet TAKE 2 TABLETS (1,500 MG TOTAL) BY MOUTH DAILY  WITH BREAKFAST. 06/24/21  Yes Paz, Jacqulyn Bath E, MD  metoCLOPramide (REGLAN) 5 MG tablet TAKE 1 TABLET BY MOUTH 2 TO 3 TIMES A DAY AS NEEDED 05/25/21  Yes Raymont Andreoni, Carlota Raspberry, MD  omeprazole (PRILOSEC) 40 MG capsule TAKE 1 CAPSULE BY MOUTH TWICE A DAY 06/10/21  Yes Theus Espin, Carlota Raspberry, MD  Hyoscyamine Sulfate SL (LEVSIN/SL) 0.125 MG SUBL Place 0.125 mg under the tongue every 4 (four) hours as needed (abd pain). 03/14/20   [name], [name], PA-C  ondansetron (ZOFRAN ODT) 4 MG disintegrating tablet Take 1 tablet (4 mg total) by mouth every 6 (six) hours as needed for nausea or vomiting. 03/14/20   [name], [name], PA-C  polyethylene glycol (MIRALAX) 17 g packet Take 17 g by mouth daily. Patient not taking: No sig reported 07/18/19   Yetta Flock, MD  triamcinolone (NASACORT) 55 MCG/ACT AERO nasal inhaler  09/03/20   [provider]    Current Outpatient Medications  Medication Sig Dispense Refill   acetaminophen (TYLENOL) 500 MG tablet Take 1,000 mg by mouth every 6 (six) hours as needed for moderate pain.     atorvastatin (LIPITOR) 20 MG tablet Take 1 tablet (20 mg total) by mouth at bedtime. 90 tablet 1   beclomethasone (QVAR REDIHALER) 80 MCG/ACT inhaler INHALE 2 PUFFS BY MOUTH INTO THE LUNGS DAILY 10.6 g 5  empagliflozin (JARDIANCE) 10 MG TABS tablet TAKE 1 TABLET BY MOUTH DAILY BEFORE BREAKFAST. 90 tablet 1   Fexofenadine HCl (ALLEGRA ALLERGY PO) Take by mouth.     fluticasone (FLONASE) 50 MCG/ACT nasal spray Place into the nose.     metFORMIN (GLUCOPHAGE-XR) 750 MG 24 hr tablet TAKE 2 TABLETS (1,500 MG TOTAL) BY MOUTH DAILY WITH BREAKFAST. 180 tablet 1   metoCLOPramide (REGLAN) 5 MG tablet TAKE 1 TABLET BY MOUTH 2 TO 3 TIMES A DAY AS NEEDED 60 tablet 2   omeprazole (PRILOSEC) 40 MG capsule TAKE 1 CAPSULE BY MOUTH TWICE A DAY 180 capsule 1   Hyoscyamine Sulfate SL (LEVSIN/SL) 0.125 MG SUBL Place 0.125 mg under the tongue every 4 (four) hours as needed (abd pain). 20 tablet 0   ondansetron (ZOFRAN  ODT) 4 MG disintegrating tablet Take 1 tablet (4 mg total) by mouth every 6 (six) hours as needed for nausea or vomiting. 20 tablet 0   polyethylene glycol (MIRALAX) 17 g packet Take 17 g by mouth daily. (Patient not taking: No sig reported) 14 each 0   triamcinolone (NASACORT) 55 MCG/ACT AERO nasal inhaler      Current Facility-Administered Medications  Medication Dose Route Frequency Provider Last Rate Last Admin   0.9 %  sodium chloride infusion  500 mL Intravenous Once Nikky Duba, Carlota Raspberry, MD        Allergies as of 07/06/2021   (No Known Allergies)    Family History  Problem Relation Age of Onset   Pancreatitis Mother    Diabetes Mother    Hyperlipidemia Mother    Colon polyps Mother        late 44s   Mitral valve prolapse Mother    Colon cancer Maternal Uncle        uncle in his 64s   Epilepsy Father    Crohn's disease Brother    Colon cancer Maternal Grandfather    CAD Other        GF in his 35s   Irritable bowel syndrome Other    Colitis Other    Heart disease Other    Prostate cancer Neg Hx     Social History   Socioeconomic History   Marital status: Married    Spouse name: Not on file   Number of children: 3   Years of education: Not on file   Highest education level: Not on file  Occupational History   Occupation: busines Administrator, computers  Tobacco Use   Smoking status: Former    Packs/day: 0.50    Types: Cigarettes    Start date: 2007    Quit date: 10/04/2008    Years since quitting: 12.7   Smokeless tobacco: Never  Vaping Use   Vaping Use: Never used  Substance and Sexual Activity   Alcohol use: Yes    Comment: rare   Drug use: Never   Sexual activity: Not on file  Other Topics Concern   Not on file  Social History Narrative   Household: pt, wife,    2001, 2007, 2008       Social Determinants of Radio broadcast assistant Strain: Not on [employer] Insecurity: Not on file  Transportation Needs: Not on file  Physical Activity: Not on  file  Stress: Not on file  Social Connections: Not on file  Intimate Partner Violence: Not on file    Review of Systems: All other review of systems negative except as mentioned in the HPI.  Physical Exam:  Vital signs BP (!) 146/86   Pulse 73   Temp 98 F (36.7 C)   Ht 6' (1.829 m)   Wt 240 lb (108.9 kg)   SpO2 98%   BMI 32.55 kg/m   General:   Alert,  Well-developed, pleasant and cooperative in NAD Lungs:  Clear throughout to auscultation.   Heart:  Regular rate and rhythm Abdomen:  Soft, nontender and nondistended.   Neuro/Psych:  Alert and cooperative. Normal mood and affect. A and O x 3  Jolly Mango, MD [facility]

## 2021-07-06 NOTE — Progress Notes (Signed)
Called to room to assist during endoscopic procedure.  Patient ID and intended procedure confirmed with present staff. Received instructions for my participation in the procedure from the performing physician.  

## 2021-07-06 NOTE — Progress Notes (Signed)
Pt's states no medical or surgical changes since previsit or office visit. 

## 2021-07-06 NOTE — Progress Notes (Signed)
D.T. vital signs. °

## 2021-07-06 NOTE — Progress Notes (Signed)
Upon arrival to recovery abdomen firm pt. C/o cramping instructed  pt.On releasing air and made physician aware repositioned from side to side and levsin 0.125mg   x2 administered with effectiveness abdomen soft.prior to d/c when assess for comfort pt. Stated "It's better,instructed pt. And spouse on contact number if any problems should occur  Later,verbalize understanding. Upon taking pt. Down stairs on the elevator pt. Informed me that he has cramps like that anyway and that he has a couple of things at home to take for them.

## 2021-07-06 NOTE — Progress Notes (Signed)
Sedate, gd SR, tolerated procedure well, VSS, report to RN 

## 2021-07-06 NOTE — Patient Instructions (Signed)
YOU HAD AN ENDOSCOPIC PROCEDURE TODAY AT THE [facility]:   Refer to the procedure report that was given to you for any specific questions about what was found during the examination.  If the procedure report does not answer your questions, please call your gastroenterologist to clarify.  If you requested that your care partner not be given the details of your procedure findings, then the procedure report has been included in a sealed envelope for you to review at your convenience later.  YOU SHOULD EXPECT: Some feelings of bloating in the abdomen. Passage of more gas than usual.  Walking can help get rid of the air that was put into your GI tract during the procedure and reduce the bloating. If you had a lower endoscopy (such as a colonoscopy or flexible sigmoidoscopy) you may notice spotting of blood in your stool or on the toilet paper. If you underwent a bowel prep for your procedure, you may not have a normal bowel movement for a few days.  Please Note:  You might notice some irritation and congestion in your nose or some drainage.  This is from the oxygen used during your procedure.  There is no need for concern and it should clear up in a day or so.  SYMPTOMS TO REPORT IMMEDIATELY:  Following lower endoscopy (colonoscopy or flexible sigmoidoscopy):  Excessive amounts of blood in the stool  Significant tenderness or worsening of abdominal pains  Swelling of the abdomen that is new, acute  Fever of 100F or higher    For urgent or emergent issues, a gastroenterologist can be reached at any hour by calling [phone]. Do not use MyChart messaging for urgent concerns.    DIET:  We do recommend a small meal at first, but then you may proceed to your regular diet.  Drink plenty of fluids but you should avoid alcoholic beverages for 24 hours.  ACTIVITY:  You should plan to take it easy for the rest of today and you should NOT DRIVE or use heavy machinery until tomorrow (because  of the sedation medicines used during the test).    FOLLOW UP: Our staff will call the number listed on your records 48-72 hours following your procedure to check on you and address any questions or concerns that you may have regarding the information given to you following your procedure. If we do not reach you, we will leave a message.  We will attempt to reach you two times.  During this call, we will ask if you have developed any symptoms of COVID 19. If you develop any symptoms (ie: fever, flu-like symptoms, shortness of breath, cough etc.) before then, please call [phone].  If you test positive for Covid 19 in the 2 weeks post procedure, please call and report this information to us.    If any biopsies were taken you will be contacted by phone or by letter within the next 1-3 weeks.  Please call us at [phone] if you have not heard about the biopsies in 3 weeks.    SIGNATURES/CONFIDENTIALITY: You and/or your care partner have signed paperwork which will be entered into your electronic medical record.  These signatures attest to the fact that that the information above on your After Visit Summary has been reviewed and is understood.  Full responsibility of the confidentiality of this discharge information lies with you and/or your care-partner.    Resume medications. Information given on polyps. 

## 2021-07-06 NOTE — Op Note (Signed)
[facility] Patient Name: Wayne Melton Procedure Date: 07/06/2021 4:19 PM MRN: [id number] Endoscopist: [name] , MD Age: 50 Referring MD:  Date of Birth: Dec 17, 1970 Gender: Male Account #: [id number] Procedure:                Colonoscopy Indications:              High risk colon cancer surveillance: Personal                            history of colonic polyps (adenoma in 2017), mother                            with colon polyps age 65s, multiple second degree                            relatives with colon cancer Medicines:                Monitored Anesthesia Care Procedure:                Pre-Anesthesia Assessment:                           - Prior to the procedure, a History and Physical                            was performed, and patient medications and                            allergies were reviewed. The patient's tolerance of                            previous anesthesia was also reviewed. The risks                            and benefits of the procedure and the sedation                            options and risks were discussed with the patient.                            All questions were answered, and informed consent                            was obtained. Prior Anticoagulants: The patient has                            taken no previous anticoagulant or antiplatelet                            agents. ASA Grade Assessment: III - A patient with                            severe systemic disease. After reviewing the risks  and benefits, the patient was deemed in                            satisfactory condition to undergo the procedure.                           After obtaining informed consent, the colonoscope                            was passed under direct vision. Throughout the                            procedure, the patient's blood pressure, pulse, and                            oxygen saturations were  monitored continuously. The                            Olympus CF-HQ190L (Serial# [id number]) Colonoscope was                            introduced through the anus and advanced to the the                            terminal ileum, with identification of the                            appendiceal orifice and IC valve. The colonoscopy                            was performed without difficulty. The patient                            tolerated the procedure well. The quality of the                            bowel preparation was adequate. The terminal ileum,                            ileocecal valve, appendiceal orifice, and rectum                            were photographed. Scope In: 4:29:16 PM Scope Out: 4:49:05 PM Scope Withdrawal Time: 0 hours 17 minutes 33 seconds  Total Procedure Duration: 0 hours 19 minutes 49 seconds  Findings:                 The perianal and digital rectal examinations were                            normal.                           '  The terminal ileum appeared normal.                           A 3 mm polyp was found in the transverse colon. The                            polyp was sessile. The polyp was removed with a                            cold snare. Resection and retrieval were complete.                           Internal hemorrhoids were found during retroflexion.                           The exam was otherwise without abnormality. Complications:            No immediate complications. Estimated blood loss:                            Minimal. Estimated Blood Loss:     Estimated blood loss was minimal. Impression:               - The examined portion of the ileum was normal.                           - One 3 mm polyp in the transverse colon, removed                            with a cold snare. Resected and retrieved.                           - Internal hemorrhoids.                           - The examination was otherwise  normal. Recommendation:           - Patient has a contact number available for                            emergencies. The signs and symptoms of potential                            delayed complications were discussed with the                            patient. Return to normal activities tomorrow.                            Written discharge instructions were provided to the                            patient.                           - Resume previous  diet.                           - Continue present medications.                           - Await pathology results. Remo Lipps P. [name], MD 07/06/2021 4:54:16 PM This report has been signed electronically.

## 2021-07-08 ENCOUNTER — Telehealth: Payer: Self-pay

## 2021-07-08 NOTE — Telephone Encounter (Signed)
  Follow up Call-  Call back number 07/06/2021  Post procedure Call Back phone  # [phone]  Permission to leave phone message Yes  Some recent data might be hidden     Patient questions:  Do you have a fever, pain , or abdominal swelling? No. Pain Score  0 *  Have you tolerated food without any problems? Yes.    Have you been able to return to your normal activities? Yes.    Do you have any questions about your discharge instructions: Diet   No. Medications  No. Follow up visit  No.  Do you have questions or concerns about your Care? No.  Actions: * If pain score is 4 or above: No action needed, pain <4.  Have you developed a fever since your procedure? no  2.   Have you had an respiratory symptoms (SOB or cough) since your procedure? no  3.   Have you tested positive for COVID 19 since your procedure no  4.   Have you had any family members/close contacts diagnosed with the COVID 19 since your procedure?  no   If yes to any of these questions please route to Joylene John, RN and Joella Prince, RN

## 2021-08-12 ENCOUNTER — Encounter: Payer: Self-pay | Admitting: Internal Medicine

## 2021-08-12 ENCOUNTER — Ambulatory Visit (INDEPENDENT_AMBULATORY_CARE_PROVIDER_SITE_OTHER): Payer: 59 | Admitting: Internal Medicine

## 2021-08-12 ENCOUNTER — Other Ambulatory Visit: Payer: Self-pay

## 2021-08-12 VITALS — BP 112/88 | HR 71 | Temp 97.9°F | Resp 18 | Ht 72.0 in | Wt 238.4 lb

## 2021-08-12 DIAGNOSIS — E119 Type 2 diabetes mellitus without complications: Secondary | ICD-10-CM

## 2021-08-12 DIAGNOSIS — Z0000 Encounter for general adult medical examination without abnormal findings: Secondary | ICD-10-CM

## 2021-08-12 DIAGNOSIS — Z23 Encounter for immunization: Secondary | ICD-10-CM

## 2021-08-12 NOTE — Patient Instructions (Addendum)
   GO TO THE LAB : Get the blood work     GO TO THE FRONT DESK, [place] Come back for   a check up in 6 months    Take melatonin 4 to 8 mg every night  HEALTHY SLEEP Sleep hygiene: Basic rules for a good night's sleep  Sleep only as much as you need to feel rested and then get out of bed  Keep a regular sleep schedule  Avoid forcing sleep  Exercise regularly for at least 20 minutes, preferably 4 to 5 hours before bedtime  Avoid caffeinated beverages after lunch  Avoid alcohol near bedtime: no "night cap"  Avoid smoking, especially in the evening  Do not go to bed hungry  Adjust bedroom environment  Avoid prolonged use of light-emitting screens before bedtime   Deal with your worries before bedtime

## 2021-08-12 NOTE — Progress Notes (Signed)
Subjective:    Patient ID: Wayne Melton, male    DOB: 11-Jul-1971, 50 y.o.   MRN: [id number]  DOS:  08/12/2021 Type of visit - description: CPX  Here for CPX. In general feels well. Did report a few issues: Has episodic insomnia, usually wakes up in the middle of the night and cannot go back to sleep. Had mild diarrhea without blood in the stool for the last few days.  No other new GI symptoms. Very rarely has palpitations, no associated chest pain or difficulty breathing.  Wt Readings from Last 3 Encounters:  08/12/21 238 lb 6.4 oz (108.1 kg)  07/06/21 240 lb (108.9 kg)  06/09/21 245 lb (111.1 kg)     Review of Systems  Other than above, a 14 point review of systems is negative      Past Medical History:  Diagnosis Date   Allergic rhinitis 07/05/2012   Constipation    chronic   Diabetes mellitus without complication (HCC)    No meds   Fatty liver disease, nonalcoholic    Gastroparesis    GERD , HH, h/o PUD 07/05/2012   Hyperlipidemia    IBS (irritable bowel syndrome) 11/06/2012   Obesity    OSA (obstructive sleep apnea)    mild   Seasonal allergies    Seasonal allergies    Sleep apnea     Past Surgical History:  Procedure Laterality Date   COLONOSCOPY     UPPER GASTROINTESTINAL ENDOSCOPY     Social History   Socioeconomic History   Marital status: Married    Spouse name: Not on file   Number of children: 3   Years of education: Not on file   Highest education level: Not on file  Occupational History   Occupation: busines Administrator, computers  Tobacco Use   Smoking status: Former    Packs/day: 0.50    Types: Cigarettes    Start date: 2007   Smokeless tobacco: Never   Tobacco comments:    On-off quit, currently not smoking 08-2021  Vaping Use   Vaping Use: Never used  Substance and Sexual Activity   Alcohol use: Yes    Comment: rare   Drug use: Never   Sexual activity: Not on file  Other Topics Concern   Not on file  Social History  Narrative   Household: pt, wife,    2001, 2007, 2008       Social Determinants of Health   Financial Resource Strain: Not on file  Food Insecurity: Not on file  Transportation Needs: Not on file  Physical Activity: Not on file  Stress: Not on file  Social Connections: Not on file  Intimate Partner Violence: Not on file    Allergies as of 08/12/2021   No Known Allergies      Medication List        Accurate as of August 12, 2021 11:59 PM. If you have any questions, ask your nurse or doctor.          acetaminophen 500 MG tablet Commonly known as: TYLENOL Take 1,000 mg by mouth every 6 (six) hours as needed for moderate pain.   ALLEGRA ALLERGY PO Take by mouth.   atorvastatin 20 MG tablet Commonly known as: LIPITOR Take 1 tablet (20 mg total) by mouth at bedtime.   fluticasone 50 MCG/ACT nasal spray Commonly known as: FLONASE Place into the nose.   Hyoscyamine Sulfate SL 0.125 MG Subl Commonly known as: Levsin/SL Place 0.125 mg under the tongue  every 4 (four) hours as needed (abd pain).   Jardiance 10 MG Tabs tablet Generic drug: empagliflozin TAKE 1 TABLET BY MOUTH DAILY BEFORE BREAKFAST.   metFORMIN 750 MG 24 hr tablet Commonly known as: GLUCOPHAGE-XR TAKE 2 TABLETS (1,500 MG TOTAL) BY MOUTH DAILY WITH BREAKFAST.   metoCLOPramide 5 MG tablet Commonly known as: REGLAN TAKE 1 TABLET BY MOUTH 2 TO 3 TIMES A DAY AS NEEDED   omeprazole 40 MG capsule Commonly known as: PRILOSEC TAKE 1 CAPSULE BY MOUTH TWICE A DAY   ondansetron 4 MG disintegrating tablet Commonly known as: Zofran ODT Take 1 tablet (4 mg total) by mouth every 6 (six) hours as needed for nausea or vomiting.   polyethylene glycol 17 g packet Commonly known as: MiraLax Take 17 g by mouth daily.   Qvar RediHaler 80 MCG/ACT inhaler Generic drug: beclomethasone INHALE 2 PUFFS BY MOUTH INTO THE LUNGS DAILY   triamcinolone 55 MCG/ACT Aero nasal inhaler Commonly known as: NASACORT            Objective:   Physical Exam BP 112/88 (BP Location: Right Arm, Patient Position: Sitting, Cuff Size: Normal)   Pulse 71   Temp 97.9 F (36.6 C) (Oral)   Resp 18   Ht 6' (1.829 m)   Wt 238 lb 6.4 oz (108.1 kg)   SpO2 98%   BMI 32.33 kg/m  General: Well developed, NAD, BMI noted Neck: No  thyromegaly  HEENT:  Normocephalic . Face symmetric, atraumatic Lungs:  CTA B Normal respiratory effort, no intercostal retractions, no accessory muscle use. Heart: RRR,  no murmur.  Abdomen:  Not distended, soft, non-tender. No rebound or rigidity.   Lower extremities: no pretibial edema bilaterally  Skin: Exposed areas without rash. Not pale. Not jaundice Neurologic:  alert & oriented X3.  Speech normal, gait appropriate for age and unassisted Strength symmetric and appropriate for age.  Psych: Cognition and judgment appear intact.  Cooperative with normal attention span and concentration.  Behavior appropriate. No anxious or depressed appearing.     Assessment     Assessment DM Hyperlipidemia Bronchospasm (on Qvar) GI: --h/o PUD (EGD @ [facility]  2002: neg  but reports had another EGD with ulcer) --GERD, IBS, occ constipation, gastroparesis (+ gastric emptying study October 2020) -- fatty liver per u/s 06-2015 -EGD 08-2017: Gastritis, polyps, benign findings per GI letter, no follow-up OSA:  (-)  home sleep apnea test 2014,  Full sleep study~08-2015 : mild OSA, saw pulmonary, rx dental appliance, wt loss HST 08-2018: Rx CPAP - DOE x years>>>>  neg stress test 03/2015 Abnormal brain MRI 05-2020 (subsequently saw neurosurgery, recommend observation, per patient, no documentation)  PLAN DM: Since he is started Jardiance has noticed some weight loss, he also takes Metformin.  Check A1c. Hyperlipidemia: Well-controlled on Lipitor Bronchospasm: On Qvar, at some point will need to switch to Flovent.  He will let us know. Insomnia: Recommend good sleep habits, see AVS.  Also change  melatonin from prn to nightly. RTC 6 months    This visit occurred during the SARS-CoV-2 public health emergency.  Safety protocols were in place, including screening questions prior to the visit, additional usage of staff PPE, and extensive cleaning of exam room while observing appropriate contact time as indicated for disinfecting solutions.

## 2021-08-13 ENCOUNTER — Encounter: Payer: Self-pay | Admitting: Internal Medicine

## 2021-08-13 LAB — BASIC METABOLIC PANEL
BUN: 9 mg/dL (ref 6–23)
CO2: 28 mEq/L (ref 19–32)
Calcium: 9.7 mg/dL (ref 8.4–10.5)
Chloride: 100 mEq/L (ref 96–112)
Creatinine, Ser: 0.93 mg/dL (ref 0.40–1.50)
GFR: 97.37 mL/min (ref >60.00)
Glucose, Bld: 92 mg/dL (ref 70–99)
Potassium: 4.2 mEq/L (ref 3.5–5.1)
Sodium: 138 mEq/L (ref 135–145)

## 2021-08-13 LAB — HEMOGLOBIN A1C: Hgb A1c MFr Bld: 6.5 % (ref 4.6–6.5)

## 2021-08-13 NOTE — Assessment & Plan Note (Signed)
DM: Since he is started Jardiance has noticed some weight loss, he also takes Metformin.  Check A1c. Hyperlipidemia: Well-controlled on Lipitor Bronchospasm: On Qvar, at some point will need to switch to Flovent.  He will let us know. Insomnia: Recommend good sleep habits, see AVS.  Also change melatonin from prn to nightly. RTC 6 months

## 2021-08-13 NOTE — Assessment & Plan Note (Signed)
-  Td 2016 -  pnm shot: 2016; prevnar 2017 - covid shot: rec bivalent shot  - flu shot today -CCS: +FH   colon polyps (mother, early 76s ) and colon cancer ( uncle, mid 45s); cscope 12-2015, Cscope 07-2021, next per GI  -Diet and exercise: Has lost some weight, since he is started Jardiance, encourage healthy diet and increase exercise -Labs: Reviewed, will get a BMP,A1c  Tobacco: Already quit, praised

## 2021-08-18 ENCOUNTER — Other Ambulatory Visit: Payer: Self-pay | Admitting: Gastroenterology

## 2021-09-08 ENCOUNTER — Other Ambulatory Visit: Payer: Self-pay | Admitting: Internal Medicine

## 2021-11-12 ENCOUNTER — Encounter: Payer: Self-pay | Admitting: Internal Medicine

## 2021-11-22 ENCOUNTER — Other Ambulatory Visit: Payer: Self-pay | Admitting: Gastroenterology

## 2021-11-25 ENCOUNTER — Encounter: Payer: Self-pay | Admitting: Internal Medicine

## 2021-11-25 ENCOUNTER — Ambulatory Visit (INDEPENDENT_AMBULATORY_CARE_PROVIDER_SITE_OTHER): Payer: 59 | Admitting: Internal Medicine

## 2021-11-25 VITALS — BP 122/70 | HR 65 | Temp 97.8°F | Resp 18 | Ht 72.0 in | Wt 247.5 lb

## 2021-11-25 DIAGNOSIS — J069 Acute upper respiratory infection, unspecified: Secondary | ICD-10-CM

## 2021-11-25 DIAGNOSIS — H6123 Impacted cerumen, bilateral: Secondary | ICD-10-CM | POA: Diagnosis not present

## 2021-11-25 NOTE — Progress Notes (Signed)
Subjective:    Patient ID: Wayne Melton, male    DOB: 1971/02/25, 51 y.o.   MRN: [id number]  DOS:  11/25/2021 Type of visit - description: Acute  Symptoms started approximately 2 weeks ago, at the time his wife was also sick with flulike symptoms.  Symptoms are headache, ear ache, sinus congestion. Denies blowing any discharge from the nose. Taking Zyrtec and Flonase.  No fever chills Occasional sneezing Denies chest congestion, minimal cough if any. No nausea or vomiting Some myalgias    Review of Systems See above   Past Medical History:  Diagnosis Date   Allergic rhinitis 07/05/2012   Constipation    chronic   Diabetes mellitus without complication (HCC)    No meds   Fatty liver disease, nonalcoholic    Gastroparesis    GERD , HH, h/o PUD 07/05/2012   Hyperlipidemia    IBS (irritable bowel syndrome) 11/06/2012   Obesity    OSA (obstructive sleep apnea)    mild   Seasonal allergies    Seasonal allergies    Sleep apnea     Past Surgical History:  Procedure Laterality Date   COLONOSCOPY     UPPER GASTROINTESTINAL ENDOSCOPY      Current Outpatient Medications  Medication Instructions   acetaminophen (TYLENOL) 1,000 mg, Oral, Every 6 hours PRN   atorvastatin (LIPITOR) 20 MG tablet TAKE 1 TABLET BY MOUTH EVERYDAY AT BEDTIME   beclomethasone (QVAR REDIHALER) 80 MCG/ACT inhaler INHALE 2 PUFFS BY MOUTH INTO THE LUNGS DAILY   empagliflozin (JARDIANCE) 10 MG TABS tablet TAKE 1 TABLET BY MOUTH DAILY BEFORE BREAKFAST.   Fexofenadine HCl (ALLEGRA ALLERGY PO) Take by mouth.   fluticasone (FLONASE) 50 MCG/ACT nasal spray Nasal   Hyoscyamine Sulfate SL (LEVSIN/SL) 0.125 mg, Sublingual, Every 4 hours PRN   metFORMIN (GLUCOPHAGE-XR) 1,500 mg, Oral, Daily with breakfast   metoCLOPramide (REGLAN) 5 MG tablet TAKE 1 TABLET BY MOUTH 2 TO 3 TIMES A DAY AS NEEDED   omeprazole (PRILOSEC) 40 MG capsule TAKE 1 CAPSULE BY MOUTH TWICE A DAY   ondansetron (ZOFRAN ODT) 4 mg,  Oral, Every 6 hours PRN   polyethylene glycol (MIRALAX) 17 g, Oral, Daily   triamcinolone (NASACORT) 55 MCG/ACT AERO nasal inhaler No dose, route, or frequency recorded.       Objective:   Physical Exam BP 122/70 (BP Location: Left Arm, Patient Position: Sitting, Cuff Size: Normal)    Pulse 65    Temp 97.8 F (36.6 C) (Oral)    Resp 18    Ht 6' (1.829 m)    Wt 247 lb 8 oz (112.3 kg)    SpO2 96%    BMI 33.57 kg/m  General:   Well developed, NAD, BMI noted. HEENT:  Normocephalic . Face symmetric, atraumatic. Cerumen impaction bilateral. Throat: Symmetric Nose not congested Sinuses no TTP Lungs:  CTA B Normal respiratory effort, no intercostal retractions, no accessory muscle use. Heart: RRR,  no murmur.  Lower extremities: no pretibial edema bilaterally  Skin: Not pale. Not jaundice Neurologic:  alert & oriented X3.  Speech normal, gait appropriate for age and unassisted Psych--  Cognition and judgment appear intact.  Cooperative with normal attention span and concentration.  Behavior appropriate. No anxious or depressed appearing.      Assessment      Assessment DM Hyperlipidemia Bronchospasm (on Qvar) GI: --h/o PUD (EGD @ [facility]  2002: neg  but reports had another EGD with ulcer) --GERD, IBS, occ constipation, gastroparesis (+ gastric emptying  study October 2020) -- fatty liver per u/s 06-2015 -EGD 08-2017: Gastritis, polyps, benign findings per GI letter, no follow-up OSA:  (-)  home sleep apnea test 2014,  Full sleep study~08-2015 : mild OSA, saw pulmonary, rx dental appliance, wt loss HST 08-2018: Rx CPAP - DOE x years>>>>  neg stress test 03/2015 Abnormal brain MRI 05-2020 (subsequently saw neurosurgery, recommend observation, per patient, no documentation)  PLAN URI: Symptoms started 2 weeks ago, mostly headache, sinus congestion.  Exam is benign.  Recommend to continue Zyrtec and Flonase, add Astepro, call if not gradually better.  Consider a round of  antibiotics.  See AVS. Cerumen impaction:  Procedure  With the patient consent, I remove abundant dark cerumen from both ears, the L ear was completely clear, TM normal.  I was unable to remove all the wax from the R ear. Completely clear the L side, still has some wax on the R.  Recommend peroxide. Floaters: At the end of the visit he mentioned floaters at the L eye for a couple of days.  Exam: EOMI, pupils equal reactive, chambers normal.  Recommend to see his eye doctor within a day or 2.  He verbalized understanding.       This visit occurred during the SARS-CoV-2 public health emergency.  Safety protocols were in place, including screening questions prior to the visit, additional usage of staff PPE, and extensive cleaning of exam room while observing appropriate contact time as indicated for disinfecting solutions.

## 2021-11-25 NOTE — Patient Instructions (Addendum)
Per our records you are due for your diabetic eye exam. Please contact your eye doctor to schedule an appointment. Please have them send copies of your office visit notes to [place]. Our fax number is [phone]. If you need a referral to an eye doctor please let us know.  Continue Zyrtec  Continue  over-the-counter Flonase: 2 nasal sprays on each side of the nose in the morning until you feel better  Start OTC Astepro 2 nasal sprays on each side of the nose twice daily until better  Avoid decongestants such as  Pseudoephedrine or phenylephrine    Call if not gradually better over the next  10 days    Please see your eye doctor today or tomorrow

## 2021-11-26 NOTE — Assessment & Plan Note (Signed)
URI: Symptoms started 2 weeks ago, mostly headache, sinus congestion.  Exam is benign.  Recommend to continue Zyrtec and Flonase, add Astepro, call if not gradually better.  Consider a round of antibiotics.  See AVS. Cerumen impaction:  Procedure  With the patient consent, I remove abundant dark cerumen from both ears, the L ear was completely clear, TM normal.  I was unable to remove all the wax from the R ear. Completely clear the L side, still has some wax on the R.  Recommend peroxide. Floaters: At the end of the visit he mentioned floaters at the L eye for a couple of days.  Exam: EOMI, pupils equal reactive, chambers normal.  Recommend to see his eye doctor within a day or 2.  He verbalized understanding.

## 2021-12-16 ENCOUNTER — Other Ambulatory Visit: Payer: Self-pay | Admitting: Internal Medicine

## 2022-02-10 ENCOUNTER — Ambulatory Visit (INDEPENDENT_AMBULATORY_CARE_PROVIDER_SITE_OTHER): Payer: 59 | Admitting: Internal Medicine

## 2022-02-10 ENCOUNTER — Encounter: Payer: Self-pay | Admitting: Internal Medicine

## 2022-02-10 VITALS — BP 126/82 | HR 75 | Temp 98.0°F | Resp 16 | Ht 72.0 in | Wt 256.1 lb

## 2022-02-10 DIAGNOSIS — E119 Type 2 diabetes mellitus without complications: Secondary | ICD-10-CM | POA: Diagnosis not present

## 2022-02-10 DIAGNOSIS — J9801 Acute bronchospasm: Secondary | ICD-10-CM | POA: Diagnosis not present

## 2022-02-10 DIAGNOSIS — E785 Hyperlipidemia, unspecified: Secondary | ICD-10-CM

## 2022-02-10 LAB — LIPID PANEL
Cholesterol: 228 mg/dL — ABNORMAL HIGH (ref 0–200)
HDL: 36.8 mg/dL — ABNORMAL LOW (ref >39.00)
Total CHOL/HDL Ratio: 6
Triglycerides: 585 mg/dL — ABNORMAL HIGH (ref 0.0–149.0)

## 2022-02-10 LAB — LDL CHOLESTEROL, DIRECT: Direct LDL: 99 mg/dL

## 2022-02-10 LAB — MICROALBUMIN / CREATININE URINE RATIO
Creatinine,U: 32.9 mg/dL
Microalb Creat Ratio: 2.1 mg/g (ref 0.0–30.0)
Microalb, Ur: <0.7 mg/dL (ref 0.0–1.9)

## 2022-02-10 LAB — HEMOGLOBIN A1C: Hgb A1c MFr Bld: 6.9 % — ABNORMAL HIGH (ref 4.6–6.5)

## 2022-02-10 MED ORDER — FLUTICASONE PROPIONATE HFA 110 MCG/ACT IN AERO: 1.0000 | INHALATION_SPRAY | Freq: Two times a day (BID) | RESPIRATORY_TRACT | 12 refills | Status: DC

## 2022-02-10 NOTE — Assessment & Plan Note (Signed)
DM: On metformin, Jardiance, no ambulatory CBGs, last BMP okay, check A1c and micro. ?Feet exam negative ?Hyperlipidemia: On Lipitor, check FLP, last LFTs normal ?Bronchospasm: On Qvar, well controlled, due to insurance issues will switch to Flovent, okay to use inhalers as needed and see how he does. ?RTC ,CPX,  November 2023 ? ?  ?

## 2022-02-10 NOTE — Patient Instructions (Addendum)
Recommend to proceed with covid booster (bivalent) at your pharmacy.  ? ?Instead of Qvar, use Flovent 1 puff twice daily as needed ?  ? ?GO TO THE LAB : Get the blood work   ? ? ?[place], [place] ?Come back for a physical exam by November 2023 ?

## 2022-02-10 NOTE — Progress Notes (Signed)
? ?Subjective:  ? ? Patient ID: Wayne Melton, male    DOB: October 24, 1970, 51 y.o.   MRN: [id number] ? ?DOS:  02/10/2022 ?Type of visit - description: f/u ? ?Today with talk about his chronic medical problems. ?Qvar will no longer be covered by his insurance, need to switch to another inhaler. ? ?Denies chest pain or difficulty breathing ?No lower extremity paresthesias ? ? ?Review of Systems ?See above  ? ?Past Medical History:  ?Diagnosis Date  ? Allergic rhinitis 07/05/2012  ? Constipation   ? chronic  ? Diabetes mellitus without complication ([place])   ? No meds  ? Fatty liver disease, nonalcoholic   ? Gastroparesis   ? GERD , HH, h/o PUD 07/05/2012  ? Hyperlipidemia   ? IBS (irritable bowel syndrome) 11/06/2012  ? Obesity   ? OSA (obstructive sleep apnea)   ? mild  ? Seasonal allergies   ? Seasonal allergies   ? Sleep apnea   ? ? ?Past Surgical History:  ?Procedure Laterality Date  ? COLONOSCOPY    ? UPPER GASTROINTESTINAL ENDOSCOPY    ? ? ?Current Outpatient Medications  ?Medication Instructions  ? acetaminophen (TYLENOL) 1,000 mg, Oral, Every 6 hours PRN  ? atorvastatin (LIPITOR) 20 MG tablet TAKE 1 TABLET BY MOUTH EVERYDAY AT BEDTIME  ? beclomethasone (QVAR REDIHALER) 80 MCG/ACT inhaler INHALE 2 PUFFS BY MOUTH INTO THE LUNGS DAILY  ? fluticasone (FLONASE) 50 MCG/ACT nasal spray Nasal  ? Hyoscyamine Sulfate SL (LEVSIN/SL) 0.125 mg, Sublingual, Every 4 hours PRN  ? JARDIANCE 10 MG TABS tablet TAKE 1 TABLET BY MOUTH EVERY DAY BEFORE BREAKFAST  ? metFORMIN (GLUCOPHAGE-XR) 1,500 mg, Oral, Daily with breakfast  ? metoCLOPramide (REGLAN) 5 MG tablet TAKE 1 TABLET BY MOUTH 2 TO 3 TIMES A DAY AS NEEDED  ? omeprazole (PRILOSEC) 40 MG capsule TAKE 1 CAPSULE BY MOUTH TWICE A DAY  ? ondansetron (ZOFRAN ODT) 4 mg, Oral, Every 6 hours PRN  ? polyethylene glycol (MIRALAX) 17 g, Oral, Daily  ? ? ?   ?Objective:  ? Physical Exam ?BP 126/82 (BP Location: Left Arm, Patient Position: Sitting, Cuff Size: Normal)   Pulse 75   Temp  98 ?F (36.7 ?C) (Oral)   Resp 16   Ht 6' (1.829 m)   Wt 256 lb 2 oz (116.2 kg)   SpO2 98%   BMI 34.74 kg/m?  ?General:   ?Well developed, NAD, BMI noted. ?HEENT:  ?Normocephalic . Face symmetric, atraumatic ?Lungs:  ?CTA B ?Normal respiratory effort, no intercostal retractions, no accessory muscle use. ?Heart: RRR,  no murmur.  ?DM foot exam: ?No edema, good pedal pulses, pinprick examination normal ?Skin: Not pale. Not jaundice ?Neurologic:  ?alert & oriented X3.  ?Speech normal, gait appropriate for age and unassisted ?Psych--  ?Cognition and judgment appear intact.  ?Cooperative with normal attention span and concentration.  ?Behavior appropriate. ?No anxious or depressed appearing.  ? ?   ?Assessment   ? ?  Assessment ?DM ?Hyperlipidemia ?Bronchospasm (on Qvar) ?GI: ?--h/o PUD (EGD @ [facility]  2002: neg  but reports had another EGD with ulcer) ?--GERD, IBS, occ constipation, gastroparesis (+ gastric emptying study October 2020) ?-- fatty liver per u/s 06-2015 ?-EGD 08-2017: Gastritis, polyps, benign findings per GI letter, no follow-up ?OSA:  ?(-)  home sleep apnea test 2014,  Full sleep study~08-2015 : mild OSA, saw pulmonary, rx dental appliance, wt loss ?HST 08-2018: Rx CPAP ?- DOE x years>>>>  neg stress test 03/2015 ?Abnormal brain MRI 05-2020 (subsequently  saw neurosurgery, recommend observation, per patient, no documentation) ? ?PLAN ?DM: On metformin, Jardiance, no ambulatory CBGs, last BMP okay, check A1c and micro. ?Feet exam negative ?Hyperlipidemia: On Lipitor, check FLP, last LFTs normal ?Bronchospasm: On Qvar, well controlled, due to insurance issues will switch to Flovent, okay to use inhalers as needed and see how he does. ?RTC ,CPX,  November 2023 ? ?  ?

## 2022-02-12 MED ORDER — FENOFIBRATE 145 MG PO TABS
145.0000 mg | ORAL_TABLET | Freq: Every day | ORAL | 2 refills | Status: DC
Start: 2022-02-12 — End: 2022-10-11

## 2022-02-12 NOTE — Addendum Note (Signed)
Addended byDamita Dunnings D on: 02/12/2022 08:45 AM ? ? Modules accepted: Orders ? ?

## 2022-03-04 ENCOUNTER — Encounter: Payer: Self-pay | Admitting: Internal Medicine

## 2022-03-08 ENCOUNTER — Other Ambulatory Visit: Payer: Self-pay | Admitting: Internal Medicine

## 2022-03-10 ENCOUNTER — Other Ambulatory Visit: Payer: Self-pay | Admitting: Gastroenterology

## 2022-04-12 ENCOUNTER — Other Ambulatory Visit: Payer: Self-pay

## 2022-04-12 ENCOUNTER — Telehealth: Payer: Self-pay

## 2022-04-12 MED ORDER — ASMANEX HFA 100 MCG/ACT IN AERO: 1.0000 | INHALATION_SPRAY | Freq: Two times a day (BID) | RESPIRATORY_TRACT | 6 refills | Status: DC

## 2022-04-12 NOTE — Telephone Encounter (Signed)
Flovent HFA no longer covered by insurance. Preferred alternatives: Advair HFA, Alvesco, Asmanex HFA, Dulera, Pulmicort Flexhaler, and Spiriva Respimat.

## 2022-04-12 NOTE — Telephone Encounter (Signed)
Asmanex XR sent

## 2022-04-12 NOTE — Telephone Encounter (Signed)
Mychart message sent to Pt to make him aware of med change.

## 2022-04-14 ENCOUNTER — Telehealth: Payer: Self-pay

## 2022-04-14 NOTE — Telephone Encounter (Signed)
PA initiated via Covermymeds; KEY: [id number]. Awaiting determination.

## 2022-04-16 NOTE — Telephone Encounter (Signed)
PA approved. Effective 04/15/2022 to 04/16/2023.

## 2022-04-29 ENCOUNTER — Other Ambulatory Visit: Payer: Self-pay | Admitting: Internal Medicine

## 2022-04-29 ENCOUNTER — Other Ambulatory Visit: Payer: Self-pay | Admitting: Gastroenterology

## 2022-04-29 MED ORDER — BUDESONIDE 180 MCG/ACT IN AEPB: 1.0000 | INHALATION_SPRAY | Freq: Two times a day (BID) | RESPIRATORY_TRACT | 6 refills | Status: DC

## 2022-05-25 ENCOUNTER — Encounter: Payer: Self-pay | Admitting: Internal Medicine

## 2022-05-26 ENCOUNTER — Ambulatory Visit
Admission: EM | Admit: 2022-05-26 | Discharge: 2022-05-26 | Disposition: A | Discharge status: Home / Self Care | Payer: 59 | Attending: Family Medicine | Admitting: Family Medicine

## 2022-05-26 DIAGNOSIS — L255 Unspecified contact dermatitis due to plants, except food: Secondary | ICD-10-CM

## 2022-05-26 MED ORDER — CLOBETASOL PROPIONATE 0.05 % EX CREA: 1.0000 | TOPICAL_CREAM | Freq: Two times a day (BID) | CUTANEOUS | 0 refills | Status: DC

## 2022-05-26 NOTE — ED Provider Notes (Signed)
[facility]   [id number] 05/26/22 Arrival Time: [place] PLAN:  1. Rhus dermatitis    No signs of skin infection. Wants to avoid oral prednisone secondary to DM. Does not check blood sugars regularly.  Begin: Meds ordered this encounter  Medications   clobetasol cream (TEMOVATE) 0.05 %    Sig: Apply 1 Application topically 2 (two) times daily.    Dispense:  45 g    Refill:  0    Will follow up with PCP or here if worsening or failing to improve as anticipated. Reviewed expectations re: course of current medical issues. Questions answered. Outlined signs and symptoms indicating need for more acute intervention. Patient verbalized understanding. After Visit Summary given.   SUBJECTIVE:  Wayne Melton is a 51 y.o. male who presents with a skin complaint. Ques rhus. Working outside last week. Now itchy rash. No drainage/bleeding. Afebrile.    OBJECTIVE: Vitals:   05/26/22 1306  BP: 115/78  Pulse: 84  Resp: 18  Temp: 98.1 F (36.7 C)  TempSrc: Oral  SpO2: 98%    General appearance: alert; no distress HEENT: [place]; AT Neck: supple with FROM Extremities: no edema; moves all extremities normally Skin: warm and dry; signs of infection: none; areas of linear papules and vesicles with surrounding erythema over bilateral forearms Psychological: alert and cooperative; normal mood and affect  No Known Allergies  Past Medical History:  Diagnosis Date   Allergic rhinitis 07/05/2012   Constipation    chronic   Diabetes mellitus without complication (HCC)    No meds   Fatty liver disease, nonalcoholic    Gastroparesis    GERD , HH, h/o PUD 07/05/2012   Hyperlipidemia    IBS (irritable bowel syndrome) 11/06/2012   Obesity    OSA (obstructive sleep apnea)    mild   Seasonal allergies    Seasonal allergies    Sleep apnea    Social History   Socioeconomic History   Marital status: Married    Spouse name: Not on file   Number of children: 3    Years of education: Not on file   Highest education level: Not on file  Occupational History   Occupation: busines Administrator, computers  Tobacco Use   Smoking status: Former    Packs/day: 0.50    Types: Cigarettes    Start date: 2007   Smokeless tobacco: Never   Tobacco comments:    On-off quit, currently not smoking 08-2021  Vaping Use   Vaping Use: Never used  Substance and Sexual Activity   Alcohol use: Yes    Comment: rare   Drug use: Never   Sexual activity: Not on file  Other Topics Concern   Not on file  Social History Narrative   Household: pt, wife,    2001, 2007, 2008       Social Determinants of Radio broadcast assistant Strain: Not on Art therapist Insecurity: Not on file  Transportation Needs: Not on file  Physical Activity: Not on file  Stress: Not on file  Social Connections: Not on file  Intimate Partner Violence: Not on file   Family History  Problem Relation Age of Onset   Pancreatitis Mother    Diabetes Mother    Hyperlipidemia Mother    Colon polyps Mother        late 75s   Mitral valve prolapse Mother    Colon cancer Maternal Uncle        uncle in his  49s   Epilepsy Father    Crohn's disease Brother    Colon cancer Maternal Grandfather    CAD Other        GF in his 71s   Irritable bowel syndrome Other    Colitis Other    Heart disease Other    Prostate cancer Neg Hx    Past Surgical History:  Procedure Laterality Date   COLONOSCOPY     UPPER GASTROINTESTINAL ENDOSCOPY        Vanessa Kick, MD 05/26/22 1400

## 2022-05-26 NOTE — ED Triage Notes (Signed)
Pt presents with rash to L and R forearms.  Started after doing yard work 1-2 weeks ago.  Intermittent itching, no pain. No home tx.

## 2022-07-16 ENCOUNTER — Encounter: Payer: Self-pay | Admitting: Internal Medicine

## 2022-07-16 ENCOUNTER — Ambulatory Visit (INDEPENDENT_AMBULATORY_CARE_PROVIDER_SITE_OTHER): Payer: 59 | Admitting: Internal Medicine

## 2022-07-16 VITALS — BP 126/76 | HR 72 | Temp 98.2°F | Resp 18 | Ht 72.0 in | Wt 258.5 lb

## 2022-07-16 DIAGNOSIS — J9801 Acute bronchospasm: Secondary | ICD-10-CM

## 2022-07-16 DIAGNOSIS — J302 Other seasonal allergic rhinitis: Secondary | ICD-10-CM

## 2022-07-16 NOTE — Progress Notes (Unsigned)
Subjective:    Patient ID: Wayne Melton, male    DOB: [birth year], 51 y.o.   MRN: [id number]  DOS:  07/16/2022 Type of visit - description: Acute visit  Symptoms started 2 weeks ago with ear fullness, initially on the right and then bilateral. temporarily had body aches. He is feeling somewhat tired.  Denies fever chills. Some sneezing.  No runny nose or sore throat.  No itchy eyes. Some postnasal dripping.  History of bronchospasms, currently well controlled.  No cough. No nausea or vomiting Abdominal cramps: At baseline.  Review of Systems See above   Past Medical History:  Diagnosis Date   Allergic rhinitis 07/05/2012   Constipation    chronic   Diabetes mellitus without complication (HCC)    No meds   Fatty liver disease, nonalcoholic    Gastroparesis    GERD , HH, h/o PUD 07/05/2012   Hyperlipidemia    IBS (irritable bowel syndrome) 11/06/2012   Obesity    OSA (obstructive sleep apnea)    mild   Seasonal allergies    Seasonal allergies    Sleep apnea     Past Surgical History:  Procedure Laterality Date   COLONOSCOPY     UPPER GASTROINTESTINAL ENDOSCOPY      Current Outpatient Medications  Medication Instructions   acetaminophen (TYLENOL) 1,000 mg, Oral, Every 6 hours PRN   atorvastatin (LIPITOR) 20 MG tablet TAKE 1 TABLET BY MOUTH EVERYDAY AT BEDTIME   budesonide (PULMICORT) 180 MCG/ACT inhaler 1 puff, Inhalation, 2 times daily   clobetasol cream (TEMOVATE) 6.60 % 1 Application, Topical, 2 times daily   fenofibrate (TRICOR) 145 mg, Oral, Daily   fluticasone (FLONASE) 50 MCG/ACT nasal spray Nasal   Hyoscyamine Sulfate SL (LEVSIN/SL) 0.125 mg, Sublingual, Every 4 hours PRN   JARDIANCE 10 MG TABS tablet TAKE 1 TABLET BY MOUTH EVERY DAY BEFORE BREAKFAST   metFORMIN (GLUCOPHAGE-XR) 750 MG 24 hr tablet TAKE 2 TABLETS (1,500 MG TOTAL) BY MOUTH EVERY DAY WITH BREAKFAST   metoCLOPramide (REGLAN) 5 MG tablet TAKE 1 TABLET BY MOUTH 2 TO 3 TIMES A DAY AS  NEEDED   omeprazole (PRILOSEC) 40 MG capsule TAKE 1 CAPSULE BY MOUTH TWICE A DAY   polyethylene glycol (MIRALAX) 17 g, Oral, Daily       Objective:   Physical Exam BP 126/76   Pulse 72   Temp 98.2 F (36.8 C) (Oral)   Resp 18   Ht 6' (1.829 m)   Wt 258 lb 8 oz (117.3 kg)   SpO2 97%   BMI 35.06 kg/m  General:   Well developed, NAD, BMI noted. HEENT:  Normocephalic . Face symmetric, atraumatic Left ear: Normal Right ear: Cerumen impaction noted, removed with a spoon, TM was normal. Throat: Symmetric, normal rate.  No white patches. Lungs:  CTA B Normal respiratory effort, no intercostal retractions, no accessory muscle use. Heart: RRR,  no murmur.  Lower extremities: no pretibial edema bilaterally  Skin: Not pale. Not jaundice Neurologic:  alert & oriented X3.  Speech normal, gait appropriate for age and unassisted Psych--  Cognition and judgment appear intact.  Cooperative with normal attention span and concentration.  Behavior appropriate. No anxious or depressed appearing.      Assessment    Assessment DM Hyperlipidemia Bronchospasm (on Qvar) GI: --h/o PUD (EGD @ [facility]  2002: neg  but reports had another EGD with ulcer) --GERD, IBS, occ constipation, gastroparesis (+ gastric emptying study October 2020) -- fatty liver per u/s 06-2015 -EGD  08-2017: Gastritis, polyps, benign findings per GI letter, no follow-up OSA:  (-)  home sleep apnea test 2014,  Full sleep study~08-2015 : mild OSA, saw pulmonary, rx dental appliance, wt loss HST 08-2018: Rx CPAP - DOE x years>>>>  neg stress test 03/2015 Abnormal brain MRI 05-2020 (subsequently saw neurosurgery, recommend observation, per patient, no documentation)  PLAN URI versus allergies?. Continue Astepro, add Flonase. Switch Zyrtec to Allegra temporarily Mucinex as needed Call if not gradually better Bronchospasm: Currently well on Pulmicort. Dyslipidemia: Fenofibrate was added to atorvastatin at the last visit,  good compliance and tolerance RTC scheduled for November

## 2022-07-16 NOTE — Patient Instructions (Addendum)
Drink plenty fluids  Continue Astepro nasal spray twice daily  Start Flonase nasal spray: 2 sprays on each side of the nose daily  You can switch temporarily Zyrtec to Allegra over-the-counter: 180 mg daily.  Okay to use over-the-counter Mucinex for few days  Call if not gradually better   Once better vaccines I recommend: Covid booster Flu shot  See you in few weeks for your physical  Per our records you are due for your diabetic eye exam. Please contact your eye doctor to schedule an appointment. Please have them send copies of your office visit notes to [place]. Our fax number is [phone]. If you need a referral to an eye doctor please let us know.

## 2022-07-17 ENCOUNTER — Other Ambulatory Visit: Payer: Self-pay | Admitting: Gastroenterology

## 2022-07-18 ENCOUNTER — Ambulatory Visit
Admission: EM | Admit: 2022-07-18 | Discharge: 2022-07-18 | Disposition: A | Discharge status: Home / Self Care | Payer: 59 | Attending: Urgent Care | Admitting: Urgent Care

## 2022-07-18 DIAGNOSIS — H6121 Impacted cerumen, right ear: Secondary | ICD-10-CM

## 2022-07-18 MED ORDER — CARBAMIDE PEROXIDE 6.5 % OT SOLN: 5.0000 [drp] | Freq: Every day | OTIC | 0 refills | Status: DC | PRN

## 2022-07-18 NOTE — ED Provider Notes (Signed)
[facility]  Note:  This document was prepared using Systems analyst and may include unintentional dictation errors.  MRN: [id number] DOB: 1971/02/07  Subjective:   Wayne Melton is a 51 y.o. male presenting for persistent right ear fullness.  Patient was seen by his PCP 2 days ago and was told he had a small amount of earwax.  They tried to remove this but were not completely successful.  No sign of infection of the eardrum.  Wanted to get rechecked.  Has a history of difficulty with earwax buildup.  No current facility-administered medications for this encounter.  Current Outpatient Medications:    atorvastatin (LIPITOR) 20 MG tablet, TAKE 1 TABLET BY MOUTH EVERYDAY AT BEDTIME, Disp: 90 tablet, Rfl: 1   budesonide (PULMICORT) 180 MCG/ACT inhaler, Inhale 1 puff into the lungs 2 (two) times daily., Disp: 1 each, Rfl: 6   fenofibrate (TRICOR) 145 MG tablet, Take 1 tablet (145 mg total) by mouth daily., Disp: 90 tablet, Rfl: 2   JARDIANCE 10 MG TABS tablet, TAKE 1 TABLET BY MOUTH EVERY DAY BEFORE BREAKFAST, Disp: 90 tablet, Rfl: 1   metFORMIN (GLUCOPHAGE-XR) 750 MG 24 hr tablet, TAKE 2 TABLETS (1,500 MG TOTAL) BY MOUTH EVERY DAY WITH BREAKFAST, Disp: 180 tablet, Rfl: 1   metoCLOPramide (REGLAN) 5 MG tablet, TAKE 1 TABLET BY MOUTH 2 TO 3 TIMES A DAY AS NEEDED, Disp: 60 tablet, Rfl: 3   omeprazole (PRILOSEC) 40 MG capsule, TAKE 1 CAPSULE BY MOUTH TWICE A DAY, Disp: 180 capsule, Rfl: 0   acetaminophen (TYLENOL) 500 MG tablet, Take 1,000 mg by mouth every 6 (six) hours as needed for moderate pain., Disp: , Rfl:    clobetasol cream (TEMOVATE) 9.37 %, Apply 1 Application topically 2 (two) times daily., Disp: 45 g, Rfl: 0   fluticasone (FLONASE) 50 MCG/ACT nasal spray, Place into the nose., Disp: , Rfl:    Hyoscyamine Sulfate SL (LEVSIN/SL) 0.125 MG SUBL, Place 0.125 mg under the tongue every 4 (four) hours as needed (abd pain)., Disp: 20 tablet, Rfl: 0    polyethylene glycol (MIRALAX) 17 g packet, Take 17 g by mouth daily., Disp: 14 each, Rfl: 0   No Known Allergies  Past Medical History:  Diagnosis Date   Allergic rhinitis 07/05/2012   Constipation    chronic   Diabetes mellitus without complication (HCC)    No meds   Fatty liver disease, nonalcoholic    Gastroparesis    GERD , HH, h/o PUD 07/05/2012   Hyperlipidemia    IBS (irritable bowel syndrome) 11/06/2012   Obesity    OSA (obstructive sleep apnea)    mild   Seasonal allergies    Seasonal allergies    Sleep apnea      Past Surgical History:  Procedure Laterality Date   COLONOSCOPY     UPPER GASTROINTESTINAL ENDOSCOPY      Family History  Problem Relation Age of Onset   Pancreatitis Mother    Diabetes Mother    Hyperlipidemia Mother    Colon polyps Mother        late 66s   Mitral valve prolapse Mother    Colon cancer Maternal Uncle        uncle in his 99s   Epilepsy Father    Crohn's disease Brother    Colon cancer Maternal Grandfather    CAD Other        GF in his 11s   Irritable bowel syndrome Other  Colitis Other    Heart disease Other    Prostate cancer Neg Hx     Social History   Tobacco Use   Smoking status: Former    Packs/day: 0.50    Types: Cigarettes    Start date: 2007   Smokeless tobacco: Never   Tobacco comments:    On-off quit, currently not smoking 08-2021  Vaping Use   Vaping Use: Never used  Substance Use Topics   Alcohol use: Yes    Comment: rare   Drug use: Never    ROS   Objective:   Vitals: BP 113/81   Pulse 69   Temp (!) 97.5 F (36.4 C) (Oral)   Resp 20   SpO2 95%   Physical Exam Constitutional:      General: He is not in acute distress.    Appearance: Normal appearance. He is well-developed and normal weight. He is not ill-appearing, toxic-appearing or diaphoretic.  HENT:     Head: Normocephalic and atraumatic.     Right Ear: Tympanic membrane, ear canal and external ear normal. There is impacted  cerumen.     Left Ear: Tympanic membrane, ear canal and external ear normal. There is no impacted cerumen.     Nose: Nose normal.     Mouth/Throat:     Pharynx: Oropharynx is clear.  Eyes:     General: No scleral icterus.       Right eye: No discharge.        Left eye: No discharge.     Extraocular Movements: Extraocular movements intact.  Cardiovascular:     Rate and Rhythm: Normal rate.  Pulmonary:     Effort: Pulmonary effort is normal.  Musculoskeletal:     Cervical back: Normal range of motion.  Neurological:     Mental Status: He is alert and oriented to person, place, and time.  Psychiatric:        Mood and Affect: Mood normal.        Behavior: Behavior normal.        Thought Content: Thought content normal.        Judgment: Judgment normal.    Ear lavage performed using mixture of peroxide and water.  Pressure irrigation performed using a bottle and a thin ear tube.  Right ear lavage.  No curette was used.   Assessment and Plan :   PDMP not reviewed this encounter.  1. Impacted cerumen of right ear     Successful right ear lavage.  General management of cerumen impaction reviewed with patient.  Anticipatory guidance provided. Counseled patient on potential for adverse effects with medications prescribed/recommended today, ER and return-to-clinic precautions discussed, patient verbalized understanding.    Jaynee Eagles, PA-C 07/18/22 1234

## 2022-07-18 NOTE — Assessment & Plan Note (Signed)
URI versus allergies?. Continue Astepro, add Flonase. Switch Zyrtec to Allegra temporarily Mucinex as needed Call if not gradually better Bronchospasm: Currently well on Pulmicort. Dyslipidemia: Fenofibrate was added to atorvastatin at the last visit, good compliance and tolerance RTC scheduled for November

## 2022-07-18 NOTE — ED Triage Notes (Addendum)
C/O bilat ear soreness x approx 2 wks; right ear feels clogged with decreased hearing since yesterday. Denies any fevers. C/O nasal drainage. Saw PCP 2 days ago for ear pain - states a small amt cerumen was removed from right ear, but was told no sign infection, and to get recheck if pain continued.

## 2022-08-01 ENCOUNTER — Other Ambulatory Visit: Payer: Self-pay | Admitting: Gastroenterology

## 2022-08-13 ENCOUNTER — Encounter: Payer: Self-pay | Admitting: Internal Medicine

## 2022-08-13 ENCOUNTER — Ambulatory Visit (INDEPENDENT_AMBULATORY_CARE_PROVIDER_SITE_OTHER): Payer: 59 | Admitting: Internal Medicine

## 2022-08-13 VITALS — BP 132/68 | HR 69 | Temp 97.8°F | Resp 18 | Ht 72.0 in | Wt 256.5 lb

## 2022-08-13 DIAGNOSIS — E785 Hyperlipidemia, unspecified: Secondary | ICD-10-CM | POA: Diagnosis not present

## 2022-08-13 DIAGNOSIS — E119 Type 2 diabetes mellitus without complications: Secondary | ICD-10-CM | POA: Diagnosis not present

## 2022-08-13 DIAGNOSIS — G4733 Obstructive sleep apnea (adult) (pediatric): Secondary | ICD-10-CM

## 2022-08-13 DIAGNOSIS — Z0000 Encounter for general adult medical examination without abnormal findings: Secondary | ICD-10-CM | POA: Diagnosis not present

## 2022-08-13 DIAGNOSIS — Z23 Encounter for immunization: Secondary | ICD-10-CM

## 2022-08-13 LAB — LIPID PANEL
Cholesterol: 198 mg/dL (ref 0–200)
HDL: 33.1 mg/dL — ABNORMAL LOW (ref >39.00)
NonHDL: 164.72
Total CHOL/HDL Ratio: 6
Triglycerides: 319 mg/dL — ABNORMAL HIGH (ref 0.0–149.0)
VLDL: 63.8 mg/dL — ABNORMAL HIGH (ref 0.0–40.0)

## 2022-08-13 LAB — CBC WITH DIFFERENTIAL/PLATELET
Basophils Absolute: 0 10*3/uL (ref 0.0–0.1)
Basophils Relative: 0.6 % (ref 0.0–3.0)
Eosinophils Absolute: 0 10*3/uL (ref 0.0–0.7)
Eosinophils Relative: 0.5 % (ref 0.0–5.0)
HCT: 42.8 % (ref 39.0–52.0)
Hemoglobin: 14.5 g/dL (ref 13.0–17.0)
Lymphocytes Relative: 26.2 % (ref 12.0–46.0)
Lymphs Abs: 2.1 10*3/uL (ref 0.7–4.0)
MCHC: 33.8 g/dL (ref 30.0–36.0)
MCV: 88.3 fl (ref 78.0–100.0)
Monocytes Absolute: 0.4 10*3/uL (ref 0.1–1.0)
Monocytes Relative: 5.2 % (ref 3.0–12.0)
Neutro Abs: 5.5 10*3/uL (ref 1.4–7.7)
Neutrophils Relative %: 67.5 % (ref 43.0–77.0)
Platelets: 348 10*3/uL (ref 150.0–400.0)
RBC: 4.85 Mil/uL (ref 4.22–5.81)
RDW: 13.1 % (ref 11.5–15.5)
WBC: 8.1 10*3/uL (ref 4.0–10.5)

## 2022-08-13 LAB — COMPREHENSIVE METABOLIC PANEL
ALT: 42 U/L (ref 0–53)
AST: 12 U/L (ref 0–37)
Albumin: 4.5 g/dL (ref 3.5–5.2)
Alkaline Phosphatase: 56 U/L (ref 39–117)
BUN: 15 mg/dL (ref 6–23)
CO2: 29 mEq/L (ref 19–32)
Calcium: 9.3 mg/dL (ref 8.4–10.5)
Chloride: 102 mEq/L (ref 96–112)
Creatinine, Ser: 1.06 mg/dL (ref 0.40–1.50)
GFR: 82.64 mL/min (ref >60.00)
Glucose, Bld: 86 mg/dL (ref 70–99)
Potassium: 4.1 mEq/L (ref 3.5–5.1)
Sodium: 138 mEq/L (ref 135–145)
Total Bilirubin: 0.5 mg/dL (ref 0.2–1.2)
Total Protein: 6.9 g/dL (ref 6.0–8.3)

## 2022-08-13 LAB — LDL CHOLESTEROL, DIRECT: Direct LDL: 129 mg/dL

## 2022-08-13 LAB — HEMOGLOBIN A1C: Hgb A1c MFr Bld: 7.3 % — ABNORMAL HIGH (ref 4.6–6.5)

## 2022-08-13 NOTE — Progress Notes (Unsigned)
Subjective:    Patient ID: Wayne Melton, male    DOB: Jun 06, 1971, 51 y.o.   MRN: [id number]  DOS:  08/13/2022 he Type of visit - description: cpx Here for CPX No new concerns. Occasional GERD symptoms dyspepsia.  No weight loss or blood in the stools. Occasional night sweats.     Wt Readings from Last 3 Encounters:  08/13/22 256 lb 8 oz (116.3 kg)  07/16/22 258 lb 8 oz (117.3 kg)  02/10/22 256 lb 2 oz (116.2 kg)     Review of Systems See above   Past Medical History:  Diagnosis Date   Allergic rhinitis 07/05/2012   Constipation    chronic   Diabetes mellitus without complication (HCC)    No meds   Fatty liver disease, nonalcoholic    Gastroparesis    GERD , HH, h/o PUD 07/05/2012   Hyperlipidemia    IBS (irritable bowel syndrome) 11/06/2012   Obesity    OSA (obstructive sleep apnea)    mild   Seasonal allergies    Seasonal allergies    Sleep apnea     Past Surgical History:  Procedure Laterality Date   COLONOSCOPY     UPPER GASTROINTESTINAL ENDOSCOPY      Current Outpatient Medications  Medication Instructions   acetaminophen (TYLENOL) 1,000 mg, Oral, Every 6 hours PRN   atorvastatin (LIPITOR) 20 MG tablet TAKE 1 TABLET BY MOUTH EVERYDAY AT BEDTIME   budesonide (PULMICORT) 180 MCG/ACT inhaler 1 puff, Inhalation, 2 times daily   carbamide peroxide (DEBROX) 6.5 % OTIC solution 5 drops, Both EARS, Daily PRN   clobetasol cream (TEMOVATE) 2.70 % 1 Application, Topical, 2 times daily   fenofibrate (TRICOR) 145 mg, Oral, Daily   fluticasone (FLONASE) 50 MCG/ACT nasal spray Nasal   Hyoscyamine Sulfate SL (LEVSIN/SL) 0.125 mg, Sublingual, Every 4 hours PRN   JARDIANCE 10 MG TABS tablet TAKE 1 TABLET BY MOUTH EVERY DAY BEFORE BREAKFAST   metFORMIN (GLUCOPHAGE-XR) 750 MG 24 hr tablet TAKE 2 TABLETS (1,500 MG TOTAL) BY MOUTH EVERY DAY WITH BREAKFAST   metoCLOPramide (REGLAN) 5 MG tablet TAKE 1 TABLET BY MOUTH 2 TO 3 TIMES A DAY AS NEEDED   omeprazole  (PRILOSEC) 40 mg, Oral, 2 times daily, Please schedule an office visit for further refills. Thank you   polyethylene glycol (MIRALAX) 17 g, Oral, Daily       Objective:   Physical Exam BP 132/68   Pulse 69   Temp 97.8 F (36.6 C) (Oral)   Resp 18   Ht 6' (1.829 m)   Wt 256 lb 8 oz (116.3 kg)   SpO2 97%   BMI 34.79 kg/m  General: Well developed, NAD, BMI noted Neck: No  thyromegaly.  No supraclavicular mass or lymph nodes. HEENT:  Normocephalic . Face symmetric, atraumatic Lungs:  CTA B Normal respiratory effort, no intercostal retractions, no accessory muscle use. Heart: RRR,  no murmur.  Abdomen:  Not distended, soft, non-tender. No rebound or rigidity.   Lower extremities: no pretibial edema bilaterally  Skin: Exposed areas without rash. Not pale. Not jaundice Neurologic:  alert & oriented X3.  Speech normal, gait appropriate for age and unassisted Strength symmetric and appropriate for age.  Psych: Cognition and judgment appear intact.  Cooperative with normal attention span and concentration.  Behavior appropriate. No anxious or depressed appearing.     Assessment     Assessment DM Hyperlipidemia Bronchospasm (on Qvar) GI: --h/o PUD (EGD @ [facility]  2002: neg  but  reports had another EGD with ulcer) --GERD, IBS, occ constipation, gastroparesis (+ gastric emptying study October 2020) -- fatty liver per u/s 06-2015 -EGD 08-2017: Gastritis, polyps, benign findings per GI letter, no follow-up OSA:  (-)  home sleep apnea test 2014,  Full sleep study~08-2015 : mild OSA, saw pulmonary, rx dental appliance, wt loss HST 08-2018: Rx CPAP - DOE x years>>>>  neg stress test 03/2015 Abnormal brain MRI 05-2020 (subsequently saw neurosurgery, recommend observation, per patient, no documentation)  PLAN Here for CPX DM: On metformin, Jardiance.  Last A1c 6.9.  Checking labs, long discussion about diet (recommend calorie counting for 1 week to see if he is overeating) and  exercise regularly.  Check labs. Hyperlipidemia: Based on last FLP, Tricor was added to Lipitor.  Good compliance.  Check labs. Night sweats: On and off, no weight loss.  No fevers per se.  Last year a with the day blood smear which was negative.  Rec observation. OSA: Reports good compliance with CPAP, still he feels very tired.  Refer to pulmonary, it is time for a recheck. RTC 4 months.  -Td 2016 -  pnm shot: 2016; prevnar 2017 - covid shot: rec booster, benefits d/w pt, states he will decline. - flu shot today -CCS: +FH   colon polyps (mother, early 48s ) and colon cancer ( uncle, mid 43s); cscope 12-2015, Cscope 07-2021, next per GI  -Diet and exercise: Extensive discussion, see comment under  DM - labs: CMP FLP CBC A1c      URI versus allergies?. Continue Astepro, add Flonase. Switch Zyrtec to Allegra temporarily Mucinex as needed Call if not gradually better Bronchospasm: Currently well on Pulmicort. Dyslipidemia: Fenofibrate was added to atorvastatin at the last visit, good compliance and tolerance RTC scheduled for November

## 2022-08-13 NOTE — Patient Instructions (Addendum)
Per our records you are due for your diabetic eye exam. Please contact your eye doctor to schedule an appointment. Please have them send copies of your office visit notes to [place]. Our fax number is [phone]. If you need a referral to an eye doctor please let us know.  Watch your diet closely.  Consider calorie counting for 1 week to see the amount of calories you are normally taking. Try to exercise regularly    GO TO THE LAB : Get the blood work     GO TO THE FRONT DESK, [place] back for a checkup in 4 months

## 2022-08-15 ENCOUNTER — Encounter: Payer: Self-pay | Admitting: Internal Medicine

## 2022-08-15 NOTE — Assessment & Plan Note (Signed)
-  Td 2016 -  pnm shot: 2016; prevnar 2017 - covid shot: rec booster, benefits d/w pt, states he will decline. - flu shot today -CCS: +FH   colon polyps (mother, early 58s ) and colon cancer ( uncle, mid 23s); cscope 12-2015, Cscope 07-2021, next per GI  -Diet and exercise: Extensive discussion, see comment under  DM - labs: CMP FLP CBC A1c

## 2022-08-15 NOTE — Assessment & Plan Note (Signed)
Here for CPX DM: On metformin, Jardiance.  Last A1c 6.9.  Checking labs, long discussion about diet (recommend calorie counting for 1 week to see if he is overeating) and exercise regularly.  Check labs. Hyperlipidemia: Based on last FLP, Tricor was added to Lipitor.  Good compliance.  Check labs. Night sweats: On and off, no weight loss.  No fevers per se.  Last year a  blood smear which was negative.  Rec observation. OSA: Reports good compliance with CPAP, still he feels very tired.  Refer to pulmonary, adjust cpap settings? RTC 4 months.

## 2022-08-17 MED ORDER — EMPAGLIFLOZIN 25 MG PO TABS: 25.0000 mg | ORAL_TABLET | Freq: Every day | ORAL | 1 refills | Status: DC

## 2022-08-17 NOTE — Addendum Note (Signed)
Addended byDamita Dunnings D on: 08/17/2022 01:46 PM   Modules accepted: Orders

## 2022-08-23 ENCOUNTER — Encounter: Payer: Self-pay | Admitting: Internal Medicine

## 2022-09-12 ENCOUNTER — Other Ambulatory Visit: Payer: Self-pay | Admitting: Internal Medicine

## 2022-10-01 ENCOUNTER — Institutional Professional Consult (permissible substitution) (HOSPITAL_BASED_OUTPATIENT_CLINIC_OR_DEPARTMENT_OTHER): Payer: 59 | Admitting: Pulmonary Disease

## 2022-10-09 ENCOUNTER — Other Ambulatory Visit: Payer: Self-pay | Admitting: Internal Medicine

## 2022-10-15 ENCOUNTER — Other Ambulatory Visit: Payer: Self-pay | Admitting: Gastroenterology

## 2022-10-25 ENCOUNTER — Telehealth: Payer: Self-pay | Admitting: Gastroenterology

## 2022-10-25 MED ORDER — METOCLOPRAMIDE HCL 5 MG PO TABS: ORAL_TABLET | ORAL | 1 refills | Status: DC

## 2022-10-25 NOTE — Telephone Encounter (Signed)
Inbound call from patient stating he needs a refill for Reglan. Patient was scheduled for OV with Dr. Havery Moros on 3/15 at 9:00 and is requesting refill to be sent. Please advise.

## 2022-10-25 NOTE — Telephone Encounter (Signed)
Refill sent to pharmacy. Patient needs to keep March appointment for further refills

## 2022-10-27 ENCOUNTER — Institutional Professional Consult (permissible substitution) (HOSPITAL_BASED_OUTPATIENT_CLINIC_OR_DEPARTMENT_OTHER): Payer: 59 | Admitting: Pulmonary Disease

## 2022-11-05 ENCOUNTER — Ambulatory Visit (INDEPENDENT_AMBULATORY_CARE_PROVIDER_SITE_OTHER): Payer: 59 | Admitting: Nurse Practitioner

## 2022-11-05 ENCOUNTER — Encounter: Payer: Self-pay | Admitting: Nurse Practitioner

## 2022-11-05 VITALS — BP 112/82 | HR 84 | Temp 98.2°F | Ht 71.0 in | Wt 252.2 lb

## 2022-11-05 DIAGNOSIS — G4733 Obstructive sleep apnea (adult) (pediatric): Secondary | ICD-10-CM | POA: Diagnosis not present

## 2022-11-05 DIAGNOSIS — G479 Sleep disorder, unspecified: Secondary | ICD-10-CM | POA: Diagnosis not present

## 2022-11-05 NOTE — Patient Instructions (Signed)
Continue to use CPAP every night, minimum of 4-6 hours a night.  Change equipment every 30 days or as directed by DME. Wash your tubing with warm soap and water daily, hang to dry. Wash humidifier portion weekly.  Be aware of reduced alertness and do not drive or operate heavy machinery if experiencing this or drowsiness.  Exercise encouraged, as tolerated. Notify if persistent daytime sleepiness occurs even with consistent use of CPAP.  Sleep apnea appears well controlled on CPAP.  If you decide you would like to try something for sleep, please let me know.  Follow up in one year or sooner, if needed

## 2022-11-05 NOTE — Progress Notes (Signed)
$'[name]$  ID: Wayne Melton, male    DOB: 15-Jan-1971, 52 y.o.   MRN: [id number]  Chief Complaint  Patient presents with   Consult    Pt states states it has been about 4 years since he had a follow up with his CPAP. Pt states he is still waking in the middle of the night.     Referring provider: Colon Branch, MD  HPI: 52 year old male, former smoker referred for sleep consult/re-establish care. He is a former patient of Dr. Juanetta Gosling and last seen in 2019. Past medical history significant for allergic rhinitis, OSA on CPAP, GERD, IBS, DM, HLD.   TEST/EVENTS:  08/10/2018 HST: AHI 13.9/h, SpO2 low 76%  11/05/2022: Today - sleep consult Patient presents today for sleep consult by Dr. Larose Kells. He has been on CPAP for many years now. Last seen in our office in 2019. He would like to re-establish care here. He has been having some trouble with his nighttime sleep for the past few months. Wakes around 3-4 AM and has a lot of trouble getting back to sleep. He also occasionally wakes up sweating. He's tried melatonin 10 mg but hasn't had much success with this. He does feel more tired during the day with this. He's worried it may be related to his CPAP not working properly; although, he doesn't have any issues with the machine to correlate this to. He does have some life stressors in his job. He denies morning headaches, snoring, dry mouth, drowsy driving, sleep parasomnias/paralysis. No history of narcolepsy or cataplexy. Goes to bed around 10-11 pm. Falls asleep within 20-30 min. Wakes 4-5 times a night; extended period around 3-4 AM. Gets up at 6:30-6:45 AM. He is on CPAP 5-15 cmH2O with large full face mask. Fits well without significant leaks. He doesn't have a SD card but does have download available on the app on his phone. He has gained 10-15 lb in the last two years.  History of mild asthma on pulmicort and well controlled. He has diabetes which is controlled with metformin. No history of stroke. He is  a former smoker; quit 2022. Drinks alcohol 2-3 times a year. Drinks 2-3 cups of coffee a day; last cup around 1030 am. Lives with his wife and two sons. 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 Family history of seasonal allergies, asthma, and heart disease.   [facility] 3  10/05/2022-11/04/2022 CPAP 5-15 cmH2O  29/30 days; 96.7% >4 hr; av use 8 hours 24 min AHI on download <2.5/h  No significant leaks  No Known Allergies  Immunization History  Administered Date(s) Administered   Influenza,inj,Quad PF,6+ Mos 08/19/2015, 06/17/2016, 06/22/2017, 06/23/2018, 08/12/2021, 08/13/2022   Influenza-Unspecified 07/24/2020   PFIZER(Purple Top)SARS-COV-2 Vaccination 12/27/2019, 01/17/2020, 10/10/2020   Pneumococcal Conjugate-13 06/17/2016   Pneumococcal Polysaccharide-23 02/11/2015   Tdap 11/18/2008, 09/26/2015    Past Medical History:  Diagnosis Date   Allergic rhinitis 07/05/2012   Constipation    chronic   Diabetes mellitus without complication (HCC)    No meds   Fatty liver disease, nonalcoholic    Gastroparesis    GERD , HH, h/o PUD 07/05/2012   Hyperlipidemia    IBS (irritable bowel syndrome) 11/06/2012   Obesity    OSA (obstructive sleep apnea)    mild   Seasonal allergies    Seasonal allergies    Sleep apnea     Tobacco History: Social History   Tobacco Use  Smoking Status Former   Packs/day: 0.50  Types: Cigarettes   Start date: 2007  Smokeless Tobacco Never  Tobacco Comments   On-off quit, currently not smoking 08-2021   Counseling given: Not Answered Tobacco comments: On-off quit, currently not smoking 08-2021   Outpatient Medications Prior to Visit  Medication Sig Dispense Refill   acetaminophen (TYLENOL) 500 MG tablet Take 1,000 mg by mouth every 6 (six) hours as needed for moderate pain.     atorvastatin (LIPITOR) 20 MG tablet TAKE 1 TABLET BY MOUTH EVERYDAY AT BEDTIME 90 tablet 1   budesonide (PULMICORT) 180 MCG/ACT inhaler Inhale 1 puff into the  lungs 2 (two) times daily. 1 each 6   carbamide peroxide (DEBROX) 6.5 % OTIC solution Place 5 drops into both ears daily as needed. 15 mL 0   empagliflozin (JARDIANCE) 25 MG TABS tablet Take 1 tablet (25 mg total) by mouth daily before breakfast. 90 tablet 1   fenofibrate (TRICOR) 145 MG tablet Take 1 tablet (145 mg total) by mouth daily. 90 tablet 1   fluticasone (FLONASE) 50 MCG/ACT nasal spray Place into the nose.     metFORMIN (GLUCOPHAGE-XR) 750 MG 24 hr tablet TAKE 2 TABLETS (1,500 MG TOTAL) BY MOUTH EVERY DAY WITH BREAKFAST 180 tablet 1   metoCLOPramide (REGLAN) 5 MG tablet TAKE 1 TABLET BY MOUTH 2 TO 3 TIMES A DAY AS NEEDED. Please keep your March appointment for further refills. 60 tablet 1   omeprazole (PRILOSEC) 40 MG capsule Take 1 capsule (40 mg total) by mouth 2 (two) times daily. Please schedule an office visit for further refills. Thank you 180 capsule 1   polyethylene glycol (MIRALAX) 17 g packet Take 17 g by mouth daily. 14 each 0   No facility-administered medications prior to visit.     Review of Systems:   Constitutional: No weight loss or gain, night sweats, fevers, chills, or lassitude. +daytime fatigue HEENT: No headaches, difficulty swallowing, tooth/dental problems, or sore throat. No sneezing, itching, ear ache, nasal congestion, or post nasal drip CV:  No chest pain, orthopnea, PND, swelling in lower extremities, anasarca, dizziness, palpitations, syncope Resp: No shortness of breath with exertion or at rest. No excess mucus or change in color of mucus. No productive or non-productive. No hemoptysis. No wheezing.  No chest wall deformity GI:  No heartburn, indigestion, abdominal pain, nausea, vomiting, diarrhea, change in bowel habits, loss of appetite GU: No dysuria, change in color of urine, urgency or frequency.  Skin: No rash, lesions, ulcerations MSK:  No joint pain or swelling.  Neuro: No dizziness or lightheadedness.  Psych: No depression or anxiety. Mood  stable. +sleep disturbance    Physical Exam:  BP 112/82 (BP Location: Right Arm, Patient Position: Sitting, Cuff Size: Normal)   Pulse 84   Temp 98.2 F (36.8 C) (Oral)   Ht '5\' 11"'$  (1.803 m)   Wt 252 lb 3.2 oz (114.4 kg)   SpO2 99%   BMI 35.17 kg/m   GEN: Pleasant, interactive, well-appearing; obese; in no acute distress. HEENT:  Normocephalic and atraumatic. PERRLA. Sclera white. Nasal turbinates pink, moist and patent bilaterally. No rhinorrhea present. Oropharynx pink and moist, without exudate or edema. No lesions, ulcerations, or postnasal drip. Mallampati III NECK:  Supple w/ fair ROM. No JVD present. Normal carotid impulses w/o bruits. Thyroid symmetrical with no goiter or nodules palpated. No lymphadenopathy.   CV: RRR, no m/r/g, no peripheral edema. Pulses intact, +2 bilaterally. No cyanosis, pallor or clubbing. PULMONARY:  Unlabored, regular breathing. Clear bilaterally A&P w/o wheezes/rales/rhonchi. No  accessory muscle use.  GI: BS present and normoactive. Soft, non-tender to palpation. No organomegaly or masses detected.  MSK: No erythema, warmth or tenderness. Cap refil <2 sec all extrem. No deformities or joint swelling noted.  Neuro: A/Ox3. No focal deficits noted.   Skin: Warm, no lesions or rashe Psych: Normal affect and behavior. Judgement and thought content appropriate.     Lab Results:  CBC    Component Value Date/Time   WBC 8.1 08/13/2022 1316   RBC 4.85 08/13/2022 1316   HGB 14.5 08/13/2022 1316   HCT 42.8 08/13/2022 1316   PLT 348.0 08/13/2022 1316   MCV 88.3 08/13/2022 1316   MCH 30.2 04/14/2021 0209   MCHC 33.8 08/13/2022 1316   RDW 13.1 08/13/2022 1316   LYMPHSABS 2.1 08/13/2022 1316   MONOABS 0.4 08/13/2022 1316   EOSABS 0.0 08/13/2022 1316   BASOSABS 0.0 08/13/2022 1316    BMET    Component Value Date/Time   NA 138 08/13/2022 1316   K 4.1 08/13/2022 1316   CL 102 08/13/2022 1316   CO2 29 08/13/2022 1316   GLUCOSE 86 08/13/2022 1316    BUN 15 08/13/2022 1316   CREATININE 1.06 08/13/2022 1316   CREATININE 0.89 12/19/2020 1543   CALCIUM 9.3 08/13/2022 1316   GFRNONAA >60 04/14/2021 0209   GFRAA >60 05/10/2020 1138    BNP No results found for: "BNP"   Imaging:  No results found.        No data to display          No results found for: "NITRICOXIDE"      Assessment & Plan:   OSA (obstructive sleep apnea) Mild to moderate OSA on CPAP therapy. He has excellent compliance and control on download. Sleep maintenance issues do not appear to be related to breakthrough events or leaks. Encouraged him to continue nightly use. He receives good benefit from use. Due for a new machine next year.   Patient Instructions  Continue to use CPAP every night, minimum of 4-6 hours a night.  Change equipment every 30 days or as directed by DME. Wash your tubing with warm soap and water daily, hang to dry. Wash humidifier portion weekly.  Be aware of reduced alertness and do not drive or operate heavy machinery if experiencing this or drowsiness.  Exercise encouraged, as tolerated. Notify if persistent daytime sleepiness occurs even with consistent use of CPAP.  Sleep apnea appears well controlled on CPAP.  If you decide you would like to try something for sleep, please let me know.  Follow up in one year or sooner, if needed    Sleep disturbance Issues with sleep maintenance. Possibly related to life stressors. He has tried OTC melatonin and sleep aid without much success. We reviewed pharmacological therapy options today. He decided against this and would like to avoid further medications, if possible. He was happy to hear it didn't seem to be related to issues with his OSA/CPAP. Sleep hygiene measures reviewed. He will let us know if he changes his mind in the future.    I spent 40 minutes of dedicated to the care of this patient on the date of this encounter to include pre-visit review of records, face-to-face  time with the patient discussing conditions above, post visit ordering of testing, clinical documentation with the electronic health record, making appropriate referrals as documented, and communicating necessary findings to members of the patients care team.  Clayton Bibles, NP 11/05/2022  Pt aware and understands  NP's role.

## 2022-11-05 NOTE — Assessment & Plan Note (Signed)
Mild to moderate OSA on CPAP therapy. He has excellent compliance and control on download. Sleep maintenance issues do not appear to be related to breakthrough events or leaks. Encouraged him to continue nightly use. He receives good benefit from use. Due for a new machine next year.   Patient Instructions  Continue to use CPAP every night, minimum of 4-6 hours a night.  Change equipment every 30 days or as directed by DME. Wash your tubing with warm soap and water daily, hang to dry. Wash humidifier portion weekly.  Be aware of reduced alertness and do not drive or operate heavy machinery if experiencing this or drowsiness.  Exercise encouraged, as tolerated. Notify if persistent daytime sleepiness occurs even with consistent use of CPAP.  Sleep apnea appears well controlled on CPAP.  If you decide you would like to try something for sleep, please let me know.  Follow up in one year or sooner, if needed

## 2022-11-05 NOTE — Assessment & Plan Note (Addendum)
Issues with sleep maintenance. Possibly related to life stressors. He has tried OTC melatonin and sleep aid without much success. We reviewed pharmacological therapy options today. He decided against this and would like to avoid further medications, if possible. He was happy to hear it didn't seem to be related to issues with his OSA/CPAP. Sleep hygiene measures reviewed. He will let us know if he changes his mind in the future.

## 2022-11-06 NOTE — Progress Notes (Signed)
Reviewed and agree with assessment/plan.   Chesley Mires, MD [facility]/Critical [facility] 11/06/2022, 4:16 PM Pager:  [phone]

## 2022-12-17 ENCOUNTER — Ambulatory Visit (INDEPENDENT_AMBULATORY_CARE_PROVIDER_SITE_OTHER): Payer: 59 | Admitting: Gastroenterology

## 2022-12-17 ENCOUNTER — Encounter: Payer: Self-pay | Admitting: Internal Medicine

## 2022-12-17 ENCOUNTER — Ambulatory Visit (INDEPENDENT_AMBULATORY_CARE_PROVIDER_SITE_OTHER): Payer: 59 | Admitting: Internal Medicine

## 2022-12-17 ENCOUNTER — Encounter: Payer: Self-pay | Admitting: Gastroenterology

## 2022-12-17 VITALS — BP 132/68 | HR 79 | Ht 71.0 in | Wt 247.0 lb

## 2022-12-17 VITALS — BP 130/68 | HR 69 | Temp 97.8°F | Resp 16 | Ht 71.0 in | Wt 247.0 lb

## 2022-12-17 DIAGNOSIS — M25512 Pain in left shoulder: Secondary | ICD-10-CM

## 2022-12-17 DIAGNOSIS — G4733 Obstructive sleep apnea (adult) (pediatric): Secondary | ICD-10-CM | POA: Diagnosis not present

## 2022-12-17 DIAGNOSIS — R1310 Dysphagia, unspecified: Secondary | ICD-10-CM

## 2022-12-17 DIAGNOSIS — K3184 Gastroparesis: Secondary | ICD-10-CM

## 2022-12-17 DIAGNOSIS — E119 Type 2 diabetes mellitus without complications: Secondary | ICD-10-CM | POA: Diagnosis not present

## 2022-12-17 DIAGNOSIS — K219 Gastro-esophageal reflux disease without esophagitis: Secondary | ICD-10-CM | POA: Diagnosis not present

## 2022-12-17 DIAGNOSIS — K760 Fatty (change of) liver, not elsewhere classified: Secondary | ICD-10-CM

## 2022-12-17 DIAGNOSIS — E785 Hyperlipidemia, unspecified: Secondary | ICD-10-CM | POA: Diagnosis not present

## 2022-12-17 LAB — BASIC METABOLIC PANEL
BUN: 18 mg/dL (ref 6–23)
CO2: 25 mEq/L (ref 19–32)
Calcium: 9.5 mg/dL (ref 8.4–10.5)
Chloride: 102 mEq/L (ref 96–112)
Creatinine, Ser: 1.15 mg/dL (ref 0.40–1.50)
GFR: 74.76 mL/min (ref >60.00)
Glucose, Bld: 85 mg/dL (ref 70–99)
Potassium: 3.9 mEq/L (ref 3.5–5.1)
Sodium: 138 mEq/L (ref 135–145)

## 2022-12-17 LAB — MICROALBUMIN / CREATININE URINE RATIO
Creatinine,U: 109 mg/dL
Microalb Creat Ratio: 0.6 mg/g (ref 0.0–30.0)
Microalb, Ur: <0.7 mg/dL (ref 0.0–1.9)

## 2022-12-17 LAB — LIPID PANEL
Cholesterol: 191 mg/dL (ref 0–200)
HDL: 31.6 mg/dL — ABNORMAL LOW (ref >39.00)
NonHDL: 159.87
Total CHOL/HDL Ratio: 6
Triglycerides: 343 mg/dL — ABNORMAL HIGH (ref 0.0–149.0)
VLDL: 68.6 mg/dL — ABNORMAL HIGH (ref 0.0–40.0)

## 2022-12-17 LAB — HEMOGLOBIN A1C: Hgb A1c MFr Bld: 7 % — ABNORMAL HIGH (ref 4.6–6.5)

## 2022-12-17 LAB — LDL CHOLESTEROL, DIRECT: Direct LDL: 114 mg/dL

## 2022-12-17 MED ORDER — OMEPRAZOLE 40 MG PO CPDR: 40.0000 mg | DELAYED_RELEASE_CAPSULE | Freq: Two times a day (BID) | ORAL | 3 refills | Status: DC

## 2022-12-17 MED ORDER — METOCLOPRAMIDE HCL 5 MG PO TABS: 5.0000 mg | ORAL_TABLET | Freq: Two times a day (BID) | ORAL | 3 refills | Status: DC

## 2022-12-17 NOTE — Patient Instructions (Addendum)
  We have sent the following medications to your pharmacy for you to pick up at your convenience: [place]  Please follow up in a year   _______________________________________________________  If your blood pressure at your visit was 140/90 or greater, please contact your primary care physician to follow up on this.  _______________________________________________________  If you are age 50 or older, your body mass index should be between 23-30. Your Body mass index is 34.45 kg/m. If this is out of the aforementioned range listed, please consider follow up with your Primary Care Provider.  If you are age 51 or younger, your body mass index should be between 19-25. Your Body mass index is 34.45 kg/m. If this is out of the aformentioned range listed, please consider follow up with your Primary Care Provider.   ________________________________________________________  The [facility] providers would like to encourage you to use [facility] to communicate with providers for non-urgent requests or questions.  Due to long hold times on the telephone, sending your provider a message by [facility] may be a faster and more efficient way to get a response.  Please allow 48 business hours for a response.  Please remember that this is for non-urgent requests.  _______________________________________________________  It was a pleasure to see you today!  Thank you for trusting me with your gastrointestinal care!

## 2022-12-17 NOTE — Patient Instructions (Addendum)
Recommend to visit the American diabetes Association website or the [facility] website. They have very good information about diabetes, eating healthy, etc.   We are referring you to sports medicine doctor  GO TO THE LAB : Get the blood work     En[place], Lott back for   checkup in 4 months    Vaccines I recommend:  Covid booster   Per our records you are due for your diabetic eye exam. Please contact your eye doctor to schedule an appointment. Please have them send copies of your office visit notes to [place]. Our fax number is [phone]. If you need a referral to an eye doctor please let us know.  Please bring [place] a copy of your Healthcare Power of Attorney for your chart.

## 2022-12-17 NOTE — Progress Notes (Signed)
HPI :  52 year old male with a history of GERD, gastroparesis, colon polyps, fatty liver here for follow-up visit.  See prior notes for details of his case.  He has had a prior workup including EGD, CT scan, RUQ [place], GES.    I last saw him in October 2022 for his colonoscopy.  He had 1 small adenoma on that exam, no other concerning findings.  He has been doing pretty well since have last seen him.  He takes omeprazole 40 mg twice daily and has been on low-dose Reglan, on average 5 mg twice daily for the most part.  He feels well when he takes this regimen, does not really have much of any heartburn however if he reduce his dose of omeprazole he has not tolerated as well historically.  If he does not take his Reglan he feels symptoms of gastroparesis recurring.  He mentions over the past 7 to 8 months he has developed some occasional dysphagia.  This will only occur to solids and tends to feel it in his throat when it occurs.  He will need to drink some water to push the food through and then he feels okay.  This is not too frequent and is not significantly bothersome but certainly noticeable for him.  We discussed if he wanted to have this addressed with an upper endoscopy or not.  He had an EGD in 2018 which did not show any concerning findings, no Barrett's.  Dysphagia is new for him.  Otherwise we have been monitoring his liver enzymes with history of fatty liver.  His BMI is around 34, has been stable over time.  He does not drink any alcohol.  He drinks about 1 to 2 cups of coffee a day.  He is on metformin and Jardiance for diabetes.  Of note he states his mother and brother both have Crohn's disease, he inquires if there is any chance he could have that.  Prior CT scans have not shown any small bowel inflammation, his colonoscopy did not show any inflammatory changes.   Recall that his mother had colon polyps at age 80 and he has 2 second-degree relatives with colon cancer, and his brother  has Crohn's disease.      Prior workup: EGD 08/16/2017 - few small linear based ulcers, normal esophagus, multiple small gastric polyps, - benign biopsies Colonoscopy 12/09/15 - 54mm transverse TA, otherwise normal exam     US abdomen 10/23/18 - diffuse hepatic steatosis, Two hypoechoic liver masses which were not definitely seen on previous study, but are suspicious for areas of benign focal fatty sparing.   CT abdomen / pelvis - 11/04/18 - IMPRESSION: 1.  No acute intra-abdominal process. 2. Hepatic steatosis.   GES 08/09/19 -  Delayed gastric emptying study.  Findings suggest gastroparesis.   CT scan abdomen / pelvis with contrast 04/14/21: IMPRESSION: 1. No acute abdominopelvic process to provide cause for patient's symptoms. 2. Hepatic steatosis.   Colonoscopy 07/06/21: - The perianal and digital rectal examinations were normal. ' Findings: - The terminal ileum appeared normal. - A 3 mm polyp was found in the transverse colon. The polyp was sessile. The polyp was removed with a cold snare. Resection and retrieval were complete. - Internal hemorrhoids were found during retroflexion. - The exam was otherwise without abnormality.  Surgical [P], colon, transverse, polyp (1) - TUBULAR ADENOMA - NEGATIVE FOR HIGH-GRADE DYSPLASIA OR MALIGNANCY      Past Medical History:  Diagnosis Date   Allergic rhinitis  07/05/2012   Constipation    chronic   Diabetes mellitus without complication (HCC)    No meds   Fatty liver disease, nonalcoholic    Gastroparesis    GERD , HH, h/o PUD 07/05/2012   Hyperlipidemia    IBS (irritable bowel syndrome) 11/06/2012   Obesity    OSA (obstructive sleep apnea)    mild   Seasonal allergies    Seasonal allergies    Sleep apnea      Past Surgical History:  Procedure Laterality Date   COLONOSCOPY     UPPER GASTROINTESTINAL ENDOSCOPY     Family History  Problem Relation Age of Onset   Pancreatitis Mother    Diabetes Mother    Hyperlipidemia  Mother    Colon polyps Mother        late 85s   Mitral valve prolapse Mother    Colon cancer Maternal Uncle        uncle in his 42s   Epilepsy Father    Crohn's disease Brother    Colon cancer Maternal Grandfather    CAD Other        GF in his 72s   Irritable bowel syndrome Other    Colitis Other    Heart disease Other    Prostate cancer Neg Hx    Social History   Tobacco Use   Smoking status: Former    Packs/day: .5    Types: Cigarettes    Start date: 2007   Smokeless tobacco: Never   Tobacco comments:    On-off quit, currently not smoking 08-2021  Vaping Use   Vaping Use: Never used  Substance Use Topics   Alcohol use: Yes    Comment: rare   Drug use: Never   Current Outpatient Medications  Medication Sig Dispense Refill   acetaminophen (TYLENOL) 500 MG tablet Take 1,000 mg by mouth every 6 (six) hours as needed for moderate pain.     atorvastatin (LIPITOR) 20 MG tablet TAKE 1 TABLET BY MOUTH EVERYDAY AT BEDTIME 90 tablet 1   budesonide (PULMICORT) 180 MCG/ACT inhaler Inhale 1 puff into the lungs 2 (two) times daily. 1 each 6   carbamide peroxide (DEBROX) 6.5 % OTIC solution Place 5 drops into both ears daily as needed. 15 mL 0   empagliflozin (JARDIANCE) 25 MG TABS tablet Take 1 tablet (25 mg total) by mouth daily before breakfast. 90 tablet 1   fenofibrate (TRICOR) 145 MG tablet Take 1 tablet (145 mg total) by mouth daily. 90 tablet 1   fluticasone (FLONASE) 50 MCG/ACT nasal spray Place into the nose.     metFORMIN (GLUCOPHAGE-XR) 750 MG 24 hr tablet TAKE 2 TABLETS (1,500 MG TOTAL) BY MOUTH EVERY DAY WITH BREAKFAST 180 tablet 1   metoCLOPramide (REGLAN) 5 MG tablet TAKE 1 TABLET BY MOUTH 2 TO 3 TIMES A DAY AS NEEDED. Please keep your March appointment for further refills. 60 tablet 1   omeprazole (PRILOSEC) 40 MG capsule Take 1 capsule (40 mg total) by mouth 2 (two) times daily. Please schedule an office visit for further refills. Thank you 180 capsule 1    polyethylene glycol (MIRALAX) 17 g packet Take 17 g by mouth daily. 14 each 0   No current facility-administered medications for this visit.   No Known Allergies   Review of Systems: All systems reviewed and negative except where noted in HPI.   Lab Results  Component Value Date   WBC 8.1 08/13/2022   HGB 14.5 08/13/2022  HCT 42.8 08/13/2022   MCV 88.3 08/13/2022   PLT 348.0 08/13/2022    Lab Results  Component Value Date   CREATININE 1.15 12/17/2022   BUN 18 12/17/2022   NA 138 12/17/2022   K 3.9 12/17/2022   CL 102 12/17/2022   CO2 25 12/17/2022    Lab Results  Component Value Date   ALT 42 08/13/2022   AST 12 08/13/2022   ALKPHOS 56 08/13/2022   BILITOT 0.5 08/13/2022     Physical Exam: BP 132/68   Pulse 79   Ht 5\' 11"  (1.803 m)   Wt 247 lb (112 kg)   BMI 34.45 kg/m  Constitutional: Pleasant,well-developed, male in no acute distress. Neurological: Alert and oriented to person place and time. Psychiatric: Normal mood and affect. Behavior is normal.   ASSESSMENT: 52 y.o. male here for assessment of the following  1. Gastroparesis   2. Gastroesophageal reflux disease, unspecified whether esophagitis present   3. Dysphagia, unspecified type   4. Fatty liver    Generally if he is on regimen of low-dose Reglan and omeprazole 40 mg twice daily his symptoms are pretty well-controlled and is functioning okay.  We discussed long-term risks of the regimen.  I outlined risks of long-term Reglan, hopefully low-dose is very low risk for tar dive dyskinesia etc.  I did advise him to take a drug holiday from that periodically, he understands and tries to do that.  We otherwise discussed long-term risks of chronic PPI use.  I recommend trying to wean down dose as he tolerates again, it has been sometime since he is gone to once daily dosing.  He will try to slowly wean, alternate between once daily and twice daily dosing for a few weeks to see if he can get the omeprazole  dosing down a bit.  In regards to his dysphagia we discussed differential.  Perhaps he is developed a peptic stricture from reflux.  Recommended an EGD to further evaluate and potentially dilate to treat.  We discussed this.  He states symptoms are mild and is a bit hesitant to pursue EGD right now however if it worsens he would be more willing to do that.  I offered him a barium swallow in the interim to evaluate, he wants to hold off on that as well.  He will contact me if he wishes to proceed upper endoscopy.  His last exam showed no Barrett's which is reassuring.  We discussed fatty liver.  Ultimately weight loss would be beneficial for this and his metabolic syndrome/diabetes.  He is not drinking any alcohol.  Encouraged routine coffee intake.  He should have liver enzymes monitored yearly.  We may do an elastography at some point to assess for fibrotic change however enzymes relatively normal over time.  Of note, I reviewed his last colonoscopy and CT scans with him.  No evidence of Crohn's disease on his workup to date.  I think that is unlikely.  He will be mindful of symptoms and let me know if anything changes.  PLAN: - counssled on long term risks of chronic PPI - on omeprazole 40mg  BID - will try to wean to lowest dose to control symptoms if he can - counseled on long term risk of reglan - he will continue low dose as outlined - recommended / offered EGD with dilation, he declined for now, offered barium study, he declines and will let me know if / when he wishes to pursue workup - weight loss / coffee  drinking recommended for fatty liver, LFTs yearly, consider elastography over time - f/u yearly if not sooner with issues.  Jolly Mango, MD [facility]

## 2022-12-17 NOTE — Progress Notes (Unsigned)
Subjective:    Patient ID: Wayne Melton, male    DOB: 21-Mar-1971, 52 y.o.   MRN: [id number]  DOS:  12/17/2022 Type of visit - description: f/u Here for the management of diabetes and high cholesterol. Since the last time he is not eating healthier and is not exercising. Had a injury on the left shoulder a while back, pain has returned mostly when he reach above or reach out with the left arm.    Wt Readings from Last 3 Encounters:  12/17/22 247 lb (112 kg)  12/17/22 247 lb (112 kg)  11/05/22 252 lb 3.2 oz (114.4 kg)     Review of Systems See above   Past Medical History:  Diagnosis Date   Allergic rhinitis 07/05/2012   Constipation    chronic   Diabetes mellitus without complication (HCC)    No meds   Fatty liver disease, nonalcoholic    Gastroparesis    GERD , HH, h/o PUD 07/05/2012   Hyperlipidemia    IBS (irritable bowel syndrome) 11/06/2012   Obesity    OSA (obstructive sleep apnea)    mild   Seasonal allergies    Seasonal allergies    Sleep apnea     Past Surgical History:  Procedure Laterality Date   COLONOSCOPY     UPPER GASTROINTESTINAL ENDOSCOPY      Current Outpatient Medications  Medication Instructions   acetaminophen (TYLENOL) 1,000 mg, Oral, Every 6 hours PRN   atorvastatin (LIPITOR) 20 MG tablet TAKE 1 TABLET BY MOUTH EVERYDAY AT BEDTIME   budesonide (PULMICORT) 180 MCG/ACT inhaler 1 puff, Inhalation, 2 times daily   carbamide peroxide (DEBROX) 6.5 % OTIC solution 5 drops, Both EARS, Daily PRN   empagliflozin (JARDIANCE) 25 mg, Oral, Daily before breakfast   fenofibrate (TRICOR) 145 mg, Oral, Daily   fluticasone (FLONASE) 50 MCG/ACT nasal spray Nasal   metFORMIN (GLUCOPHAGE-XR) 750 MG 24 hr tablet TAKE 2 TABLETS (1,500 MG TOTAL) BY MOUTH EVERY DAY WITH BREAKFAST   metoCLOPramide (REGLAN) 5 mg, Oral, 2 times daily, TAKE 1 TABLET BY MOUTH 2 TO 3 TIMES A DAY AS NEEDED. Please keep your March appointment for further refills.   omeprazole  (PRILOSEC) 40 mg, Oral, 2 times daily, Please schedule an office visit for further refills. Thank you   polyethylene glycol (MIRALAX) 17 g, Oral, Daily       Objective:   Physical Exam BP 130/68   Pulse 69   Temp 97.8 F (36.6 C) (Oral)   Resp 16   Ht 5\' 11"  (1.803 m)   Wt 247 lb (112 kg)   SpO2 95%   BMI 34.45 kg/m  General:   Well developed, NAD, BMI noted. HEENT:  Normocephalic . Face symmetric, atraumatic Lungs:  CTA B Normal respiratory effort, no intercostal retractions, no accessory muscle use. Heart: RRR,  no murmur.  Lower extremities: no pretibial edema bilaterally  Skin: Not pale. Not jaundice Neurologic:  alert & oriented X3.  Speech normal, gait appropriate for age and unassisted Psych--  Cognition and judgment appear intact.  Cooperative with normal attention span and concentration.  Behavior appropriate. No anxious or depressed appearing.      Assessment    Assessment DM Hyperlipidemia Bronchospasm (on Qvar) GI: --h/o PUD (EGD @ [facility]  2002: neg  but reports had another EGD with ulcer) --GERD, IBS, occ constipation, gastroparesis (+ gastric emptying study October 2020) -- fatty liver per u/s 06-2015 -EGD 08-2017: Gastritis, polyps, benign findings per GI letter, no follow-up  OSA:  (-)  home sleep apnea test 2014,  Full sleep study~08-2015 : mild OSA, saw pulmonary, rx dental appliance, wt loss HST 08-2018: Rx CPAP - DOE x years>>>>  neg stress test 03/2015 Abnormal brain MRI 05-2020 (subsequently saw neurosurgery, recommend observation, per patient, no documentation)  PLAN DM: Last A1c was 7.5, Jardiance increased from 10 mg to 25 mg  He also takes metformin. He has not changed his lifestyle.  I provided a long explanation about the benefits of eating healthy, portion control and exercise.  He tells me that he is already controlling portions and eating okay.  He is not motivated to exercise. Plan: Check A1c.  Apparently it would be hard for him to  change his lifestyle consequently will have to adjust medications if needed. Encourage self learning, see AVS. Hyperlipidemia: On Lipitor, Tricor, recheck FLP. OSA: Seen by pulmonary 11/05/2022.  Excellent compliance. Left shoulder pain: Refer to sports medicine RTC 4 months

## 2022-12-19 NOTE — Assessment & Plan Note (Signed)
DM: Last A1c was 7.5, Jardiance increased from 10 mg to 25 mg  He also takes metformin. He has not changed his lifestyle.  I provided a long explanation about the benefits of eating healthy, portion control and exercise.  He tells me that he is already controlling portions and eating okay.  He is not motivated to exercise. Plan: Check A1c.  Apparently it would be hard for him to change his lifestyle consequently will have to adjust medications if needed. Encourage self learning, see AVS. Hyperlipidemia: On Lipitor, Tricor, recheck FLP. OSA: Seen by pulmonary 11/05/2022.  Excellent compliance. Left shoulder pain: Refer to sports medicine RTC 4 months

## 2022-12-20 MED ORDER — ATORVASTATIN CALCIUM 40 MG PO TABS: 40.0000 mg | ORAL_TABLET | Freq: Every day | ORAL | 1 refills | Status: DC

## 2022-12-20 NOTE — Addendum Note (Signed)
Addended by: Damita Dunnings D on: 12/20/2022 08:00 AM   Modules accepted: Orders

## 2022-12-27 NOTE — Progress Notes (Unsigned)
    Benito Mccreedy D.Bellamy Schuyler Phone: [phone]   Assessment and Plan:     There are no diagnoses linked to this encounter.  ***   Pertinent previous records reviewed include ***   Follow Up: ***     Subjective:   I, Wayne Melton, am serving as a Education administrator for Doctor Glennon Mac  Chief Complaint: left shoulder pain   HPI:   12/28/2022 Patient is a 52 year old male complaining of left shoulder pain. Patient states  Relevant Historical Information: ***  Additional pertinent review of systems negative.   Current Outpatient Medications:    acetaminophen (TYLENOL) 500 MG tablet, Take 1,000 mg by mouth every 6 (six) hours as needed for moderate pain., Disp: , Rfl:    atorvastatin (LIPITOR) 40 MG tablet, Take 1 tablet (40 mg total) by mouth at bedtime., Disp: 90 tablet, Rfl: 1   budesonide (PULMICORT) 180 MCG/ACT inhaler, Inhale 1 puff into the lungs 2 (two) times daily., Disp: 1 each, Rfl: 6   carbamide peroxide (DEBROX) 6.5 % OTIC solution, Place 5 drops into both ears daily as needed., Disp: 15 mL, Rfl: 0   empagliflozin (JARDIANCE) 25 MG TABS tablet, Take 1 tablet (25 mg total) by mouth daily before breakfast., Disp: 90 tablet, Rfl: 1   fenofibrate (TRICOR) 145 MG tablet, Take 1 tablet (145 mg total) by mouth daily., Disp: 90 tablet, Rfl: 1   fluticasone (FLONASE) 50 MCG/ACT nasal spray, Place into the nose., Disp: , Rfl:    metFORMIN (GLUCOPHAGE-XR) 750 MG 24 hr tablet, TAKE 2 TABLETS (1,500 MG TOTAL) BY MOUTH EVERY DAY WITH BREAKFAST, Disp: 180 tablet, Rfl: 1   metoCLOPramide (REGLAN) 5 MG tablet, Take 1 tablet (5 mg total) by mouth in the morning and at bedtime. TAKE 1 TABLET BY MOUTH 2 TO 3 TIMES A DAY AS NEEDED. Please keep your March appointment for further refills., Disp: 180 tablet, Rfl: 3   omeprazole (PRILOSEC) 40 MG capsule, Take 1 capsule (40 mg total) by mouth 2 (two) times daily. Please  schedule an office visit for further refills. Thank you, Disp: 180 capsule, Rfl: 3   polyethylene glycol (MIRALAX) 17 g packet, Take 17 g by mouth daily., Disp: 14 each, Rfl: 0   Objective:     There were no vitals filed for this visit.    There is no height or weight on file to calculate BMI.    Physical Exam:    ***   Electronically signed by:  Benito Mccreedy D.[facility] 7:33 AM 12/27/22

## 2022-12-28 ENCOUNTER — Ambulatory Visit (INDEPENDENT_AMBULATORY_CARE_PROVIDER_SITE_OTHER): Payer: 59

## 2022-12-28 ENCOUNTER — Ambulatory Visit (INDEPENDENT_AMBULATORY_CARE_PROVIDER_SITE_OTHER): Payer: 59 | Admitting: Sports Medicine

## 2022-12-28 VITALS — BP 118/80 | HR 85 | Ht 71.0 in | Wt 247.0 lb

## 2022-12-28 DIAGNOSIS — G8929 Other chronic pain: Secondary | ICD-10-CM

## 2022-12-28 DIAGNOSIS — M25512 Pain in left shoulder: Secondary | ICD-10-CM

## 2022-12-28 DIAGNOSIS — M7542 Impingement syndrome of left shoulder: Secondary | ICD-10-CM

## 2022-12-28 MED ORDER — MELOXICAM 15 MG PO TABS: 15.0000 mg | ORAL_TABLET | Freq: Every day | ORAL | 0 refills | Status: DC

## 2022-12-28 NOTE — Patient Instructions (Addendum)
Good to see you - Start meloxicam 15 mg daily x2 weeks.  If still having pain after 2 weeks, complete 3rd-week of meloxicam. May use remaining meloxicam as needed once daily for pain control.  Do not to use additional NSAIDs while taking meloxicam.  May use Tylenol [phone] mg 2 to 3 times a day for breakthrough pain. Shoulder HEP 4 week follow up

## 2023-01-11 ENCOUNTER — Encounter: Payer: Self-pay | Admitting: Internal Medicine

## 2023-01-17 ENCOUNTER — Other Ambulatory Visit: Payer: Self-pay | Admitting: Internal Medicine

## 2023-01-31 NOTE — Progress Notes (Unsigned)
    Aleen Sells D.[facility] [address] [place] [address] Phone: [phone]   Assessment and Plan:     There are no diagnoses linked to this encounter.  ***   Pertinent previous records reviewed include ***   Follow Up: ***     Subjective:   I, Ryana Montecalvo, am serving as a Neurosurgeon for Doctor Richardean Sale   Chief Complaint: left shoulder pain    HPI:    12/28/2022 Patient is a 52 year old male complaining of left shoulder pain. Patient states that he did something to it several years ago, last August he was trimming and felt a pull in his shoulder been hurting him ever since, no radiating pain, decreased ROM due to pain, no meds for the pain, no numbness or tingling, is able to lift , pain with pulling, gets intermittent,  painful locking clicking and popping, posterior shoulder pain,   02/01/2023 Patient states    Relevant Historical Information: DM type II  Additional pertinent review of systems negative.   Current Outpatient Medications:    acetaminophen (TYLENOL) 500 MG tablet, Take 1,000 mg by mouth every 6 (six) hours as needed for moderate pain., Disp: , Rfl:    atorvastatin (LIPITOR) 40 MG tablet, Take 1 tablet (40 mg total) by mouth at bedtime., Disp: 90 tablet, Rfl: 1   budesonide (PULMICORT) 180 MCG/ACT inhaler, Inhale 1 puff into the lungs 2 (two) times daily., Disp: 1 each, Rfl: 6   carbamide peroxide (DEBROX) 6.5 % OTIC solution, Place 5 drops into both ears daily as needed., Disp: 15 mL, Rfl: 0   empagliflozin (JARDIANCE) 25 MG TABS tablet, Take 1 tablet (25 mg total) by mouth daily before breakfast., Disp: 90 tablet, Rfl: 1   fenofibrate (TRICOR) 145 MG tablet, Take 1 tablet (145 mg total) by mouth daily., Disp: 90 tablet, Rfl: 1   fluticasone (FLONASE) 50 MCG/ACT nasal spray, Place into the nose., Disp: , Rfl:    meloxicam (MOBIC) 15 MG tablet, Take 1 tablet (15 mg total) by mouth daily., Disp: 30 tablet,  Rfl: 0   metFORMIN (GLUCOPHAGE-XR) 750 MG 24 hr tablet, Take 2 tablets (1,500 mg total) by mouth daily with breakfast., Disp: 180 tablet, Rfl: 1   metoCLOPramide (REGLAN) 5 MG tablet, Take 1 tablet (5 mg total) by mouth in the morning and at bedtime. TAKE 1 TABLET BY MOUTH 2 TO 3 TIMES A DAY AS NEEDED. Please keep your March appointment for further refills., Disp: 180 tablet, Rfl: 3   omeprazole (PRILOSEC) 40 MG capsule, Take 1 capsule (40 mg total) by mouth 2 (two) times daily. Please schedule an office visit for further refills. Thank you, Disp: 180 capsule, Rfl: 3   polyethylene glycol (MIRALAX) 17 g packet, Take 17 g by mouth daily., Disp: 14 each, Rfl: 0   Objective:     There were no vitals filed for this visit.    There is no height or weight on file to calculate BMI.    Physical Exam:    ***   Electronically signed by:  Aleen Sells D.[facility] 12:11 PM 01/31/23

## 2023-02-01 ENCOUNTER — Ambulatory Visit (INDEPENDENT_AMBULATORY_CARE_PROVIDER_SITE_OTHER): Payer: 59 | Admitting: Sports Medicine

## 2023-02-01 VITALS — BP 124/80 | HR 74 | Ht 71.0 in | Wt 250.0 lb

## 2023-02-01 DIAGNOSIS — M25512 Pain in left shoulder: Secondary | ICD-10-CM

## 2023-02-01 DIAGNOSIS — M7542 Impingement syndrome of left shoulder: Secondary | ICD-10-CM | POA: Diagnosis not present

## 2023-02-01 DIAGNOSIS — G8929 Other chronic pain: Secondary | ICD-10-CM

## 2023-02-01 NOTE — Patient Instructions (Addendum)
Good to see you  Continue HEP  Discontinue meloxicam and use remainder as needed  Tylenol fr day to day pain relief  As needed follow up

## 2023-04-03 ENCOUNTER — Other Ambulatory Visit: Payer: Self-pay | Admitting: Internal Medicine

## 2023-04-18 ENCOUNTER — Ambulatory Visit (INDEPENDENT_AMBULATORY_CARE_PROVIDER_SITE_OTHER): Payer: 59 | Admitting: Internal Medicine

## 2023-04-18 VITALS — BP 126/68 | HR 70 | Temp 98.1°F | Resp 16 | Ht 72.0 in | Wt 245.0 lb

## 2023-04-18 DIAGNOSIS — E119 Type 2 diabetes mellitus without complications: Secondary | ICD-10-CM

## 2023-04-18 DIAGNOSIS — E785 Hyperlipidemia, unspecified: Secondary | ICD-10-CM | POA: Diagnosis not present

## 2023-04-18 DIAGNOSIS — E1169 Type 2 diabetes mellitus with other specified complication: Secondary | ICD-10-CM | POA: Diagnosis not present

## 2023-04-18 NOTE — Progress Notes (Unsigned)
Subjective:    Patient ID: Wayne Melton, male    DOB: 20-Feb-1971, 52 y.o.   MRN: [id number]  DOS:  04/18/2023 Type of visit - description: Follow-up Things are about the same. Good med compliance. No ambulatory CBGs. On Jardiance, denies any GU symptoms or rash.  Wt Readings from Last 3 Encounters:  04/18/23 245 lb (111.1 kg)  02/01/23 250 lb (113.4 kg)  12/28/22 247 lb (112 kg)     Review of Systems See above   Past Medical History:  Diagnosis Date   Allergic rhinitis 07/05/2012   Constipation    chronic   Diabetes mellitus without complication (HCC)    No meds   Fatty liver disease, nonalcoholic    Gastroparesis    GERD , HH, h/o PUD 07/05/2012   Hyperlipidemia    IBS (irritable bowel syndrome) 11/06/2012   Obesity    OSA (obstructive sleep apnea)    mild   Seasonal allergies    Seasonal allergies    Sleep apnea     Past Surgical History:  Procedure Laterality Date   COLONOSCOPY     UPPER GASTROINTESTINAL ENDOSCOPY      Current Outpatient Medications  Medication Instructions   acetaminophen (TYLENOL) 1,000 mg, Oral, Every 6 hours PRN   atorvastatin (LIPITOR) 40 mg, Oral, Daily at bedtime   budesonide (PULMICORT) 180 MCG/ACT inhaler 1 puff, Inhalation, 2 times daily   carbamide peroxide (DEBROX) 6.5 % OTIC solution 5 drops, Both EARS, Daily PRN   empagliflozin (JARDIANCE) 25 mg, Oral, Daily before breakfast   fenofibrate (TRICOR) 145 mg, Oral, Daily   fluticasone (FLONASE) 50 MCG/ACT nasal spray Nasal   meloxicam (MOBIC) 15 mg, Oral, Daily   metFORMIN (GLUCOPHAGE-XR) 1,500 mg, Oral, Daily with breakfast   metoCLOPramide (REGLAN) 5 mg, Oral, 2 times daily, TAKE 1 TABLET BY MOUTH 2 TO 3 TIMES A DAY AS NEEDED. Please keep your March appointment for further refills.   omeprazole (PRILOSEC) 40 mg, Oral, 2 times daily, Please schedule an office visit for further refills. Thank you   polyethylene glycol (MIRALAX) 17 g, Oral, Daily       Objective:    Physical Exam BP 126/68   Pulse 70   Temp 98.1 F (36.7 C) (Oral)   Resp 16   Ht 6' (1.829 m)   Wt 245 lb (111.1 kg)   SpO2 97%   BMI 33.23 kg/m  General:   Well developed, NAD, BMI noted. HEENT:  Normocephalic . Face symmetric, atraumatic Lungs:  CTA B Normal respiratory effort, no intercostal retractions, no accessory muscle use. Heart: RRR,  no murmur.  DM foot exam: No edema, good pedal pulses, pinprick examination negative Skin: Not pale. Not jaundice Neurologic:  alert & oriented X3.  Speech normal, gait appropriate for age and unassisted Psych--  Cognition and judgment appear intact.  Cooperative with normal attention span and concentration.  Behavior appropriate. No anxious or depressed appearing.      Assessment    Assessment DM Hyperlipidemia Bronchospasm (on Qvar) GI: --h/o PUD (EGD @ [facility]  2002: neg  but reports had another EGD with ulcer) --GERD, IBS, occ constipation, gastroparesis (+ gastric emptying study October 2020) -- fatty liver per u/s 06-2015 -EGD 08-2017: Gastritis, polyps, benign findings per GI letter, no follow-up OSA:  (-)  home sleep apnea test 2014,  Full sleep study~08-2015 : mild OSA, saw pulmonary, rx dental appliance, wt loss HST 08-2018: Rx CPAP - DOE x years>>>>  neg stress test 03/2015 Abnormal  brain MRI 05-2020 (subsequently saw neurosurgery, recommend observation, per patient, no documentation)  PLAN  DM: Last A1c was 7.0, no changes were made.  On Jardiance, metformin, diet about the same, trying to be more active.  Feet exam negative.  Check A1c, CMP.   Dyslipidemia: Based on last FLP atorvastatin was increased to 40 mg daily, he is also on fenofibrate.  He is fasting, check a lipid profile. RTC 08-2019 for CPX   3--- DM: Last A1c was 7.5, Jardiance increased from 10 mg to 25 mg  He also takes metformin. He has not changed his lifestyle.  I provided a long explanation about the benefits of eating healthy, portion control  and exercise.  He tells me that he is already controlling portions and eating okay.  He is not motivated to exercise. Plan: Check A1c.  Apparently it would be hard for him to change his lifestyle consequently will have to adjust medications if needed. Encourage self learning, see AVS. Hyperlipidemia: On Lipitor, Tricor, recheck FLP. OSA: Seen by pulmonary 11/05/2022.  Excellent compliance. Left shoulder pain: Refer to sports medicine RTC 4 months

## 2023-04-18 NOTE — Patient Instructions (Addendum)
    GO TO THE LAB : Get the blood work     GO TO THE FRONT DESK, PLEASE SCHEDULE YOUR APPOINTMENTS Come back for  a physical exam by 08-2023       Vaccines I recommend: Covid booster Flu shot this fall  Per our records you are due for your diabetic eye exam. Please contact your eye doctor to schedule an appointment. Please have them send copies of your office visit notes to [place]. Our fax number is [phone]. If you need a referral to an eye doctor please let us know.

## 2023-04-19 LAB — HEMOGLOBIN A1C: Hgb A1c MFr Bld: 6.7 % — ABNORMAL HIGH (ref 4.6–6.5)

## 2023-04-19 LAB — COMPREHENSIVE METABOLIC PANEL
ALT: 41 U/L (ref 0–53)
AST: 15 U/L (ref 0–37)
Albumin: 4.6 g/dL (ref 3.5–5.2)
Alkaline Phosphatase: 47 U/L (ref 39–117)
BUN: 13 mg/dL (ref 6–23)
CO2: 27 mEq/L (ref 19–32)
Calcium: 10 mg/dL (ref 8.4–10.5)
Chloride: 103 mEq/L (ref 96–112)
Creatinine, Ser: 1.14 mg/dL (ref 0.40–1.50)
GFR: 75.37 mL/min (ref >60.00)
Glucose, Bld: 84 mg/dL (ref 70–99)
Potassium: 3.9 mEq/L (ref 3.5–5.1)
Sodium: 138 mEq/L (ref 135–145)
Total Bilirubin: 0.6 mg/dL (ref 0.2–1.2)
Total Protein: 7.1 g/dL (ref 6.0–8.3)

## 2023-04-19 LAB — LIPID PANEL
Cholesterol: 205 mg/dL — ABNORMAL HIGH (ref 0–200)
HDL: 33.4 mg/dL — ABNORMAL LOW (ref >39.00)
NonHDL: 171.51
Total CHOL/HDL Ratio: 6
Triglycerides: 291 mg/dL — ABNORMAL HIGH (ref 0.0–149.0)
VLDL: 58.2 mg/dL — ABNORMAL HIGH (ref 0.0–40.0)

## 2023-04-19 LAB — LDL CHOLESTEROL, DIRECT: Direct LDL: 125 mg/dL

## 2023-04-19 NOTE — Assessment & Plan Note (Signed)
DM: Last A1c was 7.0, no changes were made.  On Jardiance, metformin, diet about the same, trying to be more active.  Feet exam negative.  Check A1c, CMP. Further advise w/ results Dyslipidemia: Based on last FLP atorvastatin was increased to 40 mg daily, he is also on fenofibrate.  He is fasting, check a lipid profile. RTC 08-2023 for CPX

## 2023-04-21 MED ORDER — ATORVASTATIN CALCIUM 80 MG PO TABS: 80.0000 mg | ORAL_TABLET | Freq: Every day | ORAL | 1 refills | Status: DC

## 2023-04-21 NOTE — Addendum Note (Signed)
Addended byConrad Nelson D on: 04/21/2023 08:22 AM   Modules accepted: Orders

## 2023-05-13 ENCOUNTER — Other Ambulatory Visit: Payer: Self-pay | Admitting: Internal Medicine

## 2023-07-03 ENCOUNTER — Other Ambulatory Visit: Payer: Self-pay | Admitting: Internal Medicine

## 2023-07-16 ENCOUNTER — Other Ambulatory Visit: Payer: Self-pay | Admitting: Internal Medicine

## 2023-07-26 LAB — HM DIABETES EYE EXAM

## 2023-08-19 ENCOUNTER — Encounter: Payer: Self-pay | Admitting: Internal Medicine

## 2023-08-19 ENCOUNTER — Other Ambulatory Visit: Payer: Self-pay | Admitting: Internal Medicine

## 2023-08-19 ENCOUNTER — Ambulatory Visit (INDEPENDENT_AMBULATORY_CARE_PROVIDER_SITE_OTHER): Payer: 59 | Admitting: Internal Medicine

## 2023-08-19 VITALS — BP 126/80 | HR 53 | Temp 97.7°F | Resp 16 | Ht 72.0 in | Wt 244.0 lb

## 2023-08-19 DIAGNOSIS — G4733 Obstructive sleep apnea (adult) (pediatric): Secondary | ICD-10-CM

## 2023-08-19 DIAGNOSIS — Z0000 Encounter for general adult medical examination without abnormal findings: Secondary | ICD-10-CM

## 2023-08-19 DIAGNOSIS — Z0001 Encounter for general adult medical examination with abnormal findings: Secondary | ICD-10-CM | POA: Diagnosis not present

## 2023-08-19 DIAGNOSIS — E119 Type 2 diabetes mellitus without complications: Secondary | ICD-10-CM | POA: Diagnosis not present

## 2023-08-19 DIAGNOSIS — J9801 Acute bronchospasm: Secondary | ICD-10-CM | POA: Diagnosis not present

## 2023-08-19 DIAGNOSIS — Z23 Encounter for immunization: Secondary | ICD-10-CM

## 2023-08-19 DIAGNOSIS — Z7984 Long term (current) use of oral hypoglycemic drugs: Secondary | ICD-10-CM

## 2023-08-19 DIAGNOSIS — E785 Hyperlipidemia, unspecified: Secondary | ICD-10-CM | POA: Diagnosis not present

## 2023-08-19 MED ORDER — BUDESONIDE-FORMOTEROL FUMARATE 160-4.5 MCG/ACT IN AERO: 2.0000 | INHALATION_SPRAY | Freq: Three times a day (TID) | RESPIRATORY_TRACT | 3 refills | Status: DC | PRN

## 2023-08-19 NOTE — Progress Notes (Unsigned)
Subjective:    Patient ID: Wayne Melton, male    DOB: 07/27/1971, 52 y.o.   MRN: [id number]  DOS:  08/19/2023 Type of visit - description: CPX  Here for CP X. Chronic medical problems addressed. Uses Pulmicort twice daily with good control of sporadic wheezing.  Has upper GI symptoms at baseline. No chest pain or palpitations. No blood in the stools or in the urine.  Review of Systems See above   Past Medical History:  Diagnosis Date   Allergic rhinitis 07/05/2012   Constipation    chronic   Diabetes mellitus without complication (HCC)    No meds   Fatty liver disease, nonalcoholic    Gastroparesis    GERD , HH, h/o PUD 07/05/2012   Hyperlipidemia    IBS (irritable bowel syndrome) 11/06/2012   Obesity    OSA (obstructive sleep apnea)    mild   Seasonal allergies    Seasonal allergies    Sleep apnea     Past Surgical History:  Procedure Laterality Date   COLONOSCOPY     UPPER GASTROINTESTINAL ENDOSCOPY      Current Outpatient Medications  Medication Instructions   acetaminophen (TYLENOL) 1,000 mg, Oral, Every 6 hours PRN   atorvastatin (LIPITOR) 80 mg, Oral, Daily at bedtime   budesonide (PULMICORT FLEXHALER) 180 MCG/ACT inhaler 1 puff, Inhalation, 2 times daily   empagliflozin (JARDIANCE) 25 mg, Oral, Daily before breakfast   fenofibrate (TRICOR) 145 mg, Oral, Daily   fluticasone (FLONASE) 50 MCG/ACT nasal spray Nasal   meloxicam (MOBIC) 15 mg, Oral, Daily   metFORMIN (GLUCOPHAGE-XR) 1,500 mg, Oral, Daily with breakfast   metoCLOPramide (REGLAN) 5 mg, Oral, 2 times daily, TAKE 1 TABLET BY MOUTH 2 TO 3 TIMES A DAY AS NEEDED. Please keep your March appointment for further refills.   omeprazole (PRILOSEC) 40 mg, Oral, 2 times daily, Please schedule an office visit for further refills. Thank you   polyethylene glycol (MIRALAX) 17 g, Oral, Daily       Objective:   Physical Exam BP 126/80   Pulse (!) 53   Temp 97.7 F (36.5 C) (Oral)   Resp 16   Ht  6' (1.829 m)   Wt 244 lb (110.7 kg)   SpO2 97%   BMI 33.09 kg/m  General: Well developed, NAD, BMI noted Neck: No  thyromegaly  HEENT:  Normocephalic . Face symmetric, atraumatic Lungs:  CTA B Normal respiratory effort, no intercostal retractions, no accessory muscle use. Heart: RRR,  no murmur.  Abdomen:  Not distended, soft, non-tender. No rebound or rigidity.   Lower extremities: no pretibial edema bilaterally  Skin: Exposed areas without rash. Not pale. Not jaundice Neurologic:  alert & oriented X3.  Speech normal, gait appropriate for age and unassisted Strength symmetric and appropriate for age.  Psych: Cognition and judgment appear intact.  Cooperative with normal attention span and concentration.  Behavior appropriate. No anxious or depressed appearing.     Assessment    Assessment DM Hyperlipidemia Bronchospasm (on Qvar) GI: --h/o PUD (EGD @ [facility]  2002: neg  but reports had another EGD with ulcer) --GERD, IBS, occ constipation, gastroparesis (+ gastric emptying study October 2020) -- fatty liver per u/s 06-2015 -EGD 08-2017: Gastritis, polyps, benign findings per GI letter, no follow-up OSA:  (-)  home sleep apnea test 2014,  Full sleep study~08-2015 : mild OSA, saw pulmonary, rx dental appliance, wt loss HST 08-2018: Rx CPAP - DOE x years>>>>  neg stress test 03/2015 Abnormal  brain MRI 05-2020 (subsequently saw neurosurgery, recommend observation, per patient, no documentation)  PLAN Here for CPX  - Td 2016 -  pnm shot: 2016; prevnar 2017 - Flu shot today. - covid shot: pros> cons, rec to proceed.  Shingrix discussed.  -CCS: +FH   colon polyps (mother, early 89s ) and colon cancer ( uncle, mid 72s); cscope 12-2015, Cscope 07-2021, next 5 years, see pathology report. - prostate ca screening: No family history, no symptoms, start checking PSAs. -Diet and exercise: Discussed. - labs: BMP AST ALT FLP CBC A1c PSA DM: On Jardiance, metformin, checking labs.   High cholesterol: Last FLP 04/18/2023, LDL was 125, Lipitor was increased to 80 mg, he also takes Tricor.  Checking labs today. Bronchospasm: On Pulmicort twice daily, recommend to switch to Symbicort 2 puffs 3 times daily as needed only.  He is agreeable.  Has a good technique and gargles after every use of inhalers. OSA: Good CPAP compliance. RTC 5 to 6 months

## 2023-08-19 NOTE — Patient Instructions (Addendum)
Vaccines I recommend: Covid booster Shingrix (shingles)  Stop Pulmicort You can use Symbicort 2 puffs every 8 hours as needed for cough or quizzing.     GO TO THE LAB : Get the blood work     Next visit with me 5 to 6 months. Please schedule it at the front desk       Per our records you are due for your diabetic eye exam. Please contact your eye doctor to schedule an appointment. Please have them send copies of your office visit notes to [place]. Our fax number is [phone]. If you need a referral to an eye doctor please let us know.

## 2023-08-19 NOTE — Telephone Encounter (Signed)
Pt's insurance doesn't cover Symbicort, okay to change to Advair/Wixela?

## 2023-08-20 LAB — CBC WITH DIFFERENTIAL/PLATELET
Absolute Lymphocytes: 2242 {cells}/uL (ref 850–3900)
Absolute Monocytes: 570 {cells}/uL (ref 200–950)
Basophils Absolute: 29 {cells}/uL (ref 0–200)
Basophils Relative: 0.3 %
Eosinophils Absolute: 38 {cells}/uL (ref 15–500)
Eosinophils Relative: 0.4 %
HCT: 43.3 % (ref 38.5–50.0)
Hemoglobin: 14.7 g/dL (ref 13.2–17.1)
MCH: 29.5 pg (ref 27.0–33.0)
MCHC: 33.9 g/dL (ref 32.0–36.0)
MCV: 86.9 fL (ref 80.0–100.0)
MPV: 9.2 fL (ref 7.5–12.5)
Monocytes Relative: 6 %
Neutro Abs: 6622 {cells}/uL (ref 1500–7800)
Neutrophils Relative %: 69.7 %
Platelets: 393 10*3/uL (ref 140–400)
RBC: 4.98 10*6/uL (ref 4.20–5.80)
RDW: 12.7 % (ref 11.0–15.0)
Total Lymphocyte: 23.6 %
WBC: 9.5 10*3/uL (ref 3.8–10.8)

## 2023-08-20 LAB — BASIC METABOLIC PANEL
BUN: 15 mg/dL (ref 7–25)
CO2: 24 mmol/L (ref 20–32)
Calcium: 9.9 mg/dL (ref 8.6–10.3)
Chloride: 103 mmol/L (ref 98–110)
Creat: 1.16 mg/dL (ref 0.70–1.30)
Glucose, Bld: 85 mg/dL (ref 65–99)
Potassium: 4.2 mmol/L (ref 3.5–5.3)
Sodium: 140 mmol/L (ref 135–146)

## 2023-08-20 LAB — HEMOGLOBIN A1C
Hgb A1c MFr Bld: 7 %{Hb} — ABNORMAL HIGH (ref <5.7)
Mean Plasma Glucose: 154 mg/dL
eAG (mmol/L): 8.5 mmol/L

## 2023-08-20 LAB — LIPID PANEL
Cholesterol: 204 mg/dL — ABNORMAL HIGH (ref <200)
HDL: 33 mg/dL — ABNORMAL LOW (ref >=40)
LDL Cholesterol (Calc): 127 mg/dL — ABNORMAL HIGH
Non-HDL Cholesterol (Calc): 171 mg/dL — ABNORMAL HIGH (ref <130)
Total CHOL/HDL Ratio: 6.2 (calc) — ABNORMAL HIGH (ref <5.0)
Triglycerides: 309 mg/dL — ABNORMAL HIGH (ref <150)

## 2023-08-20 LAB — PSA: PSA: 0.33 ng/mL (ref <=4.00)

## 2023-08-20 LAB — MICROALBUMIN / CREATININE URINE RATIO
Creatinine, Urine: 47 mg/dL (ref 20–320)
Microalb Creat Ratio: 4 mg/g{creat} (ref <30)
Microalb, Ur: 0.2 mg/dL

## 2023-08-20 LAB — AST: AST: 14 U/L (ref 10–35)

## 2023-08-20 LAB — ALT: ALT: 40 U/L (ref 9–46)

## 2023-08-21 ENCOUNTER — Encounter: Payer: Self-pay | Admitting: Internal Medicine

## 2023-08-21 NOTE — Assessment & Plan Note (Signed)
Here for CPX DM: On Jardiance, metformin, checking labs.  High cholesterol: Last FLP 04/18/2023, LDL was 125, Lipitor was increased to 80 mg, he also takes Tricor.  Checking labs today. Bronchospasm: On Pulmicort twice daily, recommend to switch to Symbicort 2 puffs 3 times daily as needed only.  He is agreeable.  Has a good technique and gargles after every use of inhalers. OSA: Good CPAP compliance. RTC 5 to 6 months

## 2023-08-21 NOTE — Assessment & Plan Note (Signed)
Here for CPX - Td 2016 -  pnm shot: 2016; prevnar 2017 - Flu shot today. - covid shot: pros> cons, rec to proceed.  Shingrix discussed. -CCS: +FH   colon polyps (mother, early 67s ) and colon cancer ( uncle, mid 41s); cscope 12-2015, Cscope 07-2021, next 5 years, per pathology report. - prostate ca screening: No family history, no symptoms, start checking PSAs. -Diet and exercise: Discussed. - labs: BMP AST ALT FLP CBC A1c PSA

## 2023-08-22 NOTE — Telephone Encounter (Signed)
Okay Wixela, let him know that this is not HFA but rather a powder base medication.  If he needs training on how to use it he could come to the office.  He can get some training on online videos as well.

## 2023-08-22 NOTE — Telephone Encounter (Signed)
Rx sent. Mychart message sent to Pt making him aware of change.

## 2023-08-23 MED ORDER — RYBELSUS 3 MG PO TABS: 3.0000 mg | ORAL_TABLET | Freq: Every day | ORAL | 0 refills | Status: DC

## 2023-08-23 NOTE — Addendum Note (Signed)
Addended byConrad Pisgah D on: 08/23/2023 02:49 PM   Modules accepted: Orders

## 2023-09-05 ENCOUNTER — Encounter: Payer: Self-pay | Admitting: Internal Medicine

## 2023-09-26 ENCOUNTER — Other Ambulatory Visit: Payer: Self-pay | Admitting: Internal Medicine

## 2023-09-26 MED ORDER — RYBELSUS 7 MG PO TABS: 7.0000 mg | ORAL_TABLET | Freq: Every day | ORAL | 0 refills | Status: DC

## 2023-10-04 ENCOUNTER — Other Ambulatory Visit: Payer: Self-pay | Admitting: Internal Medicine

## 2023-10-11 ENCOUNTER — Other Ambulatory Visit: Payer: Self-pay | Admitting: Internal Medicine

## 2023-10-31 ENCOUNTER — Encounter: Payer: Self-pay | Admitting: Gastroenterology

## 2023-11-04 DIAGNOSIS — G4733 Obstructive sleep apnea (adult) (pediatric): Secondary | ICD-10-CM | POA: Diagnosis not present

## 2023-11-07 ENCOUNTER — Encounter: Payer: Self-pay | Admitting: Nurse Practitioner

## 2023-11-07 ENCOUNTER — Ambulatory Visit (INDEPENDENT_AMBULATORY_CARE_PROVIDER_SITE_OTHER): Payer: 59 | Admitting: Nurse Practitioner

## 2023-11-07 VITALS — BP 104/68 | HR 79 | Temp 98.1°F | Ht 72.0 in | Wt 246.4 lb

## 2023-11-07 DIAGNOSIS — F5101 Primary insomnia: Secondary | ICD-10-CM | POA: Diagnosis not present

## 2023-11-07 DIAGNOSIS — G4733 Obstructive sleep apnea (adult) (pediatric): Secondary | ICD-10-CM | POA: Diagnosis not present

## 2023-11-07 DIAGNOSIS — G4700 Insomnia, unspecified: Secondary | ICD-10-CM | POA: Insufficient documentation

## 2023-11-07 MED ORDER — DOXEPIN HCL 6 MG PO TABS: 6.0000 mg | ORAL_TABLET | Freq: Every evening | ORAL | 1 refills | Status: DC | PRN

## 2023-11-07 NOTE — Assessment & Plan Note (Addendum)
Mild OSA on CPAP. Excellent compliance and control. Receives benefit from use. May be due for new machine - orders placed today. Advised him if he is not able to get a new machine, to take his machine to DME for troubleshooting. Aware of proper care/use of device. Safe driving practices reviewed. Healthy weight loss encouraged.   Patient Instructions  Continue to use CPAP every night, minimum of 4-6 hours a night.  Change equipment as directed. Wash your tubing with warm soap and water daily, hang to dry. Wash humidifier portion weekly. Use bottled, distilled water and change daily Be aware of reduced alertness and do not drive or operate heavy machinery if experiencing this or drowsiness.  Exercise encouraged, as tolerated. Healthy weight management discussed.  Avoid or decrease alcohol consumption and medications that make you more sleepy, if possible. Notify if persistent daytime sleepiness occurs even with consistent use of PAP therapy.  We will put in orders for a new CPAP machine   Start doxepin 6 mg At bedtime as needed for sleep. Take immediately before bed. Ensure you have 7-8 hours in the bed after taking. Monitor for any mood changes or changes in sleep habits. Stop and notify immediately if these occur. Do not drive or operate heavy machinery after taking. Do not take with alcohol or other sedating medications. Ensure you apply your CPAP within 5-10 minutes of taking to avoid falling asleep without it. May cause some morning grogginess or vivid dreams.   Follow up in 8-10 weeks with Dr. Wynona Neat or Philis Nettle. If symptoms do not improve or worsen, please contact office for sooner follow up or seek emergency care.

## 2023-11-07 NOTE — Patient Instructions (Signed)
Continue to use CPAP every night, minimum of 4-6 hours a night.  Change equipment as directed. Wash your tubing with warm soap and water daily, hang to dry. Wash humidifier portion weekly. Use bottled, distilled water and change daily Be aware of reduced alertness and do not drive or operate heavy machinery if experiencing this or drowsiness.  Exercise encouraged, as tolerated. Healthy weight management discussed.  Avoid or decrease alcohol consumption and medications that make you more sleepy, if possible. Notify if persistent daytime sleepiness occurs even with consistent use of PAP therapy.  We will put in orders for a new CPAP machine   Start doxepin 6 mg At bedtime as needed for sleep. Take immediately before bed. Ensure you have 7-8 hours in the bed after taking. Monitor for any mood changes or changes in sleep habits. Stop and notify immediately if these occur. Do not drive or operate heavy machinery after taking. Do not take with alcohol or other sedating medications. Ensure you apply your CPAP within 5-10 minutes of taking to avoid falling asleep without it. May cause some morning grogginess or vivid dreams.   Follow up in 8-10 weeks with Dr. Wynona Neat or Philis Nettle. If symptoms do not improve or worsen, please contact office for sooner follow up or seek emergency care.

## 2023-11-07 NOTE — Assessment & Plan Note (Signed)
Persistent difficulties with sleep maintenance. Reviewed pharmacological options. Shared decision to trial doxepin 6 mg At bedtime as needed for sleep. Side effect/safety profile reviewed. Sleep hygiene reviewed.

## 2023-11-07 NOTE — Progress Notes (Signed)
@Patient  ID: Wayne Melton, male    DOB: 1971/06/16, 53 y.o.   MRN: [id number]  Chief Complaint  Patient presents with   Follow-up    Doing well.  No sx noted today.  Needs new CPAP    Referring provider: Wanda Plump, MD  HPI: 53 year old male, former smoker referred for sleep consult/re-establish care. He is a former patient of Dr. Evlyn Courier and last seen in office 11/05/2022 by [facility] NP. Past medical history significant for allergic rhinitis, OSA on CPAP, GERD, IBS, DM, HLD.   TEST/EVENTS:  08/10/2018 HST: AHI 13.9/h, SpO2 low 76%  11/05/2022: OV with Tinsley Everman NP for sleep consult by Dr. Drue Novel. He has been on CPAP for many years now. Last seen in our office in 2019. He would like to re-establish care here. He has been having some trouble with his nighttime sleep for the past few months. Wakes around 3-4 AM and has a lot of trouble getting back to sleep. He also occasionally wakes up sweating. He's tried melatonin 10 mg but hasn't had much success with this. He does feel more tired during the day with this. He's worried it may be related to his CPAP not working properly; although, he doesn't have any issues with the machine to correlate this to. He does have some life stressors in his job. He denies morning headaches, snoring, dry mouth, drowsy driving, sleep parasomnias/paralysis. No history of narcolepsy or cataplexy. Goes to bed around 10-11 pm. Falls asleep within 20-30 min. Wakes 4-5 times a night; extended period around 3-4 AM. Gets up at 6:30-6:45 AM. He is on CPAP 5-15 cmH2O with large full face mask. Fits well without significant leaks. He doesn't have a SD card but does have download available on the app on his phone. He has gained 10-15 lb in the last two years.  History of mild asthma on pulmicort and well controlled. He has diabetes which is controlled with metformin. No history of stroke. He is a former smoker; quit 2022. Drinks alcohol 2-3 times a year. Drinks 2-3 cups of coffee a day; last  cup around 1030 am. Lives with his wife and two sons. 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 Family history of seasonal allergies, asthma, and heart disease.  Ep[facility] 3 10/05/2022-11/04/2022 CPAP 5-15 cmH2O  29/30 days; 96.7% >4 hr; av use 8 hours 24 min AHI on download <2.5/h  No significant leaks  11/07/2023: Today - follow up Patient presents today for yearly follow-up.  Wears a CPAP every night.  Feels unchanged compared to his last visit.  Does feel better when he uses his CPAP.  Still having trouble with his sleep at night.  Tends to fall asleep easily but wakes up frequently, especially around 3 to 4 AM and has trouble getting back to sleep.  This does cause some daytime sleepiness symptoms.  He has tried melatonin without much success.  We had discussed potential of sleep aids last time but he was hesitant to do this.  He would like to discuss this again today. He thinks he may be due for a new CPAP machine.  He got his original machine in 2019.  It was recalled and replaced with a refurbished machine.  He is having some difficulties with the humidifier attachment being broken as well as some of the buttons not working like they are supposed to.  He has not reach out to his DME company regarding this.  Denies any drowsy driving or morning  headaches.  No history of sleep parasomnia/paralysis. He does have a history of asthma and is on Pulmicort.  It is well-controlled.  Has not had any recent exacerbations.  Followed by his PCP for this.  10/08/2023-11/06/2023 CPAP 5-15 cmH2O 30/30 days; 100% >4 hr; average use 8 hr 8 min AHI on download <2.5/h No significant leaks   No Known Allergies  Immunization History  Administered Date(s) Administered   Influenza, Seasonal, Injecte, Preservative Fre 08/19/2023   Influenza,inj,Quad PF,6+ Mos 08/19/2015, 06/17/2016, 06/22/2017, 06/23/2018, 08/12/2021, 08/13/2022   Influenza-Unspecified 07/24/2020   PFIZER(Purple Top)SARS-COV-2 Vaccination  12/27/2019, 01/17/2020, 10/10/2020   Pneumococcal Conjugate-13 06/17/2016   Pneumococcal Polysaccharide-23 02/11/2015   Tdap 11/18/2008, 09/26/2015    Past Medical History:  Diagnosis Date   Allergic rhinitis 07/05/2012   Constipation    chronic   Diabetes mellitus without complication (HCC)    No meds   Fatty liver disease, nonalcoholic    Gastroparesis    GERD , HH, h/o PUD 07/05/2012   Hyperlipidemia    IBS (irritable bowel syndrome) 11/06/2012   Obesity    OSA (obstructive sleep apnea)    mild   Seasonal allergies    Seasonal allergies    Sleep apnea     Tobacco History: Social History   Tobacco Use  Smoking Status Former   Current packs/day: 0.50   Average packs/day: 0.5 packs/day for 18.1 years (9.0 ttl pk-yrs)   Types: Cigarettes   Start date: 2007  Smokeless Tobacco Never  Tobacco Comments   On-off quit, currently not smoking 08-2021   Counseling given: Not Answered Tobacco comments: On-off quit, currently not smoking 08-2021   Outpatient Medications Prior to Visit  Medication Sig Dispense Refill   acetaminophen (TYLENOL) 500 MG tablet Take 1,000 mg by mouth every 6 (six) hours as needed for moderate pain.     atorvastatin (LIPITOR) 80 MG tablet Take 1 tablet (80 mg total) by mouth at bedtime. 90 tablet 1   empagliflozin (JARDIANCE) 25 MG TABS tablet Take 1 tablet (25 mg total) by mouth daily before breakfast. 90 tablet 1   fenofibrate (TRICOR) 145 MG tablet TAKE 1 TABLET BY MOUTH EVERY DAY 90 tablet 1   fluticasone (FLONASE) 50 MCG/ACT nasal spray Place into the nose.     fluticasone-salmeterol (WIXELA INHUB) 250-50 MCG/ACT AEPB Inhale 1 puff into the lungs in the morning and at bedtime. 60 each 5   metFORMIN (GLUCOPHAGE-XR) 750 MG 24 hr tablet Take 2 tablets (1,500 mg total) by mouth daily with breakfast. 180 tablet 1   metoCLOPramide (REGLAN) 5 MG tablet Take 1 tablet (5 mg total) by mouth in the morning and at bedtime. TAKE 1 TABLET BY MOUTH 2 TO 3  TIMES A DAY AS NEEDED. Please keep your March appointment for further refills. 180 tablet 3   omeprazole (PRILOSEC) 40 MG capsule Take 1 capsule (40 mg total) by mouth 2 (two) times daily. Please schedule an office visit for further refills. Thank you 180 capsule 3   polyethylene glycol (MIRALAX) 17 g packet Take 17 g by mouth daily. 14 each 0   Semaglutide (RYBELSUS) 7 MG TABS Take 1 tablet (7 mg total) by mouth daily. 90 tablet 0   No facility-administered medications prior to visit.     Review of Systems:   Constitutional: No weight loss or gain, night sweats, fevers, chills, or lassitude. +daytime fatigue HEENT: No headaches, difficulty swallowing, tooth/dental problems, or sore throat. No sneezing, itching, ear ache, nasal congestion, or post nasal drip  CV:  No chest pain, orthopnea, PND, swelling in lower extremities, anasarca, dizziness, palpitations, syncope Resp: No shortness of breath with exertion or at rest. No excess mucus or change in color of mucus. No productive or non-productive. No hemoptysis. No wheezing.  No chest wall deformity GI:  No heartburn, indigestion, abdominal pain, nausea, vomiting, diarrhea, change in bowel habits, loss of appetite GU: No dysuria, change in color of urine, urgency or frequency.  Skin: No rash, lesions, ulcerations MSK:  No joint pain or swelling.  Neuro: No dizziness or lightheadedness.  Psych: No depression or anxiety. Mood stable. +sleep disturbance    Physical Exam:  BP 104/68 (BP Location: Right Arm, Patient Position: Sitting, Cuff Size: Large)   Pulse 79   Temp 98.1 F (36.7 C) (Oral)   Ht 6' (1.829 m)   Wt 246 lb 6.4 oz (111.8 kg)   SpO2 96%   BMI 33.42 kg/m   GEN: Pleasant, interactive, well-appearing; obese; in no acute distress. HEENT:  Normocephalic and atraumatic. PERRLA. Sclera white. Nasal turbinates pink, moist and patent bilaterally. No rhinorrhea present. Oropharynx pink and moist, without exudate or edema. No  lesions, ulcerations, or postnasal drip. Mallampati III NECK:  Supple w/ fair ROM. No JVD present. Normal carotid impulses w/o bruits. Thyroid symmetrical with no goiter or nodules palpated. No lymphadenopathy.   CV: RRR, no m/r/g, no peripheral edema. Pulses intact, +2 bilaterally. No cyanosis, pallor or clubbing. PULMONARY:  Unlabored, regular breathing. Clear bilaterally A&P w/o wheezes/rales/rhonchi. No accessory muscle use.  GI: BS present and normoactive. Soft, non-tender to palpation. No organomegaly or masses detected.  MSK: No erythema, warmth or tenderness. Cap refil <2 sec all extrem. No deformities or joint swelling noted.  Neuro: A/Ox3. No focal deficits noted.   Skin: Warm, no lesions or rashe Psych: Normal affect and behavior. Judgement and thought content appropriate.     Lab Results:  CBC    Component Value Date/Time   WBC 9.5 08/19/2023 1417   RBC 4.98 08/19/2023 1417   HGB 14.7 08/19/2023 1417   HCT 43.3 08/19/2023 1417   PLT 393 08/19/2023 1417   MCV 86.9 08/19/2023 1417   MCH 29.5 08/19/2023 1417   MCHC 33.9 08/19/2023 1417   RDW 12.7 08/19/2023 1417   LYMPHSABS 2.1 08/13/2022 1316   MONOABS 0.4 08/13/2022 1316   EOSABS 38 08/19/2023 1417   BASOSABS 29 08/19/2023 1417    BMET    Component Value Date/Time   NA 140 08/19/2023 1417   K 4.2 08/19/2023 1417   CL 103 08/19/2023 1417   CO2 24 08/19/2023 1417   GLUCOSE 85 08/19/2023 1417   BUN 15 08/19/2023 1417   CREATININE 1.16 08/19/2023 1417   CALCIUM 9.9 08/19/2023 1417   GFRNONAA >60 04/14/2021 0209   GFRAA >60 05/10/2020 1138    BNP No results found for: "BNP"   Imaging:  No results found.  Administration History     None           No data to display          No results found for: "NITRICOXIDE"      Assessment & Plan:   OSA (obstructive sleep apnea) Mild OSA on CPAP. Excellent compliance and control. Receives benefit from use. May be due for new machine - orders placed  today. Advised him if he is not able to get a new machine, to take his machine to DME for troubleshooting. Aware of proper care/use of device. Safe driving practices reviewed.  Healthy weight loss encouraged.   Patient Instructions  Continue to use CPAP every night, minimum of 4-6 hours a night.  Change equipment as directed. Wash your tubing with warm soap and water daily, hang to dry. Wash humidifier portion weekly. Use bottled, distilled water and change daily Be aware of reduced alertness and do not drive or operate heavy machinery if experiencing this or drowsiness.  Exercise encouraged, as tolerated. Healthy weight management discussed.  Avoid or decrease alcohol consumption and medications that make you more sleepy, if possible. Notify if persistent daytime sleepiness occurs even with consistent use of PAP therapy.  We will put in orders for a new CPAP machine   Start doxepin 6 mg At bedtime as needed for sleep. Take immediately before bed. Ensure you have 7-8 hours in the bed after taking. Monitor for any mood changes or changes in sleep habits. Stop and notify immediately if these occur. Do not drive or operate heavy machinery after taking. Do not take with alcohol or other sedating medications. Ensure you apply your CPAP within 5-10 minutes of taking to avoid falling asleep without it. May cause some morning grogginess or vivid dreams.   Follow up in 8-10 weeks with Dr. Wynona Neat or Philis Nettle. If symptoms do not improve or worsen, please contact office for sooner follow up or seek emergency care.    Insomnia Persistent difficulties with sleep maintenance. Reviewed pharmacological options. Shared decision to trial doxepin 6 mg At bedtime as needed for sleep. Side effect/safety profile reviewed. Sleep hygiene reviewed.     I spent 35 minutes of dedicated to the care of this patient on the date of this encounter to include pre-visit review of records, face-to-face time with the patient  discussing conditions above, post visit ordering of testing, clinical documentation with the electronic health record, making appropriate referrals as documented, and communicating necessary findings to members of the patients care team.  Noemi Chapel, NP 11/07/2023  Pt aware and understands NP's role.

## 2023-11-11 ENCOUNTER — Telehealth: Payer: Self-pay

## 2023-11-11 ENCOUNTER — Other Ambulatory Visit (HOSPITAL_COMMUNITY): Payer: Self-pay

## 2023-11-11 NOTE — Telephone Encounter (Signed)
 Pharmacy Patient Advocate Encounter   Received notification from CoverMyMeds that prior authorization for Doxepin  HCl 6MG  tablets is required/requested.   Insurance verification completed.   The patient is insured through [employer] .   Per test claim: The current 30 day co-pay is, $22.91.  No PA needed at this time. This test claim was processed through [facility]- copay amounts may vary at other pharmacies due to pharmacy/plan contracts, or as the patient moves through the different stages of their insurance plan.

## 2023-11-14 ENCOUNTER — Other Ambulatory Visit: Payer: Self-pay

## 2023-11-14 MED ORDER — FENOFIBRATE 145 MG PO TABS: 145.0000 mg | ORAL_TABLET | Freq: Every day | ORAL | 1 refills | Status: DC

## 2023-11-14 NOTE — Telephone Encounter (Signed)
 Lm x1 for patient.

## 2023-11-15 ENCOUNTER — Encounter: Payer: Self-pay | Admitting: Internal Medicine

## 2023-11-15 DIAGNOSIS — F5101 Primary insomnia: Secondary | ICD-10-CM

## 2023-11-16 NOTE — Telephone Encounter (Signed)
Lm x2 for patient.  Will close encounter per office protocol.

## 2023-11-17 ENCOUNTER — Other Ambulatory Visit: Payer: Self-pay | Admitting: Internal Medicine

## 2023-11-17 NOTE — Telephone Encounter (Signed)
PT has left a MYCHART message (see below) I let PT know about PA for his meds. He is still unsure if his CPAP was approved. Please call PT to advise. He was asking for [facility] but she needed me to take a message. His # is [phone]   Delray Alt called and left a message asking me to call the office. I have tried twice to call but there has been 8-12 people ahead of me in the queue each time and I can't wait to speak to someone nor do I have the option to leave a message.  I am still waiting for my Doxepin to be pre-authorized and have not heard yet wether or not my new CPAP has been approved.   These are the only outstanding items I am aware of and Margie did not leave much in the voice message she left me.

## 2023-11-17 NOTE — Telephone Encounter (Signed)
I have sent an urgent message to [employer] but the order was just placed on 11/07/23

## 2023-11-17 NOTE — Telephone Encounter (Signed)
He is hard to reach o [facility] message would be fine if no answ at his #.

## 2023-11-17 NOTE — Telephone Encounter (Signed)
I received a message from Roberts with [employer] [facility], Elige Radon   This order was recived on 11-09-23 @ 1159am. We have been trying to obtain auth since 11-10-23. Last updarted from auth team was 11-16-23 1144am : Upon checking Berkley Harvey is Pending for clinicals review ,Pending [phone], For reference document uploaded in [facility], Hence need to follow up after 3 bizz days.  Once Berkley Harvey is obtain we will be able to move forward with processing.

## 2023-11-18 ENCOUNTER — Other Ambulatory Visit: Payer: Self-pay

## 2023-11-18 MED ORDER — METFORMIN HCL ER 750 MG PO TB24: 1500.0000 mg | ORAL_TABLET | Freq: Every day | ORAL | 1 refills | Status: DC

## 2023-11-18 NOTE — Telephone Encounter (Signed)
Order was sent to [employer] and they received it on 11/08/23 looks like Margie had called the patient in regards to meds

## 2023-11-22 MED ORDER — ESZOPICLONE 1 MG PO TABS: 1.0000 mg | ORAL_TABLET | Freq: Every evening | ORAL | 0 refills | Status: DC | PRN

## 2023-12-02 ENCOUNTER — Other Ambulatory Visit (HOSPITAL_COMMUNITY): Payer: Self-pay

## 2023-12-02 ENCOUNTER — Other Ambulatory Visit: Payer: Self-pay | Admitting: Nurse Practitioner

## 2023-12-02 ENCOUNTER — Telehealth: Payer: Self-pay

## 2023-12-02 DIAGNOSIS — F5101 Primary insomnia: Secondary | ICD-10-CM

## 2023-12-02 NOTE — Telephone Encounter (Signed)
*  Pulm  Pharmacy Patient Advocate Encounter  Received notification from [facility] that Prior Authorization for .Doxepin HCl 6MG  tablets  has been CANCELLED due to medication has been d/c-changed to [employer]    PA #/Case ID/Reference #: [id number]    Called patient pharmacy and l/m on therapy change from Doxepin to [facility]

## 2023-12-07 ENCOUNTER — Ambulatory Visit: Payer: 59 | Admitting: Internal Medicine

## 2023-12-07 ENCOUNTER — Encounter: Payer: Self-pay | Admitting: Internal Medicine

## 2023-12-07 VITALS — BP 116/68 | HR 58 | Temp 97.7°F | Resp 16 | Ht 72.0 in | Wt 245.2 lb

## 2023-12-07 DIAGNOSIS — E119 Type 2 diabetes mellitus without complications: Secondary | ICD-10-CM

## 2023-12-07 DIAGNOSIS — Z7984 Long term (current) use of oral hypoglycemic drugs: Secondary | ICD-10-CM | POA: Diagnosis not present

## 2023-12-07 LAB — BASIC METABOLIC PANEL
BUN: 12 mg/dL (ref 6–23)
CO2: 30 meq/L (ref 19–32)
Calcium: 9.8 mg/dL (ref 8.4–10.5)
Chloride: 102 meq/L (ref 96–112)
Creatinine, Ser: 1.16 mg/dL (ref 0.40–1.50)
GFR: 73.48 mL/min (ref >60.00)
Glucose, Bld: 97 mg/dL (ref 70–99)
Potassium: 3.9 meq/L (ref 3.5–5.1)
Sodium: 139 meq/L (ref 135–145)

## 2023-12-07 LAB — HEMOGLOBIN A1C: Hgb A1c MFr Bld: 6.5 % (ref 4.6–6.5)

## 2023-12-07 NOTE — Patient Instructions (Addendum)
 Consider get a continuous glucose monitor (CGM) over-the-counter  Watch your food portions, try to exercise 6 hours a week.    GO TO THE LAB : Get the blood work     Please go to the front desk and schedule the following: Follow-up in 3 to 4 months        Per our records you are due for your diabetic eye exam. Please contact your eye doctor to schedule an appointment. Please have them send copies of your office visit notes to [place]. Our fax number is [phone]. If you need a referral to an eye doctor please let us know.

## 2023-12-07 NOTE — Progress Notes (Unsigned)
 Subjective:    Patient ID: Wayne Melton, male    DOB: Mar 18, 1971, 53 y.o.   MRN: [id number]  DOS:  12/07/2023 Type of visit - description: Routine follow-up  Chronic medical problems addressed. Feeling well. No ambulatory CBGs. Started Rybelsus, denies side effects.    Wt Readings from Last 3 Encounters:  12/07/23 245 lb 4 oz (111.2 kg)  11/07/23 246 lb 6.4 oz (111.8 kg)  08/19/23 244 lb (110.7 kg)     Review of Systems See above   Past Medical History:  Diagnosis Date   Allergic rhinitis 07/05/2012   Constipation    chronic   Diabetes mellitus without complication (HCC)    No meds   Fatty liver disease, nonalcoholic    Gastroparesis    GERD , HH, h/o PUD 07/05/2012   Hyperlipidemia    IBS (irritable bowel syndrome) 11/06/2012   Obesity    OSA (obstructive sleep apnea)    mild   Seasonal allergies    Seasonal allergies    Sleep apnea     Past Surgical History:  Procedure Laterality Date   COLONOSCOPY     UPPER GASTROINTESTINAL ENDOSCOPY      Current Outpatient Medications  Medication Instructions   acetaminophen (TYLENOL) 1,000 mg, Every 6 hours PRN   atorvastatin (LIPITOR) 80 mg, Oral, Daily at bedtime   empagliflozin (JARDIANCE) 25 mg, Oral, Daily before breakfast   eszopiclone (LUNESTA) 1 mg, Oral, At bedtime PRN, Take immediately before bedtime   fenofibrate (TRICOR) 145 mg, Oral, Daily   fluticasone (FLONASE) 50 MCG/ACT nasal spray Place into the nose.   fluticasone-salmeterol (WIXELA INHUB) 250-50 MCG/ACT AEPB 1 puff, Inhalation, 2 times daily   metFORMIN (GLUCOPHAGE-XR) 1,500 mg, Oral, Daily with breakfast   metoCLOPramide (REGLAN) 5 mg, Oral, 2 times daily, TAKE 1 TABLET BY MOUTH 2 TO 3 TIMES A DAY AS NEEDED. Please keep your March appointment for further refills.   omeprazole (PRILOSEC) 40 mg, Oral, 2 times daily, Please schedule an office visit for further refills. Thank you   polyethylene glycol (MIRALAX) 17 g, Oral, Daily   Rybelsus 7  mg, Oral, Daily       Objective:   Physical Exam BP 116/68   Pulse (!) 58   Temp 97.7 F (36.5 C)   Resp 16   Ht 6' (1.829 m)   Wt 245 lb 4 oz (111.2 kg)   SpO2 96%   BMI 33.26 kg/m  General:   Well developed, NAD, BMI noted. HEENT:  Normocephalic . Face symmetric, atraumatic Lower extremities: no pretibial edema bilaterally  Skin: Not pale. Not jaundice Neurologic:  alert & oriented X3.  Speech normal, gait appropriate for age and unassisted Psych--  Cognition and judgment appear intact.  Cooperative with normal attention span and concentration.  Behavior appropriate. No anxious or depressed appearing.      Assessment     Assessment DM Hyperlipidemia Bronchospasm (on Qvar) GI: --h/o PUD (EGD @ [facility]  2002: neg  but reports had another EGD with ulcer) --GERD, IBS, occ constipation, gastroparesis (+ gastric emptying study October 2020) -- fatty liver per u/s 06-2015 -EGD 08-2017: Gastritis, polyps, benign findings per GI letter, no follow-up OSA:  (-)  home sleep apnea test 2014,  Full sleep study~08-2015 : mild OSA, saw pulmonary, rx dental appliance, wt loss HST 08-2018: Rx CPAP - DOE x years>>>>  neg stress test 03/2015 Abnormal brain MRI 05-2020 (subsequently saw neurosurgery, recommend observation, per patient, no documentation)  PLAN DM: Last A1c  increased slightly to 7.0, recommended Rybelsus - currently on 7 mg-   in addition to Jardiance and metformin.  Denies side effects from the new medication. No ambulatory CBGs. We had a long conversation about diet, exercise. Plan: Check A1c, and BMP.  Increase Rybelsus if needed.  Strongly encouraged to work on his lifestyle.  Rec to consider getting over-the-counter CGM. Dyslipidemia: At LOV, cholesterol was 204, LDL 127, triglycerides 309 (on atorvastatin 80 mg Tricor), referral to the lipid clinic, appointment pending. OSA: Saw pulmonary 11/07/2023, excellent CPAP compliance. RTC 4 months

## 2023-12-08 ENCOUNTER — Encounter: Payer: Self-pay | Admitting: Internal Medicine

## 2023-12-08 NOTE — Assessment & Plan Note (Signed)
 DM: Last A1c increased slightly to 7.0, recommended Rybelsus - currently on 7 mg-   in addition to Jardiance and metformin.  Denies side effects from the new medication. No ambulatory CBGs. We had a long conversation about diet, exercise. Plan: Check A1c, and BMP.  Increase Rybelsus if needed.  Strongly encouraged to work on his lifestyle.  Rec to consider getting over-the-counter CGM. Dyslipidemia: At LOV, cholesterol was 204, LDL 127, triglycerides 309 (on atorvastatin 80 mg Tricor), referral to the lipid clinic, appointment pending. OSA: Saw pulmonary 11/07/2023, excellent CPAP compliance. RTC 4 months

## 2023-12-13 ENCOUNTER — Ambulatory Visit: Payer: 59 | Admitting: Internal Medicine

## 2023-12-30 ENCOUNTER — Ambulatory Visit
Admission: RE | Admit: 2023-12-30 | Discharge: 2023-12-30 | Disposition: A | Discharge status: Home / Self Care | Source: Ambulatory Visit | Attending: Internal Medicine | Admitting: Internal Medicine

## 2023-12-30 VITALS — BP 103/70 | HR 88 | Temp 98.0°F | Resp 17

## 2023-12-30 DIAGNOSIS — H6123 Impacted cerumen, bilateral: Secondary | ICD-10-CM | POA: Diagnosis not present

## 2023-12-30 DIAGNOSIS — J0100 Acute maxillary sinusitis, unspecified: Secondary | ICD-10-CM

## 2023-12-30 MED ORDER — GUAIFENESIN ER 1200 MG PO TB12
1200.0000 mg | ORAL_TABLET | Freq: Two times a day (BID) | ORAL | 0 refills | Status: DC
Start: 2023-12-30 — End: 2024-01-17

## 2023-12-30 MED ORDER — AMOXICILLIN-POT CLAVULANATE 875-125 MG PO TABS: 1.0000 | ORAL_TABLET | Freq: Two times a day (BID) | ORAL | 0 refills | Status: DC

## 2023-12-30 NOTE — ED Triage Notes (Signed)
 Pt states symptoms began as nasal congestion 1 week ago and today his nose has been draining with some blood.

## 2023-12-30 NOTE — Discharge Instructions (Signed)
 Your evaluation shows you have a bacterial sinus infection. - Take antibiotic sent to pharmacy as directed to treat sinus infection. - Purchase Mucinex over the counter and take this every 12 hours as needed for nasal congestion and cough. - Warm compresses to the cheeks and forehead as needed to help with sinus headaches as well as tylenol as needed.  We flushed out the earwax from your ear(s) today.  Do not use Q-tips as this makes symptoms worse and pushes wax further into the ear canal.  You may use over-the-counter Debrox eardrops as needed for earwax removal at home as needed.  If you develop any new or worsening symptoms or if your symptoms do not start to improve, please return here or follow-up with your primary care provider. If your symptoms are severe, please go to the emergency room.

## 2023-12-30 NOTE — ED Provider Notes (Signed)
 [facility]    CSN: [id number] Arrival date & time: 12/30/23  0841      History   Chief Complaint Chief Complaint  Patient presents with   Nasal Congestion    Entered by patient    HPI Wayne Melton is a 53 y.o. male.   Wayne Melton is a 53 y.o. male presenting for chief complaint of nasal congestion and bilateral ear fullness that started 7 days ago. He states his wife works at a pre-school and frequently brings home viral illnesses. Reports cough, congestion, and intermittent sore throat.  Nasal drainage has been thick and green.  Reports history of allergies as well, takes Flonase and Allegra.  Denies headache, dizziness, drainage from the ears, fever, chills, shortness of breath, chest pain, tinnitus, nausea, vomiting, and rash.  He wears a CPAP at night due to sleep apnea and reports taking it off frequently overnight due to nasal congestion and inability to breathe through his nose.  Denies recent antibiotic or steroid use.  Using over-the-counter medications without any relief.     Past Medical History:  Diagnosis Date   Allergic rhinitis 07/05/2012   Constipation    chronic   Diabetes mellitus without complication (HCC)    No meds   Fatty liver disease, nonalcoholic    Gastroparesis    GERD , HH, h/o PUD 07/05/2012   Hyperlipidemia    IBS (irritable bowel syndrome) 11/06/2012   Obesity    OSA (obstructive sleep apnea)    mild   Seasonal allergies    Seasonal allergies    Sleep apnea     Patient Active Problem List   Diagnosis Date Noted   Insomnia 11/07/2023   Sleep disturbance 11/05/2022   Bronchospasm 02/10/2022   Cerebral aneurysm 05/14/2020   Gastroparesis 12/18/2019   DOE (dyspnea on exertion) 08/23/2019   BMI 35.0-35.9,adult 09/15/2018   OSA (obstructive sleep apnea) 10/14/2015   Follow-up ---------------PCP NOTES 06/12/2015   Diabetes mellitus without complication (HCC) 03/28/2015   Fatigue 11/08/2014   Annual physical exam  11/06/2012   IBS (irritable bowel syndrome) 11/06/2012   GERD , HH, h/o PUD 07/05/2012   Allergic rhinitis 07/05/2012   Hyperlipemia 07/05/2012    Past Surgical History:  Procedure Laterality Date   COLONOSCOPY     UPPER GASTROINTESTINAL ENDOSCOPY         Home Medications    Prior to Admission medications   Medication Sig Start Date End Date Taking? Authorizing Provider  amoxicillin-clavulanate (AUGMENTIN) 875-125 MG tablet Take 1 tablet by mouth every 12 (twelve) hours. 12/30/23  Yes Carlisle Beers, FNP  Guaifenesin 1200 MG TB12 Take 1 tablet (1,200 mg total) by mouth in the morning and at bedtime. 12/30/23  Yes Carlisle Beers, FNP  acetaminophen (TYLENOL) 500 MG tablet Take 1,000 mg by mouth every 6 (six) hours as needed for moderate pain.    [provider]  atorvastatin (LIPITOR) 80 MG tablet Take 1 tablet (80 mg total) by mouth at bedtime. 10/11/23   Wanda Plump, MD  empagliflozin (JARDIANCE) 25 MG TABS tablet Take 1 tablet (25 mg total) by mouth daily before breakfast. 10/04/23   Wanda Plump, MD  eszopiclone (LUNESTA) 1 MG TABS tablet Take 1 tablet (1 mg total) by mouth at bedtime as needed for sleep. Take immediately before bedtime 11/22/23   Cobb, Ruby Cola, NP  fenofibrate (TRICOR) 145 MG tablet Take 1 tablet (145 mg total) by mouth daily. 11/14/23   Wanda Plump,  MD  fluticasone (FLONASE) 50 MCG/ACT nasal spray Place into the nose.    [provider]  fluticasone-salmeterol (WIXELA INHUB) 250-50 MCG/ACT AEPB Inhale 1 puff into the lungs in the morning and at bedtime. 08/22/23   Wanda Plump, MD  metFORMIN (GLUCOPHAGE-XR) 750 MG 24 hr tablet Take 2 tablets (1,500 mg total) by mouth daily with breakfast. 11/18/23   Wanda Plump, MD  metoCLOPramide (REGLAN) 5 MG tablet Take 1 tablet (5 mg total) by mouth in the morning and at bedtime. TAKE 1 TABLET BY MOUTH 2 TO 3 TIMES A DAY AS NEEDED. Please keep your March appointment for further refills. 12/17/22  12/17/23  Benancio Deeds, MD  omeprazole (PRILOSEC) 40 MG capsule Take 1 capsule (40 mg total) by mouth 2 (two) times daily. Please schedule an office visit for further refills. Thank you 12/17/22   Armbruster, Willaim Rayas, MD  polyethylene glycol (MIRALAX) 17 g packet Take 17 g by mouth daily. 07/18/19   Armbruster, Willaim Rayas, MD  Semaglutide (RYBELSUS) 7 MG TABS Take 1 tablet (7 mg total) by mouth daily. 09/26/23   Wanda Plump, MD    Family History Family History  Problem Relation Age of Onset   Pancreatitis Mother    Diabetes Mother    Hyperlipidemia Mother    Colon polyps Mother        late 87s   Mitral valve prolapse Mother    Colon cancer Maternal Uncle        uncle in his 19s   Epilepsy Father    Crohn's disease Brother    Colon cancer Maternal Grandfather    CAD Other        GF in his 28s   Irritable bowel syndrome Other    Colitis Other    Heart disease Other    Prostate cancer Neg Hx     Social History Social History   Tobacco Use   Smoking status: Former    Current packs/day: 0.50    Average packs/day: 0.5 packs/day for 18.2 years (9.1 ttl pk-yrs)    Types: Cigarettes    Start date: 2007   Smokeless tobacco: Never   Tobacco comments:    On-off quit, currently not smoking 08-2021  Vaping Use   Vaping status: Never Used  Substance Use Topics   Alcohol use: Yes    Comment: rare   Drug use: Never     Allergies   Patient has no known allergies.   Review of Systems Review of Systems Per HPI  Physical Exam Triage Vital Signs ED Triage Vitals  Encounter Vitals Group     BP 12/30/23 0908 103/70     Systolic BP Percentile --      Diastolic BP Percentile --      Pulse Rate 12/30/23 0908 88     Resp 12/30/23 0908 17     Temp 12/30/23 0908 98 F (36.7 C)     Temp Source 12/30/23 0908 Oral     SpO2 12/30/23 0908 96 %     Weight --      Height --      Head Circumference --      Peak Flow --      Pain Score 12/30/23 0912 0     Pain Loc --       Pain Education --      Exclude from Growth Chart --    No data found.  Updated Vital Signs BP 103/70 (BP Location: Right Arm)  Pulse 88   Temp 98 F (36.7 C) (Oral)   Resp 17   SpO2 96%   Visual Acuity Right Eye Distance:   Left Eye Distance:   Bilateral Distance:    Right Eye Near:   Left Eye Near:    Bilateral Near:     Physical Exam Vitals and nursing note reviewed.  Constitutional:      Appearance: He is not ill-appearing or toxic-appearing.  HENT:     Head: Normocephalic and atraumatic.     Right Ear: Hearing, tympanic membrane, ear canal and external ear normal. There is impacted cerumen.     Left Ear: Hearing, tympanic membrane, ear canal and external ear normal. There is impacted cerumen.     Nose: Congestion present.     Right Turbinates: Swollen.     Left Turbinates: Swollen.     Right Sinus: Maxillary sinus tenderness present.     Left Sinus: Maxillary sinus tenderness present.     Mouth/Throat:     Lips: Pink.  Eyes:     General: Lids are normal. Vision grossly intact. Gaze aligned appropriately.     Extraocular Movements: Extraocular movements intact.     Conjunctiva/sclera: Conjunctivae normal.  Pulmonary:     Effort: Pulmonary effort is normal.  Musculoskeletal:     Cervical back: Neck supple.  Skin:    General: Skin is warm and dry.     Capillary Refill: Capillary refill takes less than 2 seconds.     Findings: No rash.  Neurological:     General: No focal deficit present.     Mental Status: He is alert and oriented to person, place, and time. Mental status is at baseline.     Cranial Nerves: No dysarthria or facial asymmetry.  Psychiatric:        Mood and Affect: Mood normal.        Speech: Speech normal.        Behavior: Behavior normal.        Thought Content: Thought content normal.        Judgment: Judgment normal.      UC Treatments / Results  Labs (all labs ordered are listed, but only abnormal results are displayed) Labs  Reviewed - No data to display  EKG   Radiology No results found.  Procedures Procedures (including critical care time)  Medications Ordered in UC Medications - No data to display  Initial Impression / Assessment and Plan / UC Course  I have reviewed the triage vital signs and the nursing notes.  Pertinent labs & imaging results that were available during my care of the patient were reviewed by me and considered in my medical decision making (see chart for details).   1. Acute non-recurrent maxillary sinusitis, bilateral impacted cerumen Presentation is consistent with acute postviral bacterial sinusitis.   Symptoms have been present for greater than 7 days and have not responded well to over-the-counter therapies, therefore may have antibiotic.  Prescriptions for further symptomatic relief sent, may continue using OTC medications as needed. Deferred imaging of the chest based on stable cardiopulmonary exam and hemodynamically stable vital signs. Recommend warm compresses to the sinuses as needed.   Both ear(s) cleaned with ear lavage to remove ear wax impactions bilaterally by nursing staff.  Reassessment shows normal tympanic membranes bilaterally without infection or perforation.  Patient may use debrox ear drops at home as needed for wax removal and has been advised to avoid using Q-tips.   Counseled patient on potential  for adverse effects with medications prescribed/recommended today, strict ER and return-to-clinic precautions discussed, patient verbalized understanding.    Final Clinical Impressions(s) / UC Diagnoses   Final diagnoses:  Acute non-recurrent maxillary sinusitis  Bilateral impacted cerumen     Discharge Instructions      Your evaluation shows you have a bacterial sinus infection. - Take antibiotic sent to pharmacy as directed to treat sinus infection. - Purchase Mucinex over the counter and take this every 12 hours as needed for nasal congestion and  cough. - Warm compresses to the cheeks and forehead as needed to help with sinus headaches as well as tylenol as needed.  We flushed out the earwax from your ear(s) today.  Do not use Q-tips as this makes symptoms worse and pushes wax further into the ear canal.  You may use over-the-counter Debrox eardrops as needed for earwax removal at home as needed.  If you develop any new or worsening symptoms or if your symptoms do not start to improve, please return here or follow-up with your primary care provider. If your symptoms are severe, please go to the emergency room.     ED Prescriptions     Medication Sig Dispense Auth. Provider   Guaifenesin 1200 MG TB12 Take 1 tablet (1,200 mg total) by mouth in the morning and at bedtime. 14 tablet Carlisle Beers, FNP   amoxicillin-clavulanate (AUGMENTIN) 875-125 MG tablet Take 1 tablet by mouth every 12 (twelve) hours. 14 tablet Carlisle Beers, FNP      PDMP not reviewed this encounter.   Carlisle Beers, [place] 12/30/23 1021

## 2024-01-02 ENCOUNTER — Other Ambulatory Visit: Payer: Self-pay

## 2024-01-02 MED ORDER — OMEPRAZOLE 40 MG PO CPDR: 40.0000 mg | DELAYED_RELEASE_CAPSULE | Freq: Two times a day (BID) | ORAL | 0 refills | Status: DC

## 2024-01-02 NOTE — Progress Notes (Signed)
 Refill request received from [facility] for omeprazole 40 mg BID.  Patient has May appointment.  Refill sent to get to May appt

## 2024-01-08 ENCOUNTER — Other Ambulatory Visit: Payer: Self-pay | Admitting: Gastroenterology

## 2024-01-08 ENCOUNTER — Encounter: Payer: Self-pay | Admitting: Internal Medicine

## 2024-01-08 DIAGNOSIS — G4733 Obstructive sleep apnea (adult) (pediatric): Secondary | ICD-10-CM | POA: Diagnosis not present

## 2024-01-09 MED ORDER — RYBELSUS 7 MG PO TABS
7.0000 mg | ORAL_TABLET | Freq: Every day | ORAL | 1 refills | Status: DC
Start: 2024-01-09 — End: 2024-06-14

## 2024-01-09 MED ORDER — EMPAGLIFLOZIN 25 MG PO TABS: 25.0000 mg | ORAL_TABLET | Freq: Every day | ORAL | 1 refills | Status: DC

## 2024-01-17 ENCOUNTER — Ambulatory Visit: Admitting: Internal Medicine

## 2024-01-17 ENCOUNTER — Encounter: Payer: Self-pay | Admitting: Internal Medicine

## 2024-01-17 ENCOUNTER — Ambulatory Visit: Payer: 59 | Admitting: Nurse Practitioner

## 2024-01-17 VITALS — BP 120/70 | HR 78 | Temp 97.7°F | Resp 16 | Ht 72.0 in | Wt 245.0 lb

## 2024-01-17 DIAGNOSIS — J302 Other seasonal allergic rhinitis: Secondary | ICD-10-CM | POA: Diagnosis not present

## 2024-01-17 DIAGNOSIS — J9801 Acute bronchospasm: Secondary | ICD-10-CM | POA: Diagnosis not present

## 2024-01-17 MED ORDER — AZELASTINE HCL 0.1 % NA SOLN: 2.0000 | Freq: Two times a day (BID) | NASAL | Status: AC

## 2024-01-17 NOTE — Patient Instructions (Signed)
 Allegra 180 mg 1 Tab a day or 1/2 tab twice a day   For cough:  Take Mucinex DM or Robitussin-DM OTC.  Follow the instructions in the box.   For nasal congestion: -Use over-the-counter Flonase: 2 nasal sprays on each side of the nose in the morning until you feel better  -Use OTC Astepro 2 nasal sprays on each side of the nose twice daily until better  Avoid decongestants such as  Pseudoephedrine or phenylephrine     Call if not gradually better over the next  10 days

## 2024-01-17 NOTE — Progress Notes (Unsigned)
 Subjective:    Patient ID: [facility], male    DOB: [birth year], 53 y.o.   MRN: [id number]  DOS:  01/17/2024 Type of visit - description: Urgent care follow-up  Symptoms started approximately 12/23/2023. Sinus congestion with some sinus bleeding. Sneezing, itchy eyes. He felt it was related to pollen, also wife had a URI. Denies any fever or chills.  With above symptoms, went to urgent care on 12/30/2023: Was Rx Augmentin, guaifenesin. Initially felt better, then symptoms resurface 3 days ago. Fortunately at this point symptoms are gradually decreasing.  Review of Systems See above   Past Medical History:  Diagnosis Date   Allergic rhinitis 07/05/2012   Constipation    chronic   Diabetes mellitus without complication (HCC)    No meds   Fatty liver disease, nonalcoholic    Gastroparesis    GERD , HH, h/o PUD 07/05/2012   Hyperlipidemia    IBS (irritable bowel syndrome) 11/06/2012   Obesity    OSA (obstructive sleep apnea)    mild   Seasonal allergies    Seasonal allergies    Sleep apnea     Past Surgical History:  Procedure Laterality Date   COLONOSCOPY     UPPER GASTROINTESTINAL ENDOSCOPY      Current Outpatient Medications  Medication Instructions   acetaminophen (TYLENOL) 1,000 mg, Every 6 hours PRN   atorvastatin (LIPITOR) 80 mg, Oral, Daily at bedtime   azelastine (ASTELIN) 0.1 % nasal spray 2 sprays, Each Nare, 2 times daily   empagliflozin (JARDIANCE) 25 mg, Oral, Daily before breakfast   eszopiclone (LUNESTA) 1 mg, Oral, At bedtime PRN, Take immediately before bedtime   fenofibrate (TRICOR) 145 mg, Oral, Daily   fluticasone (FLONASE) 50 MCG/ACT nasal spray Place into the nose.   fluticasone-salmeterol (WIXELA INHUB) 250-50 MCG/ACT AEPB 1 puff, Inhalation, 2 times daily   metFORMIN (GLUCOPHAGE-XR) 1,500 mg, Oral, Daily with breakfast   metoCLOPramide (REGLAN) 5 mg, Oral, 2 times daily, TAKE 1 TABLET BY MOUTH 2 TO 3 TIMES A DAY AS NEEDED. Please keep  your May 15 appointment for further refills.   omeprazole (PRILOSEC) 40 mg, Oral, 2 times daily, Please keep your May appointment for further refills. Thank you   polyethylene glycol (MIRALAX) 17 g, Oral, Daily   Rybelsus 7 mg, Oral, Daily       Objective:   Physical Exam BP 120/70   Pulse 78   Temp 97.7 F (36.5 C) (Oral)   Resp 16   Ht 6' (1.829 m)   Wt 245 lb (111.1 kg)   SpO2 97%   BMI 33.23 kg/m  General:   Well developed, NAD, BMI noted. HEENT:  Normocephalic . Face symmetric, atraumatic Throat symmetric not red. Nose slightly congested Sinuses no TTP. TMs: Slightly bulged but not red. Lungs:  CTA B Normal respiratory effort, no intercostal retractions, no accessory muscle use. Heart: RRR,  no murmur.  Lower extremities: no pretibial edema bilaterally  Skin: Not pale. Not jaundice Neurologic:  alert & oriented X3.  Speech normal, gait appropriate for age and unassisted Psych--  Cognition and judgment appear intact.  Cooperative with normal attention span and concentration.  Behavior appropriate. No anxious or depressed appearing.      Assessment   Assessment DM Hyperlipidemia Bronchospasm (on Qvar) GI: --h/o PUD (EGD @ [facility]  2002: neg  but reports had another EGD with ulcer) --GERD, IBS, occ constipation, gastroparesis (+ gastric emptying study October 2020) -- fatty liver per u/s 06-2015 -EGD 08-2017: Gastritis,  polyps, benign findings per GI letter, no follow-up OSA:  (-)  home sleep apnea test 2014,  Full sleep study~08-2015 : mild OSA, saw pulmonary, rx dental appliance, wt loss HST 08-2018: Rx CPAP - DOE x years>>>>  neg stress test 03/2015 Abnormal brain MRI 05-2020 (subsequently saw neurosurgery, recommend observation, per patient, no documentation)  PLAN Rhinitis, sinusitis: Symptoms as described above, went to urgent care, had Augmentin. He is also using Flonase and Astepro. Overall he is gradually improving. Plan: Add Allegra, try to avoid  being exposed to pollen.  Call if not gradually better Bronchospasm: Fortunately this has not been exacerbated lately. Follow-up scheduled for June

## 2024-01-18 NOTE — Assessment & Plan Note (Signed)
 Rhinitis, sinusitis: Symptoms as described above, went to urgent care, had Augmentin. He is also using Flonase and Astepro. Overall he is gradually improving. Plan: Add Allegra, try to avoid being exposed to pollen.  Call if not gradually better Bronchospasm: Fortunately this has not been exacerbated lately. Follow-up scheduled for June

## 2024-01-23 ENCOUNTER — Ambulatory Visit: Payer: 59 | Admitting: Nurse Practitioner

## 2024-01-23 ENCOUNTER — Encounter: Payer: Self-pay | Admitting: Nurse Practitioner

## 2024-01-23 VITALS — BP 134/80 | HR 72 | Ht 72.0 in | Wt 243.4 lb

## 2024-01-23 DIAGNOSIS — F5101 Primary insomnia: Secondary | ICD-10-CM

## 2024-01-23 DIAGNOSIS — G4733 Obstructive sleep apnea (adult) (pediatric): Secondary | ICD-10-CM | POA: Diagnosis not present

## 2024-01-23 NOTE — Progress Notes (Signed)
 @Patient  ID: Wayne Melton, male    DOB: 12/21/1970, 53 y.o.   MRN: [id number]  Chief Complaint  Patient presents with   Follow-up    Follow up on cpap    Referring provider: Ezell Hollow, MD  HPI: 53 year old male, former smoker referred for sleep consult/re-establish care. He is a former patient of Dr. Carlyle Melton and last seen in office 11/07/2023 by [facility] NP. Past medical history significant for allergic rhinitis, OSA on CPAP, GERD, IBS, DM, HLD.   TEST/EVENTS:  08/10/2018 HST: AHI 13.9/h, SpO2 low 76%  11/05/2022: OV with Wayne Akhtar NP for sleep consult by Dr. Neomi Melton. He has been on CPAP for many years now. Last seen in our office in 2019. He would like to re-establish care here. He has been having some trouble with his nighttime sleep for the past few months. Wakes around 3-4 AM and has a lot of trouble getting back to sleep. He also occasionally wakes up sweating. He's tried melatonin 10 mg but hasn't had much success with this. He does feel more tired during the day with this. He's worried it may be related to his CPAP not working properly; although, he doesn't have any issues with the machine to correlate this to. He does have some life stressors in his job. He denies morning headaches, snoring, dry mouth, drowsy driving, sleep parasomnias/paralysis. No history of narcolepsy or cataplexy. Goes to bed around 10-11 pm. Falls asleep within 20-30 min. Wakes 4-5 times a night; extended period around 3-4 AM. Gets up at 6:30-6:45 AM. He is on CPAP 5-15 cmH2O with large full face mask. Fits well without significant leaks. He doesn't have a SD card but does have download available on the app on his phone. He has gained 10-15 lb in the last two years.  History of mild asthma on pulmicort  and well controlled. He has diabetes which is controlled with metformin . No history of stroke. He is a former smoker; quit 2022. Drinks alcohol 2-3 times a year. Drinks 2-3 cups of coffee a day; last cup around 1030 am. Lives  with his wife and two sons. 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 Family history of seasonal allergies, asthma, and heart disease.  Epworth 3 10/05/2022-11/04/2022 CPAP 5-15 cmH2O  29/30 days; 96.7% >4 hr; av use 8 hours 24 min AHI on download <2.5/h  No significant leaks  11/07/2023: OV with Wayne Cisse NP for yearly follow-up.  Wears a CPAP every night.  Feels unchanged compared to his last visit.  Does feel better when he uses his CPAP.  Still having trouble with his sleep at night.  Tends to fall asleep easily but wakes up frequently, especially around 3 to 4 AM and has trouble getting back to sleep.  This does cause some daytime sleepiness symptoms.  He has tried melatonin without much success.  We had discussed potential of sleep aids last time but he was hesitant to do this.  He would like to discuss this again today. He thinks he may be due for a new CPAP machine.  He got his original machine in 2019.  It was recalled and replaced with a refurbished machine.  He is having some difficulties with the humidifier attachment being broken as well as some of the buttons not working like they are supposed to.  He has not reach out to his DME company regarding this.  Denies any drowsy driving or morning headaches.  No history of sleep parasomnia/paralysis. He does have  a history of asthma and is on Pulmicort .  It is well-controlled.  Has not had any recent exacerbations.  Followed by his PCP for this. 10/08/2023-11/06/2023 CPAP 5-15 cmH2O 30/30 days; 100% >4 hr; average use 8 hr 8 min AHI on download <2.5/h No significant leaks   01/23/2024: Today - follow up Patient presents today for follow up. I had prescribed doxepin  at his last visit due to sleep maintenance trouble despite CPAP use; however it was not covered by insurance. Shared decision to trial lunesta , which he has started. He took 1 mg and tried it a few different times. It would help him fall asleep more quickly, although this was never really  an issue for him; but wouldn't help him stay asleep. He never tried a higher dose. No issues with morning grogginess or any mood changes. Tolerated well.  He's wearing his CPAP nightly. Got a new machine. He does feel like it's leaking more than his old machine. Has to adjust the mask more. He is wearing the same mask he was previously. He's currently on auto 5-15 cmH2O. He denies any issues with morning headaches or drowsy driving.   12/21/2023-01/19/2024: CPAP 5-15 cmH2O 29/30 days; 97% >4 hr; average use 7 hr 31 min Pressure 95th 14.1 Leaks 95th 28.4  AHI 0.3   No Known Allergies  Immunization History  Administered Date(s) Administered   Influenza, Seasonal, Injecte, Preservative Fre 08/19/2023   Influenza,inj,Quad PF,6+ Mos 08/19/2015, 06/17/2016, 06/22/2017, 06/23/2018, 08/12/2021, 08/13/2022   Influenza-Unspecified 07/24/2020   PFIZER(Purple Top)SARS-COV-2 Vaccination 12/27/2019, 01/17/2020, 10/10/2020   Pneumococcal Conjugate-13 06/17/2016   Pneumococcal Polysaccharide-23 02/11/2015   Tdap 11/18/2008, 09/26/2015    Past Medical History:  Diagnosis Date   Allergic rhinitis 07/05/2012   Constipation    chronic   Diabetes mellitus without complication (HCC)    No meds   Fatty liver disease, nonalcoholic    Gastroparesis    GERD , HH, h/o PUD 07/05/2012   Hyperlipidemia    IBS (irritable bowel syndrome) 11/06/2012   Obesity    OSA (obstructive sleep apnea)    mild   Seasonal allergies    Seasonal allergies    Sleep apnea     Tobacco History: Social History   Tobacco Use  Smoking Status Former   Current packs/day: 0.50   Average packs/day: 0.5 packs/day for 18.3 years (9.2 ttl pk-yrs)   Types: Cigarettes   Start date: 2007  Smokeless Tobacco Never  Tobacco Comments   On-off quit, currently not smoking 08-2021   Counseling given: Not Answered Tobacco comments: On-off quit, currently not smoking 08-2021   Outpatient Medications Prior to Visit  Medication Sig  Dispense Refill   acetaminophen  (TYLENOL ) 500 MG tablet Take 1,000 mg by mouth every 6 (six) hours as needed for moderate pain.     atorvastatin  (LIPITOR) 80 MG tablet Take 1 tablet (80 mg total) by mouth at bedtime. 90 tablet 1   azelastine  (ASTELIN ) 0.1 % nasal spray Place 2 sprays into both nostrils 2 (two) times daily.     empagliflozin  (JARDIANCE ) 25 MG TABS tablet Take 1 tablet (25 mg total) by mouth daily before breakfast. 90 tablet 1   eszopiclone  (LUNESTA ) 1 MG TABS tablet Take 1 tablet (1 mg total) by mouth at bedtime as needed for sleep. Take immediately before bedtime 30 tablet 0   fenofibrate  (TRICOR ) 145 MG tablet Take 1 tablet (145 mg total) by mouth daily. 90 tablet 1   fluticasone  (FLONASE ) 50 MCG/ACT nasal spray Place into the  nose.     fluticasone -salmeterol (WIXELA INHUB) 250-50 MCG/ACT AEPB Inhale 1 puff into the lungs in the morning and at bedtime. 60 each 5   metFORMIN  (GLUCOPHAGE -XR) 750 MG 24 hr tablet Take 2 tablets (1,500 mg total) by mouth daily with breakfast. 180 tablet 1   metoCLOPramide  (REGLAN ) 5 MG tablet Take 1 tablet (5 mg total) by mouth in the morning and at bedtime. TAKE 1 TABLET BY MOUTH 2 TO 3 TIMES A DAY AS NEEDED. Please keep your May 15 appointment for further refills. 60 tablet 1   omeprazole  (PRILOSEC) 40 MG capsule Take 1 capsule (40 mg total) by mouth 2 (two) times daily. Please keep your May appointment for further refills. Thank you 180 capsule 0   polyethylene glycol (MIRALAX ) 17 g packet Take 17 g by mouth daily. 14 each 0   Semaglutide  (RYBELSUS ) 7 MG TABS Take 1 tablet (7 mg total) by mouth daily. 90 tablet 1   No facility-administered medications prior to visit.     Review of Systems:   Constitutional: No weight loss or gain, night sweats, fevers, chills, or lassitude. +daytime fatigue HEENT: No headaches, difficulty swallowing, tooth/dental problems, or sore throat. No sneezing, itching, ear ache, nasal congestion, or post nasal drip CV:   No chest pain, orthopnea, PND, swelling in lower extremities, anasarca, dizziness, palpitations, syncope Resp: No shortness of breath with exertion or at rest. No excess mucus or change in color of mucus. No productive or non-productive. No hemoptysis. No wheezing.  No chest wall deformity GI:  No heartburn, indigestion, abdominal pain, nausea, vomiting, diarrhea, change in bowel habits, loss of appetite GU: No dysuria, change in color of urine, urgency or frequency.  Skin: No rash, lesions, ulcerations MSK:  No joint pain or swelling.  Neuro: No dizziness or lightheadedness.  Psych: No depression or anxiety. Mood stable. +sleep disturbance    Physical Exam:  BP 134/80 (BP Location: Right Arm, Patient Position: Sitting, Cuff Size: Normal)   Pulse 72   Ht 6' (1.829 m)   Wt 243 lb 6.4 oz (110.4 kg)   SpO2 96%   BMI 33.01 kg/m   GEN: Pleasant, interactive, well-appearing; obese; in no acute distress. HEENT:  Normocephalic and atraumatic. PERRLA. Sclera white. Nasal turbinates pink, moist and patent bilaterally. No rhinorrhea present. Oropharynx pink and moist, without exudate or edema. No lesions, ulcerations, or postnasal drip. Mallampati III NECK:  Supple w/ fair ROM. No JVD present. Normal carotid impulses w/o bruits. Thyroid  symmetrical with no goiter or nodules palpated. No lymphadenopathy.   CV: RRR, no m/r/g, no peripheral edema. Pulses intact, +2 bilaterally. No cyanosis, pallor or clubbing. PULMONARY:  Unlabored, regular breathing. Clear bilaterally A&P w/o wheezes/rales/rhonchi. No accessory muscle use.  GI: BS present and normoactive. Soft, non-tender to palpation. No organomegaly or masses detected.  MSK: No erythema, warmth or tenderness. Cap refil <2 sec all extrem. No deformities or joint swelling noted.  Neuro: A/Ox3. No focal deficits noted.   Skin: Warm, no lesions or rashe Psych: Normal affect and behavior. Judgement and thought content appropriate.     Lab  Results:  CBC    Component Value Date/Time   WBC 9.5 08/19/2023 1417   RBC 4.98 08/19/2023 1417   HGB 14.7 08/19/2023 1417   HCT 43.3 08/19/2023 1417   PLT 393 08/19/2023 1417   MCV 86.9 08/19/2023 1417   MCH 29.5 08/19/2023 1417   MCHC 33.9 08/19/2023 1417   RDW 12.7 08/19/2023 1417   LYMPHSABS 2.1 08/13/2022  1316   MONOABS 0.4 08/13/2022 1316   EOSABS 38 08/19/2023 1417   BASOSABS 29 08/19/2023 1417    BMET    Component Value Date/Time   NA 139 12/07/2023 1136   K 3.9 12/07/2023 1136   CL 102 12/07/2023 1136   CO2 30 12/07/2023 1136   GLUCOSE 97 12/07/2023 1136   BUN 12 12/07/2023 1136   CREATININE 1.16 12/07/2023 1136   CREATININE 1.16 08/19/2023 1417   CALCIUM  9.8 12/07/2023 1136   GFRNONAA >60 04/14/2021 0209   GFRAA >60 05/10/2020 1138    BNP No results found for: "BNP"   Imaging:  No results found.  Administration History     None           No data to display          No results found for: "NITRICOXIDE"      Assessment & Plan:   OSA (obstructive sleep apnea) Mild OSA on CPAP. Excellent compliance and control. Receives benefit from use. Aware of proper care/use of device. More leaks with new machine despite utilizing the same mask. Will adjust his pressure settings to 8-13 cmH2O. Advised him to notify if this does not improve leaks. Encouraged to continue utilizing nightly. Safe driving practices reviewed. Healthy weight loss encouraged.  Patient Instructions  Continue to use CPAP every night, minimum of 4-6 hours a night.  Change equipment as directed. Wash your tubing with warm soap and water daily, hang to dry. Wash humidifier portion weekly. Use bottled, distilled water and change daily Be aware of reduced alertness and do not drive or operate heavy machinery if experiencing this or drowsiness.  Exercise encouraged, as tolerated. Healthy weight management discussed.  Avoid or decrease alcohol consumption and medications that make  you more sleepy, if possible. Notify if persistent daytime sleepiness occurs even with consistent use of PAP therapy.   Adjust CPAP settings to 8-13 cmH2O   Continue lunesta  At bedtime as needed for sleep. Increase to 2 mg and see how this helps keep you asleep. If you're still waking up, you can go up to 3 mg (3 tablets). Take immediately before bed. Ensure you have 7-8 hours in the bed after taking. Monitor for any mood changes or changes in sleep habits. Stop and notify immediately if these occur. Do not drive or operate heavy machinery after taking. Do not take with alcohol or other sedating medications. Ensure you apply your CPAP within 5-10 minutes of taking to avoid falling asleep without it. May cause some morning grogginess or vivid dreams.    Let me know if you're still having night awakenings   Follow up in 6 months with Katie Julianna Vanwagner,NP. If symptoms do not improve or worsen, please contact office for sooner follow up or seek emergency care.    Insomnia Difficulties with sleep maintenance. Tolerating lunesta  well but not entirely effective. Will have him increase to 2-3 mg and reassess response. If no improvement or negative side effects, will consider alternative medication options. Aware of safety profile and proper use. Sleep hygiene reviewed.      I spent 35 minutes of dedicated to the care of this patient on the date of this encounter to include pre-visit review of records, face-to-face time with the patient discussing conditions above, post visit ordering of testing, clinical documentation with the electronic health record, making appropriate referrals as documented, and communicating necessary findings to members of the patients care team.  Roetta Clarke, NP 01/23/2024  Pt aware and understands  NP's role.

## 2024-01-23 NOTE — Patient Instructions (Addendum)
 Continue to use CPAP every night, minimum of 4-6 hours a night.  Change equipment as directed. Wash your tubing with warm soap and water daily, hang to dry. Wash humidifier portion weekly. Use bottled, distilled water and change daily Be aware of reduced alertness and do not drive or operate heavy machinery if experiencing this or drowsiness.  Exercise encouraged, as tolerated. Healthy weight management discussed.  Avoid or decrease alcohol consumption and medications that make you more sleepy, if possible. Notify if persistent daytime sleepiness occurs even with consistent use of PAP therapy.   Adjust CPAP settings to 8-13 cmH2O   Continue lunesta  At bedtime as needed for sleep. Increase to 2 mg and see how this helps keep you asleep. If you're still waking up, you can go up to 3 mg (3 tablets). Take immediately before bed. Ensure you have 7-8 hours in the bed after taking. Monitor for any mood changes or changes in sleep habits. Stop and notify immediately if these occur. Do not drive or operate heavy machinery after taking. Do not take with alcohol or other sedating medications. Ensure you apply your CPAP within 5-10 minutes of taking to avoid falling asleep without it. May cause some morning grogginess or vivid dreams.    Let me know if you're still having night awakenings   Follow up in 6 months with Katie Ahron Hulbert,NP. If symptoms do not improve or worsen, please contact office for sooner follow up or seek emergency care.

## 2024-01-23 NOTE — Assessment & Plan Note (Signed)
 Mild OSA on CPAP. Excellent compliance and control. Receives benefit from use. Aware of proper care/use of device. More leaks with new machine despite utilizing the same mask. Will adjust his pressure settings to 8-13 cmH2O. Advised him to notify if this does not improve leaks. Encouraged to continue utilizing nightly. Safe driving practices reviewed. Healthy weight loss encouraged.  Patient Instructions  Continue to use CPAP every night, minimum of 4-6 hours a night.  Change equipment as directed. Wash your tubing with warm soap and water daily, hang to dry. Wash humidifier portion weekly. Use bottled, distilled water and change daily Be aware of reduced alertness and do not drive or operate heavy machinery if experiencing this or drowsiness.  Exercise encouraged, as tolerated. Healthy weight management discussed.  Avoid or decrease alcohol consumption and medications that make you more sleepy, if possible. Notify if persistent daytime sleepiness occurs even with consistent use of PAP therapy.   Adjust CPAP settings to 8-13 cmH2O   Continue lunesta  At bedtime as needed for sleep. Increase to 2 mg and see how this helps keep you asleep. If you're still waking up, you can go up to 3 mg (3 tablets). Take immediately before bed. Ensure you have 7-8 hours in the bed after taking. Monitor for any mood changes or changes in sleep habits. Stop and notify immediately if these occur. Do not drive or operate heavy machinery after taking. Do not take with alcohol or other sedating medications. Ensure you apply your CPAP within 5-10 minutes of taking to avoid falling asleep without it. May cause some morning grogginess or vivid dreams.    Let me know if you're still having night awakenings   Follow up in 6 months with Katie Rudy Domek,NP. If symptoms do not improve or worsen, please contact office for sooner follow up or seek emergency care.

## 2024-01-23 NOTE — Assessment & Plan Note (Signed)
 Difficulties with sleep maintenance. Tolerating lunesta  well but not entirely effective. Will have him increase to 2-3 mg and reassess response. If no improvement or negative side effects, will consider alternative medication options. Aware of safety profile and proper use. Sleep hygiene reviewed.

## 2024-01-30 ENCOUNTER — Encounter: Payer: Self-pay | Admitting: Internal Medicine

## 2024-01-30 DIAGNOSIS — F5101 Primary insomnia: Secondary | ICD-10-CM

## 2024-01-30 MED ORDER — ESZOPICLONE 1 MG PO TABS: 1.0000 mg | ORAL_TABLET | Freq: Every evening | ORAL | 5 refills | Status: AC | PRN

## 2024-01-30 MED ORDER — ATORVASTATIN CALCIUM 80 MG PO TABS: 80.0000 mg | ORAL_TABLET | Freq: Every day | ORAL | 1 refills | Status: DC

## 2024-01-30 NOTE — Telephone Encounter (Signed)
 Rx sent

## 2024-02-07 ENCOUNTER — Ambulatory Visit (HOSPITAL_BASED_OUTPATIENT_CLINIC_OR_DEPARTMENT_OTHER): Payer: 59 | Admitting: Internal Medicine

## 2024-02-07 ENCOUNTER — Encounter (HOSPITAL_BASED_OUTPATIENT_CLINIC_OR_DEPARTMENT_OTHER): Payer: Self-pay | Admitting: Internal Medicine

## 2024-02-07 VITALS — BP 100/70 | HR 84 | Ht 72.0 in | Wt 239.0 lb

## 2024-02-07 DIAGNOSIS — Z6835 Body mass index (BMI) 35.0-35.9, adult: Secondary | ICD-10-CM | POA: Diagnosis not present

## 2024-02-07 DIAGNOSIS — E782 Mixed hyperlipidemia: Secondary | ICD-10-CM | POA: Diagnosis not present

## 2024-02-07 DIAGNOSIS — G4733 Obstructive sleep apnea (adult) (pediatric): Secondary | ICD-10-CM | POA: Diagnosis not present

## 2024-02-07 DIAGNOSIS — E119 Type 2 diabetes mellitus without complications: Secondary | ICD-10-CM | POA: Diagnosis not present

## 2024-02-07 NOTE — Patient Instructions (Signed)
 Medication Instructions:  Your physician recommends that you continue on your current medications as directed. Please refer to the Current Medication list given to you today.  *If you need a refill on your cardiac medications before your next appointment, please call your pharmacy*  Lab Work: Fasting lipids- NMR, LPa  If you have labs (blood work) drawn today and your tests are completely normal, you will receive your results only by: MyChart Message (if you have MyChart) OR A paper copy in the mail If you have any lab test that is abnormal or we need to change your treatment, we will call you to review the results.  Testing/Procedures: Wayne Melton has ordered a CT coronary calcium  score.   Test locations:  [facility]  [facility] at [facility]  This is $99 out of pocket.   Coronary CalciumScan A coronary calcium  scan is an imaging test used to look for deposits of calcium  and other fatty materials (plaques) in the inner lining of the blood vessels of the heart (coronary arteries). These deposits of calcium  and plaques can partly clog and narrow the coronary arteries without producing any symptoms or warning signs. This puts a person at risk for a heart attack. This test can detect these deposits before symptoms develop. Tell a health care provider about: Any allergies you have. All medicines you are taking, including vitamins, herbs, eye drops, creams, and over-the-counter medicines. Any problems you or family members have had with anesthetic medicines. Any blood disorders you have. Any surgeries you have had. Any medical conditions you have. Whether you are pregnant or may be pregnant. What are the risks? Generally, this is a safe procedure. However, problems may occur, including: Harm to a pregnant woman and her unborn baby. This test involves the use of radiation. Radiation exposure can be dangerous to  a pregnant woman and her unborn baby. If you are pregnant, you generally should not have this procedure done. Slight increase in the risk of cancer. This is because of the radiation involved in the test. What happens before the procedure? No preparation is needed for this procedure. What happens during the procedure? You will undress and remove any jewelry around your neck or chest. You will put on a hospital gown. Sticky electrodes will be placed on your chest. The electrodes will be connected to an electrocardiogram (ECG) machine to record a tracing of the electrical activity of your heart. A CT scanner will take pictures of your heart. During this time, you will be asked to lie still and hold your breath for 2-3 seconds while a picture of your heart is being taken. The procedure may vary among health care providers and hospitals. What happens after the procedure? You can get dressed. You can return to your normal activities. It is up to you to get the results of your test. Ask your health care provider, or the department that is doing the test, when your results will be ready. Summary A coronary calcium  scan is an imaging test used to look for deposits of calcium  and other fatty materials (plaques) in the inner lining of the blood vessels of the heart (coronary arteries). Generally, this is a safe procedure. Tell your health care provider if you are pregnant or may be pregnant. No preparation is needed for this procedure. A CT scanner will take pictures of your heart. You can return to your normal activities after the scan is done. This information is  not intended to replace advice given to you by your health care provider. Make sure you discuss any questions you have with your health care provider. Document Released: 03/18/2008 Document Revised: 08/09/2016 Document Reviewed: 08/09/2016 Elsevier Interactive Patient Education  2017 [employer].   Follow-Up: At [facility], you  and your health needs are our priority.  As part of our continuing mission to provide you with exceptional heart care, our providers are all part of one team.  This team includes your primary Cardiologist (physician) and Advanced Practice Providers or APPs (Physician Assistants and Nurse Practitioners) who all work together to provide you with the care you need, when you need it.  Your next appointment:   Wayne Melton or Wayne Melton in lipid clinic pending lab work and coronary calcium  score test

## 2024-02-07 NOTE — Progress Notes (Signed)
 LIPID CLINIC CONSULT NOTE  Chief Complaint:  Manage dyslipidemia  Primary Care Physician: Ezell Hollow, MD  Primary Cardiologist:  None  HPI:  Wayne Melton is a 53 y.o. male who is being seen today for the evaluation of dyslipidemia at the request of Ezell Hollow, MD. this a pleasant 53 year old male kindly referred by Dr. POTS for evaluation management of dyslipidemia.  He has a history of high cholesterol in the past and has been on atorvastatin  and fenofibrate .  He said he noted that he had high cholesterol back when he was in the [place] but reported being in better shape at that time.  His total cholesterol recently was 205 triglycerides 291, HDL 33 and LDL 125.  This actually is fairly stable then previously when he was on atorvastatin  40 mg daily.  He had recent increase in that but notes no significant improvement.  Other medical problems include type 2 diabetes, obstructive sleep apnea on CPAP and obesity.  PMHx:  Past Medical History:  Diagnosis Date   Allergic rhinitis 07/05/2012   Constipation    chronic   Diabetes mellitus without complication (HCC)    No meds   Fatty liver disease, nonalcoholic    Gastroparesis    GERD , HH, h/o PUD 07/05/2012   Hyperlipidemia    IBS (irritable bowel syndrome) 11/06/2012   Obesity    OSA (obstructive sleep apnea)    mild   Seasonal allergies    Seasonal allergies    Sleep apnea     Past Surgical History:  Procedure Laterality Date   COLONOSCOPY     UPPER GASTROINTESTINAL ENDOSCOPY      FAMHx:  Family History  Problem Relation Age of Onset   Pancreatitis Mother    Diabetes Mother    Hyperlipidemia Mother    Colon polyps Mother        late 35s   Mitral valve prolapse Mother    Colon cancer Maternal Uncle        uncle in his 69s   Epilepsy Father    Crohn's disease Brother    Colon cancer Maternal Grandfather    CAD Other        GF in his 76s   Irritable bowel syndrome Other    Colitis Other    Heart disease  Other    Prostate cancer Neg Hx     SOCHx:   reports that he has quit smoking. His smoking use included cigarettes. He started smoking about 18 years ago. He has a 9.2 pack-year smoking history. He has never used smokeless tobacco. He reports current alcohol use. He reports that he does not use drugs.  ALLERGIES:  No Known Allergies  ROS: Pertinent items noted in HPI and remainder of comprehensive ROS otherwise negative.  HOME MEDS: Current Outpatient Medications on File Prior to Visit  Medication Sig Dispense Refill   acetaminophen  (TYLENOL ) 500 MG tablet Take 1,000 mg by mouth every 6 (six) hours as needed for moderate pain.     atorvastatin  (LIPITOR) 80 MG tablet Take 1 tablet (80 mg total) by mouth at bedtime. 90 tablet 1   azelastine  (ASTELIN ) 0.1 % nasal spray Place 2 sprays into both nostrils 2 (two) times daily.     empagliflozin  (JARDIANCE ) 25 MG TABS tablet Take 1 tablet (25 mg total) by mouth daily before breakfast. 90 tablet 1   eszopiclone  (LUNESTA ) 1 MG TABS tablet Take 1 tablet (1 mg total) by mouth at bedtime as needed  for sleep. Take immediately before bedtime 30 tablet 5   fenofibrate  (TRICOR ) 145 MG tablet Take 1 tablet (145 mg total) by mouth daily. 90 tablet 1   fluticasone  (FLONASE ) 50 MCG/ACT nasal spray Place into the nose.     fluticasone -salmeterol (WIXELA INHUB) 250-50 MCG/ACT AEPB Inhale 1 puff into the lungs in the morning and at bedtime. 60 each 5   metFORMIN  (GLUCOPHAGE -XR) 750 MG 24 hr tablet Take 2 tablets (1,500 mg total) by mouth daily with breakfast. 180 tablet 1   metoCLOPramide  (REGLAN ) 5 MG tablet Take 1 tablet (5 mg total) by mouth in the morning and at bedtime. TAKE 1 TABLET BY MOUTH 2 TO 3 TIMES A DAY AS NEEDED. Please keep your May 15 appointment for further refills. 60 tablet 1   omeprazole  (PRILOSEC) 40 MG capsule Take 1 capsule (40 mg total) by mouth 2 (two) times daily. Please keep your May appointment for further refills. Thank you 180 capsule  0   polyethylene glycol (MIRALAX ) 17 g packet Take 17 g by mouth daily. 14 each 0   Semaglutide  (RYBELSUS ) 7 MG TABS Take 1 tablet (7 mg total) by mouth daily. 90 tablet 1   No current facility-administered medications on file prior to visit.    LABS/IMAGING: No results found for this or any previous visit (from the past 48 hours). No results found.  LIPID PANEL:    Component Value Date/Time   CHOL 204 (H) 08/19/2023 1417   TRIG 309 (H) 08/19/2023 1417   HDL 33 (L) 08/19/2023 1417   CHOLHDL 6.2 (H) 08/19/2023 1417   VLDL 58.2 (H) 04/18/2023 1403   LDLCALC 127 (H) 08/19/2023 1417   LDLDIRECT 125.0 04/18/2023 1403    WEIGHTS: Wt Readings from Last 3 Encounters:  02/07/24 239 lb (108.4 kg)  01/23/24 243 lb 6.4 oz (110.4 kg)  01/17/24 245 lb (111.1 kg)    VITALS: BP 100/70   Pulse 84   Ht 6' (1.829 m)   Wt 239 lb (108.4 kg)   SpO2 97%   BMI 32.41 kg/m   EXAM: Deferred  EKG: Deferred  ASSESSMENT: Mixed dyslipidemia, goal LDL less than 70 Type 2 diabetes-A1c 6.5% OSA on CPAP Morbid obesity  PLAN: 1.   Mr. Bernheim has a mixed dyslipidemia and remains above a target LDL less than 70.  He has type 2 diabetes with A1c at 6.5%.  I like to further risk ratified with a calcium  score.  His target LDL likely should be below 70.  I suspect he will need additional therapy however his labs were about 36 months old so we will repeat them including an NMR and LP(a).  This may determine what the next best therapeutic option is for him.  Follow-up afterwards.  Thanks again for the kind referral.  Hazle Lites, MD, [facility], FNLA, FACP  [place]  [facility]  Medical Director of the [facility] Lipid [facility] &  Cardiovascular Risk Reduction Clinic Diplomate of the American Board of Clinical Lipidology Attending Cardiologist  Direct Dial: [phone]  Fax: [phone]  Website:  [email or web address] 02/07/2024, 4:56 PM

## 2024-02-10 DIAGNOSIS — E785 Hyperlipidemia, unspecified: Secondary | ICD-10-CM | POA: Diagnosis not present

## 2024-02-12 LAB — NMR, LIPOPROFILE
Cholesterol, Total: 192 mg/dL (ref 100–199)
HDL Particle Number: 33.5 umol/L (ref >=30.5)
HDL-C: 34 mg/dL — ABNORMAL LOW (ref >39)
LDL Particle Number: 1629 nmol/L — ABNORMAL HIGH (ref <1000)
LDL Size: 20.3 nm — ABNORMAL LOW (ref >20.5)
LDL-C (NIH Calc): 112 mg/dL — ABNORMAL HIGH (ref 0–99)
LP-IR Score: 82 — ABNORMAL HIGH (ref <=45)
Small LDL Particle Number: 982 nmol/L — ABNORMAL HIGH (ref <=527)
Triglycerides: 266 mg/dL — ABNORMAL HIGH (ref 0–149)

## 2024-02-12 LAB — LIPOPROTEIN A (LPA): Lipoprotein (a): 18.1 nmol/L (ref <75.0)

## 2024-02-14 ENCOUNTER — Ambulatory Visit: Payer: Self-pay | Admitting: *Deleted

## 2024-02-14 DIAGNOSIS — E782 Mixed hyperlipidemia: Secondary | ICD-10-CM

## 2024-02-14 DIAGNOSIS — G4733 Obstructive sleep apnea (adult) (pediatric): Secondary | ICD-10-CM | POA: Diagnosis not present

## 2024-02-15 MED ORDER — EZETIMIBE 10 MG PO TABS: 10.0000 mg | ORAL_TABLET | Freq: Every day | ORAL | 3 refills | Status: DC

## 2024-02-16 ENCOUNTER — Encounter: Payer: Self-pay | Admitting: Nurse Practitioner

## 2024-02-16 ENCOUNTER — Ambulatory Visit: Admitting: Nurse Practitioner

## 2024-02-16 VITALS — BP 102/62 | HR 70 | Ht 72.0 in | Wt 237.0 lb

## 2024-02-16 DIAGNOSIS — R1310 Dysphagia, unspecified: Secondary | ICD-10-CM | POA: Diagnosis not present

## 2024-02-16 DIAGNOSIS — K219 Gastro-esophageal reflux disease without esophagitis: Secondary | ICD-10-CM

## 2024-02-16 DIAGNOSIS — K760 Fatty (change of) liver, not elsewhere classified: Secondary | ICD-10-CM | POA: Diagnosis not present

## 2024-02-16 DIAGNOSIS — Z860101 Personal history of adenomatous and serrated colon polyps: Secondary | ICD-10-CM

## 2024-02-16 DIAGNOSIS — K3184 Gastroparesis: Secondary | ICD-10-CM | POA: Diagnosis not present

## 2024-02-16 NOTE — Progress Notes (Signed)
 Agree with assessment / plan as outlined.

## 2024-02-16 NOTE — Progress Notes (Signed)
 02/16/2024 VALE PALEY [id number] [birth year]   Chief Complaint: Gastroparesis, GERD and dysphagia follow-up  History of Present Illness: Wayne Melton is a 53 year old male with a past medial history of hyperlipidemia, diabetes mellitus type 2, sleep apnea, GERD, gastroparesis, fatty liver and colon polyps. He was last seen in office by Dr. General Kenner 12/17/2022 for gastroparesis and GERD follow-up and he noted having occasional dysphagia x 7 to 8 months.  On low-dose Reglan  and Omeprazole  40 mg twice daily. An EGD and barium swallow were discussed with the patient, however, he did not wish to pursue any further evaluation at that time. He presents today for routine follow-up regarding gastroparesis, GERD and dysphagia. He denies having any nausea or vomiting. He takes Reglan  once daily before dinner. He denies having any heartburn. He continues to have periodic dysphagia. He describes food which gets stuck to the throat/upper esophagus which passes after he drinks water. He questions if environmental/seasonal allergies contribute to his swallowing issues. He underwent an EGD 08/16/2017 which showed a normal esophagus, a few small linear base ulcers in the stomach and multiple benign small gastric polyps.  His bowel movements are normal, no bloody or black stools.  His most recent colonoscopy was 07/2021 which identified 1 small adenoma.  Mother with history of colon polyps at the age of 52 and he has 2 second-degree relatives with colon cancer and brother with history of Crohn's disease.     Latest Ref Rng & Units 08/19/2023    2:17 PM 08/13/2022    1:16 PM 04/14/2021    2:09 AM  CBC  WBC 3.8 - 10.8 Thousand/uL 9.5  8.1  8.8   Hemoglobin 13.2 - 17.1 g/dL 40.9  81.1  91.4   Hematocrit 38.5 - 50.0 % 43.3  42.8  44.5   Platelets 140 - 400 Thousand/uL 393  348.0  313        Latest Ref Rng & Units 12/07/2023   11:36 AM 08/19/2023    2:17 PM 04/18/2023    2:03 PM  CMP  Glucose 70 -  99 mg/dL 97  85  84   BUN 6 - 23 mg/dL 12  15  13    Creatinine 0.40 - 1.50 mg/dL 7.82  9.56  2.13   Sodium 135 - 145 mEq/L 139  140  138   Potassium 3.5 - 5.1 mEq/L 3.9  4.2  3.9   Chloride 96 - 112 mEq/L 102  103  103   CO2 19 - 32 mEq/L 30  24  27    Calcium  8.4 - 10.5 mg/dL 9.8  9.9  08.6   Total Protein 6.0 - 8.3 g/dL   7.1   Total Bilirubin 0.2 - 1.2 mg/dL   0.6   Alkaline Phos 39 - 117 U/L   47   AST 10 - 35 U/L  14  15   ALT 9 - 46 U/L  40  41     Prior workup: EGD 08/16/2017 - few small linear based ulcers, normal esophagus, multiple small gastric polyps, - benign biopsies Colonoscopy 12/09/15 - 4mm transverse TA, otherwise normal exam     US  abdomen 10/23/18 - diffuse hepatic steatosis, Two hypoechoic liver masses which were not definitely seen on previous study, but are suspicious for areas of benign focal fatty sparing.   CT abdomen / pelvis - 11/04/18 - IMPRESSION: 1.  No acute intra-abdominal process. 2. Hepatic steatosis.   GES 08/09/19 -  Delayed gastric emptying  study.  Findings suggest gastroparesis.   CT scan abdomen / pelvis with contrast 04/14/21: IMPRESSION: 1. No acute abdominopelvic process to provide cause for patient's symptoms. 2. Hepatic steatosis.   Colonoscopy 07/06/21: - The perianal and digital rectal examinations were normal.  - The terminal ileum appeared normal.  - A 3 mm polyp was found in the transverse colon. The polyp was sessile. The polyp was removed with a cold snare. Resection and retrieval were complete.  - Internal hemorrhoids were found during retroflexion.  - The exam was otherwise without abnormality.   Surgical [P], colon, transverse, polyp (1) - TUBULAR ADENOMA - NEGATIVE FOR HIGH-GRADE DYSPLASIA OR MALIGNANCY     Past Medical History:  Diagnosis Date   Allergic rhinitis 07/05/2012   Constipation    chronic   Diabetes mellitus without complication (HCC)    No meds   Fatty liver disease, nonalcoholic    Gastroparesis    GERD  , HH, h/o PUD 07/05/2012   Hyperlipidemia    IBS (irritable bowel syndrome) 11/06/2012   Obesity    OSA (obstructive sleep apnea)    mild   Seasonal allergies    Seasonal allergies    Sleep apnea    Past Surgical History:  Procedure Laterality Date   COLONOSCOPY     UPPER GASTROINTESTINAL ENDOSCOPY      Current Outpatient Medications on File Prior to Visit  Medication Sig Dispense Refill   acetaminophen  (TYLENOL ) 500 MG tablet Take 1,000 mg by mouth every 6 (six) hours as needed for moderate pain.     atorvastatin  (LIPITOR) 80 MG tablet Take 1 tablet (80 mg total) by mouth at bedtime. 90 tablet 1   azelastine  (ASTELIN ) 0.1 % nasal spray Place 2 sprays into both nostrils 2 (two) times daily.     empagliflozin  (JARDIANCE ) 25 MG TABS tablet Take 1 tablet (25 mg total) by mouth daily before breakfast. 90 tablet 1   eszopiclone  (LUNESTA ) 1 MG TABS tablet Take 1 tablet (1 mg total) by mouth at bedtime as needed for sleep. Take immediately before bedtime 30 tablet 5   ezetimibe (ZETIA) 10 MG tablet Take 1 tablet (10 mg total) by mouth daily. 90 tablet 3   fenofibrate  (TRICOR ) 145 MG tablet Take 1 tablet (145 mg total) by mouth daily. 90 tablet 1   fluticasone  (FLONASE ) 50 MCG/ACT nasal spray Place into the nose.     fluticasone -salmeterol (WIXELA INHUB) 250-50 MCG/ACT AEPB Inhale 1 puff into the lungs in the morning and at bedtime. 60 each 5   metFORMIN  (GLUCOPHAGE -XR) 750 MG 24 hr tablet Take 2 tablets (1,500 mg total) by mouth daily with breakfast. 180 tablet 1   metoCLOPramide  (REGLAN ) 5 MG tablet Take 1 tablet (5 mg total) by mouth in the morning and at bedtime. TAKE 1 TABLET BY MOUTH 2 TO 3 TIMES A DAY AS NEEDED. Please keep your May 15 appointment for further refills. 60 tablet 1   omeprazole  (PRILOSEC) 40 MG capsule Take 1 capsule (40 mg total) by mouth 2 (two) times daily. Please keep your May appointment for further refills. Thank you 180 capsule 0   polyethylene glycol (MIRALAX ) 17 g  packet Take 17 g by mouth daily. 14 each 0   Semaglutide  (RYBELSUS ) 7 MG TABS Take 1 tablet (7 mg total) by mouth daily. 90 tablet 1   No current facility-administered medications on file prior to visit.   No Known Allergies   Current Medications, Allergies, Past Medical History, Past Surgical History, Family History  and Social History were reviewed in [employer] record.  Review of Systems:   Constitutional: Negative for fever, sweats, chills or weight loss.  Respiratory: Negative for shortness of breath.   Cardiovascular: Negative for chest pain, palpitations and leg swelling.  Gastrointestinal: See HPI.  Musculoskeletal: Negative for back pain or muscle aches.  Neurological: Negative for dizziness, headaches or paresthesias.   Physical Exam: BP 102/62   Pulse 70   Ht 6' (1.829 m)   Wt 237 lb (107.5 kg)   BMI 32.14 kg/m   General: 53 year old male in no acute distress. Head: Normocephalic and atraumatic. Eyes: No scleral icterus. Conjunctiva pink . Ears: Normal auditory acuity. Mouth: Dentition intact. No ulcers or lesions.  Lungs: Clear throughout to auscultation. Heart: Regular rate and rhythm, no murmur. Abdomen: Soft, nontender and nondistended. No masses or hepatomegaly. Normal bowel sounds x 4 quadrants.  Rectal: Deferred.  Musculoskeletal: Symmetrical with no gross deformities. Extremities: No edema. Neurological: Alert oriented x 4. No focal deficits.  Psychological: Alert and cooperative. Normal mood and affect  Assessment and Recommendations:  53 year old male with GERD and chronic intermittent dysphagia -EGD with possible esophageal dilatation benefits and risks discussed including risk with sedation, risk of bleeding, perforation and infection  -Continue Omeprazole  40 mg daily -GERD diet  Gastroparesis - Continue Reglan  5 mg p.o. prior to dinner.  I discussed the potential risk of tardive dyskinesia, patient wishes to continue Reglan   as previously prescribed as this medication has been well-tolerated with symptom control.  History of colon polyps. Mother with history of colon polyps in her 30s.  His most recent colonoscopy 07/2021 identified a 3 mm tubular adenomatous polyp removed from transverse colon. - Next colon polyp surveillance colonoscopy due 07/2026  Hepatic steatosis.  Normal LFTs. - Avoid weight gain - Check annual LFTs with PCP

## 2024-02-16 NOTE — Patient Instructions (Addendum)
 Continue Omeprazole  40 mg twice daily and reglan  5 mg daily before dinner.   You have been scheduled for an endoscopy. Please follow written instructions given to you at your visit today.  If you use inhalers (even only as needed), please bring them with you on the day of your procedure.  If you take any of the following medications, they will need to be adjusted prior to your procedure:   DO NOT TAKE 7 DAYS PRIOR TO TEST- Trulicity (dulaglutide) Ozempic, Wegovy (semaglutide ) Mounjaro (tirzepatide) Bydureon Bcise (exanatide extended release)  DO NOT TAKE 1 DAY PRIOR TO YOUR TEST Rybelsus  (semaglutide ) Adlyxin (lixisenatide) Victoza (liraglutide) Byetta (exanatide) ___________________________________________________________________________   _______________________________________________________  If your blood pressure at your visit was 140/90 or greater, please contact your primary care physician to follow up on this.  _______________________________________________________  If you are age 68 or older, your body mass index should be between 23-30. Your Body mass index is 32.14 kg/m. If this is out of the aforementioned range listed, please consider follow up with your Primary Care Provider.  If you are age 79 or younger, your body mass index should be between 19-25. Your Body mass index is 32.14 kg/m. If this is out of the aformentioned range listed, please consider follow up with your Primary Care Provider.   ________________________________________________________  The  GI providers would like to encourage you to use MYCHART to communicate with providers for non-urgent requests or questions.  Due to long hold times on the telephone, sending your provider a message by [facility] may be a faster and more efficient way to get a response.  Please allow 48 business hours for a response.  Please remember that this is for non-urgent requests.   _______________________________________________________   It was a pleasure to see you today!  Thank you for trusting me with your gastrointestinal care!

## 2024-02-17 ENCOUNTER — Ambulatory Visit: Payer: 59 | Admitting: Internal Medicine

## 2024-02-17 ENCOUNTER — Other Ambulatory Visit (HOSPITAL_COMMUNITY): Payer: Self-pay

## 2024-02-17 ENCOUNTER — Telehealth: Payer: Self-pay

## 2024-02-17 NOTE — Telephone Encounter (Signed)
 Pharmacy Patient Advocate Encounter  Received notification from [employer] that Prior Authorization for Wixela Inhub 250-50MCG/ACT aerosol powder has been DENIED.  See denial reason below. No denial letter attached in CMM. Will attach denial letter to Media tab once received.   PA #/Case ID/Reference #: [id number]

## 2024-02-17 NOTE — Telephone Encounter (Signed)
 Pharmacy Patient Advocate Encounter   Received notification from CoverMyMeds that prior authorization for Wixela Inhub 250-50MCG/ACT aerosol powder is required/requested.   Insurance verification completed.   The patient is insured through [employer] .   Per test claim: PA required; PA submitted to above mentioned insurance via CoverMyMeds Key/confirmation #/EOC [id number] Status is pending

## 2024-02-21 ENCOUNTER — Ambulatory Visit (HOSPITAL_BASED_OUTPATIENT_CLINIC_OR_DEPARTMENT_OTHER)
Admission: RE | Admit: 2024-02-21 | Discharge: 2024-02-21 | Disposition: A | Discharge status: Home / Self Care | Payer: Self-pay | Source: Ambulatory Visit | Attending: Internal Medicine | Admitting: Internal Medicine

## 2024-02-21 DIAGNOSIS — E782 Mixed hyperlipidemia: Secondary | ICD-10-CM | POA: Insufficient documentation

## 2024-03-09 ENCOUNTER — Ambulatory Visit: Admitting: Internal Medicine

## 2024-03-09 DIAGNOSIS — G4733 Obstructive sleep apnea (adult) (pediatric): Secondary | ICD-10-CM | POA: Diagnosis not present

## 2024-03-12 ENCOUNTER — Ambulatory Visit: Admitting: Internal Medicine

## 2024-03-12 VITALS — BP 118/64 | HR 78 | Temp 98.0°F | Resp 16 | Ht 72.0 in | Wt 240.2 lb

## 2024-03-12 DIAGNOSIS — Z23 Encounter for immunization: Secondary | ICD-10-CM

## 2024-03-12 DIAGNOSIS — J9801 Acute bronchospasm: Secondary | ICD-10-CM | POA: Diagnosis not present

## 2024-03-12 DIAGNOSIS — E119 Type 2 diabetes mellitus without complications: Secondary | ICD-10-CM

## 2024-03-12 DIAGNOSIS — Z7984 Long term (current) use of oral hypoglycemic drugs: Secondary | ICD-10-CM

## 2024-03-12 MED ORDER — BUDESONIDE-FORMOTEROL FUMARATE 160-4.5 MCG/ACT IN AERO: 2.0000 | INHALATION_SPRAY | Freq: Two times a day (BID) | RESPIRATORY_TRACT | 3 refills | Status: AC

## 2024-03-12 NOTE — Progress Notes (Unsigned)
 Subjective:    Patient ID: EDDRICK DILONE, male    DOB: [birth year], 53 y.o.   MRN: [id number]  DOS:  03/12/2024 Type of visit - description: Follow-up.  Doing well.  No major concerns.  Saw cardiology and GI, notes reviewed  Wt Readings from Last 3 Encounters:  03/12/24 240 lb 4 oz (109 kg)  02/16/24 237 lb (107.5 kg)  02/07/24 239 lb (108.4 kg)   Review of Systems See above   Past Medical History:  Diagnosis Date   Allergic rhinitis 07/05/2012   Constipation    chronic   Diabetes mellitus without complication (HCC)    No meds   Fatty liver disease, nonalcoholic    Gastroparesis    GERD , HH, h/o PUD 07/05/2012   Hyperlipidemia    IBS (irritable bowel syndrome) 11/06/2012   Obesity    OSA (obstructive sleep apnea)    mild   Seasonal allergies    Sleep apnea     Past Surgical History:  Procedure Laterality Date   COLONOSCOPY     UPPER GASTROINTESTINAL ENDOSCOPY      Current Outpatient Medications  Medication Instructions   acetaminophen  (TYLENOL ) 1,000 mg, Every 6 hours PRN   atorvastatin  (LIPITOR) 80 mg, Oral, Daily at bedtime   azelastine  (ASTELIN ) 0.1 % nasal spray 2 sprays, Each Nare, 2 times daily   budesonide -formoterol  (SYMBICORT ) 160-4.5 MCG/ACT inhaler 2 puffs, Inhalation, 2 times daily   empagliflozin  (JARDIANCE ) 25 mg, Oral, Daily before breakfast   eszopiclone  (LUNESTA ) 1 mg, Oral, At bedtime PRN, Take immediately before bedtime   ezetimibe  (ZETIA ) 10 mg, Oral, Daily   fenofibrate  (TRICOR ) 145 mg, Oral, Daily   fluticasone  (FLONASE ) 50 MCG/ACT nasal spray Place into the nose.   metFORMIN  (GLUCOPHAGE -XR) 1,500 mg, Oral, Daily with breakfast   metoCLOPramide  (REGLAN ) 5 mg, Oral, 2 times daily, TAKE 1 TABLET BY MOUTH 2 TO 3 TIMES A DAY AS NEEDED. Please keep your May 15 appointment for further refills.   omeprazole  (PRILOSEC) 40 mg, Oral, 2 times daily, Please keep your May appointment for further refills. Thank you   polyethylene glycol (MIRALAX ) 17  g, Oral, Daily   Rybelsus  7 mg, Oral, Daily       Objective:   Physical Exam BP 118/64   Pulse 78   Temp 98 F (36.7 C) (Oral)   Resp 16   Ht 6' (1.829 m)   Wt 240 lb 4 oz (109 kg)   SpO2 97%   BMI 32.58 kg/m  General:   Well developed, NAD, BMI noted. HEENT:  Normocephalic . Face symmetric, atraumatic Lungs:  CTA B Normal respiratory effort, no intercostal retractions, no accessory muscle use. Heart: RRR,  no murmur.  DM foot exam: No edema, good pedal pulses, pinprick examination normal Skin: Not pale. Not jaundice Neurologic:  alert & oriented X3.  Speech normal, gait appropriate for age and unassisted Psych--  Cognition and judgment appear intact.  Cooperative with normal attention span and concentration.  Behavior appropriate. No anxious or depressed appearing.      Assessment     Assessment DM Hyperlipidemia Coronary calcium  score 0 (02/21/2024 Bronchospasm (on Qvar ) GI: --h/o PUD (EGD @ [facility]  2002: neg  but reports had another EGD with ulcer) --GERD, IBS, occ constipation, gastroparesis (+ gastric emptying study October 2020) -- fatty liver per u/s 06-2015 -EGD 08-2017: Gastritis, polyps, benign findings per GI letter, no follow-up OSA:  HST 08-2018: Rx CPAP - DOE x years>>>>  neg stress test 03/2015  Abnormal brain MRI 05-2020 (subsequently saw neurosurgery, recommend observation, per patient, no documentation)  PLAN DM: On Jardiance , metformin , Rybelsus  7 mg.  Last A1c 6.5, encouraged to continue with portion control, stay active.  Check A1c.  Further eval for results. Feet exam negative. Dyslipidemia: Per cardiology.  On atorvastatin , fibrate's.  Had a NMR recently, Zetia  added. GERD, gastroparesis, chronic intermittent dysphagia: Saw GI 02/16/2024, Rx EGD.  Continue Reglan .  Next colonoscopy 2027. Bronchospasm: Refill Symbicort . Preventive care: PNM 20 today RTC 08-2024 CPX

## 2024-03-12 NOTE — Patient Instructions (Addendum)
 Continue watching your portions closely.  Try to exercise 3 hours a week.  You got a pneumonia shot today (PNM 20)    GO TO THE LAB :  Get the blood work   Your results will be posted on MyChart with my comments  Next office visit for a physical exam by 08-2024. Come back sooner if needed please make an appointment before you leave today

## 2024-03-13 LAB — HEMOGLOBIN A1C
Hgb A1c MFr Bld: 6.3 % — ABNORMAL HIGH (ref <5.7)
Mean Plasma Glucose: 134 mg/dL
eAG (mmol/L): 7.4 mmol/L

## 2024-03-13 NOTE — Assessment & Plan Note (Signed)
 DM: On Jardiance , metformin , Rybelsus  7 mg.  Last A1c 6.5, encouraged to continue with portion control, stay active.  Check A1c.  Further eval for results. Feet exam negative. Dyslipidemia: Per cardiology.  On atorvastatin , fibrate's.  Had a NMR recently, Zetia  added. GERD, gastroparesis, chronic intermittent dysphagia: Saw GI 02/16/2024, Rx EGD.  Continue Reglan .  Next colonoscopy 2027. Bronchospasm: Refill Symbicort . Preventive care: PNM 20 today RTC 08-2024 CPX

## 2024-03-16 ENCOUNTER — Ambulatory Visit: Payer: Self-pay | Admitting: Internal Medicine

## 2024-03-16 DIAGNOSIS — G4733 Obstructive sleep apnea (adult) (pediatric): Secondary | ICD-10-CM | POA: Diagnosis not present

## 2024-03-18 ENCOUNTER — Other Ambulatory Visit: Payer: Self-pay | Admitting: Gastroenterology

## 2024-03-19 ENCOUNTER — Encounter: Payer: Self-pay | Admitting: Internal Medicine

## 2024-03-19 ENCOUNTER — Other Ambulatory Visit (HOSPITAL_COMMUNITY): Payer: Self-pay

## 2024-03-19 ENCOUNTER — Telehealth: Payer: Self-pay

## 2024-03-19 NOTE — Telephone Encounter (Signed)
 Pharmacy Patient Advocate Encounter   Received notification from Patient Advice Request messages that prior authorization for Symbicort  160-4.9mcg is required/requested.   Insurance verification completed.   The patient is insured through [employer] .   Per test claim: PA required; PA submitted to above mentioned insurance via CoverMyMeds Key/confirmation #/EOC [id number] Status is pending

## 2024-03-19 NOTE — Telephone Encounter (Signed)
 Pharmacy Patient Advocate Encounter  Received notification from [employer] that Prior Authorization for Symbicort  160-4.21mcg has been APPROVED from 02/18/24 to 03/19/25   PA #/Case ID/Reference #: [id number]

## 2024-03-20 ENCOUNTER — Other Ambulatory Visit: Payer: Self-pay

## 2024-03-20 MED ORDER — EZETIMIBE 10 MG PO TABS: 10.0000 mg | ORAL_TABLET | Freq: Every day | ORAL | 3 refills | Status: AC

## 2024-04-01 ENCOUNTER — Other Ambulatory Visit: Payer: Self-pay | Admitting: Gastroenterology

## 2024-04-03 ENCOUNTER — Encounter: Payer: Self-pay | Admitting: Gastroenterology

## 2024-04-04 ENCOUNTER — Encounter: Payer: Self-pay | Admitting: Internal Medicine

## 2024-04-05 ENCOUNTER — Other Ambulatory Visit (HOSPITAL_COMMUNITY): Payer: Self-pay

## 2024-04-05 ENCOUNTER — Telehealth: Payer: Self-pay

## 2024-04-05 NOTE — Telephone Encounter (Signed)
 Pharmacy Patient Advocate Encounter  Received notification from [employer] that Prior Authorization for Rybelsus  7mg  tabs has been APPROVED from 04/05/24 to 04/05/25   PA #/Case ID/Reference #: [id number]

## 2024-04-05 NOTE — Telephone Encounter (Signed)
 Pharmacy Patient Advocate Encounter   Received notification from Patient Advice Request messages that prior authorization for Rybelsus  7mg  tabs is required/requested.   Insurance verification completed.   The patient is insured through [employer] .   Per test claim: PA required; PA submitted to above mentioned insurance via CoverMyMeds Key/confirmation #/EOC [id number] Status is pending

## 2024-04-08 DIAGNOSIS — G4733 Obstructive sleep apnea (adult) (pediatric): Secondary | ICD-10-CM | POA: Diagnosis not present

## 2024-04-12 ENCOUNTER — Ambulatory Visit: Admitting: Gastroenterology

## 2024-04-12 ENCOUNTER — Encounter: Payer: Self-pay | Admitting: Gastroenterology

## 2024-04-12 VITALS — BP 113/77 | HR 76 | Temp 97.9°F | Resp 15 | Ht 72.0 in | Wt 240.0 lb

## 2024-04-12 DIAGNOSIS — K449 Diaphragmatic hernia without obstruction or gangrene: Secondary | ICD-10-CM

## 2024-04-12 DIAGNOSIS — K317 Polyp of stomach and duodenum: Secondary | ICD-10-CM | POA: Diagnosis not present

## 2024-04-12 DIAGNOSIS — R1310 Dysphagia, unspecified: Secondary | ICD-10-CM

## 2024-04-12 DIAGNOSIS — K219 Gastro-esophageal reflux disease without esophagitis: Secondary | ICD-10-CM

## 2024-04-12 MED ORDER — SODIUM CHLORIDE 0.9 % IV SOLN: 500.0000 mL | Freq: Once | INTRAVENOUS | Status: DC

## 2024-04-12 NOTE — Progress Notes (Signed)
 Sedate, gd SR, tolerated procedure well, VSS, report to RN

## 2024-04-12 NOTE — Patient Instructions (Addendum)
 Resume previous diet and continue present medications  Await course post dilation. If symptoms persist, would further evaluate for dysmotility.   YOU HAD AN ENDOSCOPIC PROCEDURE TODAY AT THE [facility]:   Refer to the procedure report that was given to you for any specific questions about what was found during the examination.  If the procedure report does not answer your questions, please call your gastroenterologist to clarify.  If you requested that your care partner not be given the details of your procedure findings, then the procedure report has been included in a sealed envelope for you to review at your convenience later.  YOU SHOULD EXPECT: Some feelings of bloating in the abdomen. Passage of more gas than usual.  Walking can help get rid of the air that was put into your GI tract during the procedure and reduce the bloating. If you had a lower endoscopy (such as a colonoscopy or flexible sigmoidoscopy) you may notice spotting of blood in your stool or on the toilet paper. If you underwent a bowel prep for your procedure, you may not have a normal bowel movement for a few days.  Please Note:  You might notice some irritation and congestion in your nose or some drainage.  This is from the oxygen used during your procedure.  There is no need for concern and it should clear up in a day or so.  SYMPTOMS TO REPORT IMMEDIATELY:  Following upper endoscopy (EGD)  Vomiting of blood or coffee ground material  New chest pain or pain under the shoulder blades  Painful or persistently difficult swallowing  New shortness of breath  Fever of 100F or higher  Black, tarry-looking stools  For urgent or emergent issues, a gastroenterologist can be reached at any hour by calling [phone]. Do not use MyChart messaging for urgent concerns.    DIET:  We do recommend a small meal at first, but then you may proceed to your regular diet.  Drink plenty of fluids but you should avoid  alcoholic beverages for 24 hours.  ACTIVITY:  You should plan to take it easy for the rest of today and you should NOT DRIVE or use heavy machinery until tomorrow (because of the sedation medicines used during the test).    FOLLOW UP: Our staff will call the number listed on your records the next business day following your procedure.  We will call around 7:15- 8:00 am to check on you and address any questions or concerns that you may have regarding the information given to you following your procedure. If we do not reach you, we will leave a message.     If any biopsies were taken you will be contacted by phone or by letter within the next 1-3 weeks.  Please call us  at [phone] if you have not heard about the biopsies in 3 weeks.    SIGNATURES/CONFIDENTIALITY: You and/or your care partner have signed paperwork which will be entered into your electronic medical record.  These signatures attest to the fact that that the information above on your After Visit Summary has been reviewed and is understood.  Full responsibility of the confidentiality of this discharge information lies with you and/or your care-partner.

## 2024-04-12 NOTE — Progress Notes (Signed)
 [facility] History and Physical   Primary Care Physician:  Amon Aloysius BRAVO, MD   Reason for Procedure:   Dysphagia  Plan:    EGD     HPI: Wayne Melton is a 53 y.o. male  here for EGD with dilation. History of GERD, dysphagia. Remote prior EGD. GERD controlled.  Otherwise feels well without any cardiopulmonary symptoms.   I have discussed risks / benefits of anesthesia and endoscopic procedure with [name] and they wish to proceed with the exams as outlined today.    Past Medical History:  Diagnosis Date   Allergic rhinitis 07/05/2012   Allergy    seasonal   Asthma    Constipation    chronic   Diabetes mellitus without complication (HCC)    No meds   Fatty liver disease, nonalcoholic    Gastroparesis    GERD , HH, h/o PUD 07/05/2012   Hyperlipidemia    IBS (irritable bowel syndrome) 11/06/2012   Obesity    OSA (obstructive sleep apnea)    mild   Seasonal allergies    Sleep apnea    uses CPAP    Past Surgical History:  Procedure Laterality Date   COLONOSCOPY     UPPER GASTROINTESTINAL ENDOSCOPY      Prior to Admission medications   Medication Sig Start Date End Date Taking? Authorizing Provider  atorvastatin  (LIPITOR) 80 MG tablet Take 1 tablet (80 mg total) by mouth at bedtime. 01/30/24  Yes Paz, Aloysius BRAVO, MD  azelastine  (ASTELIN ) 0.1 % nasal spray Place 2 sprays into both nostrils 2 (two) times daily. 01/17/24  Yes Paz, Jose E, MD  budesonide -formoterol  (SYMBICORT ) 160-4.5 MCG/ACT inhaler Inhale 2 puffs into the lungs 2 (two) times daily. 03/12/24  Yes Amon Aloysius BRAVO, MD  empagliflozin  (JARDIANCE ) 25 MG TABS tablet Take 1 tablet (25 mg total) by mouth daily before breakfast. 01/09/24  Yes Paz, Jose E, MD  eszopiclone  (LUNESTA ) 1 MG TABS tablet Take 1 tablet (1 mg total) by mouth at bedtime as needed for sleep. Take immediately before bedtime 01/30/24  Yes Cobb, Comer GAILS, NP  ezetimibe  (ZETIA ) 10 MG tablet Take 1 tablet (10 mg total) by mouth  daily. 03/20/24 06/18/24 Yes Hilty, Vinie BROCKS, MD  fenofibrate  (TRICOR ) 145 MG tablet Take 1 tablet (145 mg total) by mouth daily. 11/14/23  Yes Paz, Aloysius BRAVO, MD  fluticasone  (FLONASE ) 50 MCG/ACT nasal spray Place into the nose.   Yes [provider]  metFORMIN  (GLUCOPHAGE -XR) 750 MG 24 hr tablet Take 2 tablets (1,500 mg total) by mouth daily with breakfast. 11/18/23  Yes Paz, Jose E, MD  metoCLOPramide  (REGLAN ) 5 MG tablet TAKE 1 TABLET BY MOUTH PRIOR TO DINNER 03/19/24  Yes Kennedy-Smith, Colleen M, NP  Multiple Vitamin (MULTIVITAMIN) tablet Take 1 tablet by mouth daily.   Yes [provider]  omeprazole  (PRILOSEC) 40 MG capsule Take 1 capsule (40 mg total) by mouth daily. 04/02/24  Yes Kennedy-Smith, Colleen M, NP  Semaglutide  (RYBELSUS ) 7 MG TABS Take 1 tablet (7 mg total) by mouth daily. 01/09/24  Yes Paz, Aloysius BRAVO, MD  acetaminophen  (TYLENOL ) 500 MG tablet Take 1,000 mg by mouth every 6 (six) hours as needed for moderate pain.    [provider]  polyethylene glycol (MIRALAX ) 17 g packet Take 17 g by mouth daily. 07/18/19   Melchor Kirchgessner, Elspeth SQUIBB, MD    Current Outpatient Medications  Medication Sig Dispense Refill   atorvastatin  (LIPITOR) 80 MG tablet Take 1 tablet (80  mg total) by mouth at bedtime. 90 tablet 1   azelastine  (ASTELIN ) 0.1 % nasal spray Place 2 sprays into both nostrils 2 (two) times daily.     budesonide -formoterol  (SYMBICORT ) 160-4.5 MCG/ACT inhaler Inhale 2 puffs into the lungs 2 (two) times daily. 3 each 3   empagliflozin  (JARDIANCE ) 25 MG TABS tablet Take 1 tablet (25 mg total) by mouth daily before breakfast. 90 tablet 1   eszopiclone  (LUNESTA ) 1 MG TABS tablet Take 1 tablet (1 mg total) by mouth at bedtime as needed for sleep. Take immediately before bedtime 30 tablet 5   ezetimibe  (ZETIA ) 10 MG tablet Take 1 tablet (10 mg total) by mouth daily. 90 tablet 3   fenofibrate  (TRICOR ) 145 MG tablet Take 1 tablet (145 mg total) by mouth daily. 90 tablet 1    fluticasone  (FLONASE ) 50 MCG/ACT nasal spray Place into the nose.     metFORMIN  (GLUCOPHAGE -XR) 750 MG 24 hr tablet Take 2 tablets (1,500 mg total) by mouth daily with breakfast. 180 tablet 1   metoCLOPramide  (REGLAN ) 5 MG tablet TAKE 1 TABLET BY MOUTH PRIOR TO DINNER 60 tablet 1   Multiple Vitamin (MULTIVITAMIN) tablet Take 1 tablet by mouth daily.     omeprazole  (PRILOSEC) 40 MG capsule Take 1 capsule (40 mg total) by mouth daily. 90 capsule 1   Semaglutide  (RYBELSUS ) 7 MG TABS Take 1 tablet (7 mg total) by mouth daily. 90 tablet 1   acetaminophen  (TYLENOL ) 500 MG tablet Take 1,000 mg by mouth every 6 (six) hours as needed for moderate pain.     polyethylene glycol (MIRALAX ) 17 g packet Take 17 g by mouth daily. 14 each 0   Current Facility-Administered Medications  Medication Dose Route Frequency Provider Last Rate Last Admin   0.9 %  sodium chloride  infusion  500 mL Intravenous Once Aloysious Vangieson, Elspeth SQUIBB, MD        Allergies as of 04/12/2024   (No Known Allergies)    Family History  Problem Relation Age of Onset   Pancreatitis Mother    Diabetes Mother    Hyperlipidemia Mother    Colon polyps Mother        late 18s   Mitral valve prolapse Mother    Epilepsy Father    Crohn's disease Brother    Colon cancer Maternal Uncle        uncle in his 57s   Colon cancer Maternal Grandfather    CAD Other        GF in his 58s   Irritable bowel syndrome Other    Colitis Other    Heart disease Other    Prostate cancer Neg Hx    Esophageal cancer Neg Hx    Rectal cancer Neg Hx    Stomach cancer Neg Hx     Social History   Socioeconomic History   Marital status: Married    Spouse name: Not on file   Number of children: 3   Years of education: Not on file   Highest education level: Master's degree (e.g., MA, MS, MEng, MEd, MSW, MBA)  Occupational History    Employer: [employer]  Tobacco Use   Smoking status: Former    Current packs/day: 0.50    Average packs/day: 0.5 packs/day for  18.5 years (9.3 ttl pk-yrs)    Types: Cigarettes    Start date: 2007   Smokeless tobacco: Never   Tobacco comments:    On-off quit, currently not smoking 08-2021  Vaping Use   Vaping status: Never  Used  Substance and Sexual Activity   Alcohol use: Yes    Comment: rare   Drug use: Never   Sexual activity: Not on file  Other Topics Concern   Not on file  Social History Narrative   Household: pt, wife, 2 sons    Daughter 2001,    Sons: 2007, 2008   One daughter married , 1 g-son n       Social Drivers of Corporate investment banker Strain: Low Risk  (03/12/2024)   Overall Financial Resource Strain (CARDIA)    Difficulty of Paying Living Expenses: Not very hard  Food Insecurity: No Food Insecurity (03/12/2024)   Hunger Vital Sign    Worried About Running Out of Food in the Last Year: Never true    Ran Out of Food in the Last Year: Never true  Transportation Needs: No Transportation Needs (03/12/2024)   PRAPARE - Administrator, Civil Service (Medical): No    Lack of Transportation (Non-Medical): No  Physical Activity: Insufficiently Active (03/12/2024)   Exercise Vital Sign    Days of Exercise per Week: 1 day    Minutes of Exercise per Session: 20 min  Stress: Stress Concern Present (03/12/2024)   [employer] of Occupational Health - Occupational Stress Questionnaire    Feeling of Stress : To some extent  Social Connections: Socially Integrated (03/12/2024)   Social Connection and Isolation Panel    Frequency of Communication with Friends and Family: More than three times a week    Frequency of Social Gatherings with Friends and Family: Three times a week    Attends Religious Services: More than 4 times per year    Active Member of Clubs or Organizations: Yes    Attends Engineer, structural: More than 4 times per year    Marital Status: Married  Catering manager Violence: Not on file    Review of Systems: All other review of systems negative except as  mentioned in the HPI.  Physical Exam: Vital signs BP 104/63   Pulse 79   Temp 97.9 F (36.6 C) (Temporal)   Resp 13   Ht 6' (1.829 m)   Wt 240 lb (108.9 kg)   SpO2 100%   BMI 32.55 kg/m   General:   Alert,  Well-developed, pleasant and cooperative in NAD Lungs:  Clear throughout to auscultation.   Heart:  Regular rate and rhythm Abdomen:  Soft, nontender and nondistended.   Neuro/Psych:  Alert and cooperative. Normal mood and affect. A and O x 3  Marcey Naval, MD [facility]

## 2024-04-12 NOTE — Op Note (Signed)
 [facility] Patient Name: Wayne Melton Procedure Date: 04/12/2024 12:33 PM MRN: [id number] Endoscopist: Elspeth P. Leigh , MD, [id number] Age: 53 Referring MD:  Date of Birth: 27-May-1971 Gender: Male Account #: [id number] Procedure:                Upper GI endoscopy Indications:              Dysphagia, Follow-up of gastro-esophageal reflux                            disease - reflux is controlled on omeprazole ,                            dysphagia intermittent. Previously had been ruled                            out for EoE. Medicines:                Monitored Anesthesia Care Procedure:                Pre-Anesthesia Assessment:                           - Prior to the procedure, a History and Physical                            was performed, and patient medications and                            allergies were reviewed. The patient's tolerance of                            previous anesthesia was also reviewed. The risks                            and benefits of the procedure and the sedation                            options and risks were discussed with the patient.                            All questions were answered, and informed consent                            was obtained. Prior Anticoagulants: The patient has                            taken no anticoagulant or antiplatelet agents. ASA                            Grade Assessment: II - A patient with mild systemic                            disease. After reviewing the risks and benefits,  the patient was deemed in satisfactory condition to                            undergo the procedure.                           After obtaining informed consent, the endoscope was                            passed under direct vision. Throughout the                            procedure, the patient's blood pressure, pulse, and                            oxygen saturations were monitored  continuously. The                            Olympus Scope [id number] was introduced through the                            mouth, and advanced to the second part of duodenum.                            The upper GI endoscopy was accomplished without                            difficulty. The patient tolerated the procedure                            well. Scope In: Scope Out: Findings:                 Esophagogastric landmarks were identified: the                            Z-line was found at 43 cm, the gastroesophageal                            junction was found at 43 cm and the upper extent of                            the gastric folds was found at 44 cm from the                            incisors.                           A 1 cm hiatal hernia was present.                           The exam of the esophagus was otherwise normal. No                            focal stenosis / stricture  or inflammatory changes.                           A guidewire was placed and the scope was withdrawn.                            empiric dilation was performed in the entire                            esophagus with a Savary dilator with mild                            resistance at 17 mm. Relook endoscopy showed no                            dilation effect / mucosal disruption.                           A few small sessile polyps were found in the                            gastric body. He has known fundic gland polyps, not                            biopsied today, benign appearing.                           The exam of the stomach was otherwise normal.                           The examined duodenum was normal. Complications:            No immediate complications. Estimated blood loss:                            Minimal. Estimated Blood Loss:     Estimated blood loss was minimal. Impression:               - Esophagogastric landmarks identified.                           - 1 cm hiatal hernia.                            - Normal esophagus - no Barrett's, no erosive                            changes, no focal stenosis / stricture - empiric                            dilation performed to 17mm.                           - A few gastric fundic gland polyps.                           -  Normal stomach otherwise.                           - Normal examined duodenum. Recommendation:           - Patient has a contact number available for                            emergencies. The signs and symptoms of potential                            delayed complications were discussed with the                            patient. Return to normal activities tomorrow.                            Written discharge instructions were provided to the                            patient.                           - Resume previous diet.                           - Continue present medications.                           - Await course post dilation. If symptoms persist,                            would further evaluate for dysmotility. Elspeth P. Pricella Gaugh, MD 04/12/2024 1:40:28 PM This report has been signed electronically.

## 2024-04-13 ENCOUNTER — Telehealth: Payer: Self-pay

## 2024-04-13 NOTE — Telephone Encounter (Signed)
  Follow up Call-     04/12/2024   12:31 PM  Call back number  Post procedure Call Back phone  # [phone]  Permission to leave phone message Yes     Patient questions:  Do you have a fever, pain , or abdominal swelling? No. Pain Score  0 *  Have you tolerated food without any problems? Yes.    Have you been able to return to your normal activities? Yes.    Do you have any questions about your discharge instructions: Diet   No. Medications  No. Follow up visit  No.  Do you have questions or concerns about your Care? No.  Actions: * If pain score is 4 or above: No action needed, pain <4.

## 2024-04-15 DIAGNOSIS — G4733 Obstructive sleep apnea (adult) (pediatric): Secondary | ICD-10-CM | POA: Diagnosis not present

## 2024-05-01 ENCOUNTER — Other Ambulatory Visit: Payer: Self-pay | Admitting: Internal Medicine

## 2024-05-08 DIAGNOSIS — E782 Mixed hyperlipidemia: Secondary | ICD-10-CM | POA: Diagnosis not present

## 2024-05-09 DIAGNOSIS — G4733 Obstructive sleep apnea (adult) (pediatric): Secondary | ICD-10-CM | POA: Diagnosis not present

## 2024-05-09 LAB — NMR, LIPOPROFILE
Cholesterol, Total: 145 mg/dL (ref 100–199)
HDL Particle Number: 34.8 umol/L (ref >=30.5)
HDL-C: 39 mg/dL — ABNORMAL LOW (ref >39)
LDL Particle Number: 1140 nmol/L — ABNORMAL HIGH (ref <1000)
LDL Size: 19.9 nm — ABNORMAL LOW (ref >20.5)
LDL-C (NIH Calc): 70 mg/dL (ref 0–99)
LP-IR Score: 81 — ABNORMAL HIGH (ref <=45)
Small LDL Particle Number: 785 nmol/L — ABNORMAL HIGH (ref <=527)
Triglycerides: 218 mg/dL — ABNORMAL HIGH (ref 0–149)

## 2024-05-21 MED ORDER — OMEPRAZOLE 40 MG PO CPDR: 40.0000 mg | DELAYED_RELEASE_CAPSULE | Freq: Two times a day (BID) | ORAL | 2 refills | Status: DC

## 2024-05-21 NOTE — Telephone Encounter (Signed)
 Linda/Dottie, ok to send in RX for Omeprazole  40mg  po bid to be taken 30 minutes before breakfast and dinner # 180, 2 refills. Pls let patient know when done. THX.

## 2024-05-30 DIAGNOSIS — G4733 Obstructive sleep apnea (adult) (pediatric): Secondary | ICD-10-CM | POA: Diagnosis not present

## 2024-06-09 DIAGNOSIS — G4733 Obstructive sleep apnea (adult) (pediatric): Secondary | ICD-10-CM | POA: Diagnosis not present

## 2024-06-14 ENCOUNTER — Other Ambulatory Visit: Payer: Self-pay | Admitting: Internal Medicine

## 2024-07-09 DIAGNOSIS — G4733 Obstructive sleep apnea (adult) (pediatric): Secondary | ICD-10-CM | POA: Diagnosis not present

## 2024-08-05 ENCOUNTER — Other Ambulatory Visit: Payer: Self-pay | Admitting: Internal Medicine

## 2024-08-08 MED ORDER — OMEPRAZOLE 40 MG PO CPDR: 40.0000 mg | DELAYED_RELEASE_CAPSULE | Freq: Two times a day (BID) | ORAL | 1 refills | Status: AC

## 2024-08-28 ENCOUNTER — Encounter: Admitting: Internal Medicine

## 2024-09-09 ENCOUNTER — Other Ambulatory Visit: Payer: Self-pay | Admitting: Nurse Practitioner

## 2024-10-04 ENCOUNTER — Other Ambulatory Visit: Payer: Self-pay

## 2024-10-04 ENCOUNTER — Ambulatory Visit
Admission: RE | Admit: 2024-10-04 | Discharge: 2024-10-04 | Disposition: A | Discharge status: Home / Self Care | Source: Ambulatory Visit

## 2024-10-04 VITALS — BP 115/78 | HR 80 | Temp 98.1°F | Resp 18 | Ht 72.0 in | Wt 240.0 lb

## 2024-10-04 DIAGNOSIS — B349 Viral infection, unspecified: Secondary | ICD-10-CM

## 2024-10-04 DIAGNOSIS — J4541 Moderate persistent asthma with (acute) exacerbation: Secondary | ICD-10-CM | POA: Diagnosis not present

## 2024-10-04 MED ORDER — METHYLPREDNISOLONE 4 MG PO TBPK: ORAL_TABLET | ORAL | 0 refills | Status: AC

## 2024-10-04 MED ORDER — ALBUTEROL SULFATE HFA 108 (90 BASE) MCG/ACT IN AERS: 1.0000 | INHALATION_SPRAY | Freq: Four times a day (QID) | RESPIRATORY_TRACT | 0 refills | Status: AC | PRN

## 2024-10-04 NOTE — Discharge Instructions (Addendum)

## 2024-10-04 NOTE — ED Triage Notes (Addendum)
 Pt presents with c/o cough and nasal congestion x 1 week. No fevers. OTC Mucinex  + Tylenol  taken for sxs at home with minimal improvement. Does endorse sick contacts. Flu/Covid test taken on Sunday 12/27 and they were both negative. Currently rates overall pain a 3/10. Feels achy + sore from coughing.

## 2024-10-04 NOTE — ED Provider Notes (Signed)
 " [facility]    CSN: [id number] Arrival date & time: 10/04/24  1408      History   Chief Complaint Chief Complaint  Patient presents with   Cough    Cough with chest congestion since 12/26. - Entered by patient   Nasal Congestion    HPI Wayne Melton is a 54 y.o. male.   HPI  Pt is here today with concerns of nasal congestion, dry cough, postnasal drainage, rhinorrhea. He reports chest congestion as well He reports that his symptoms started on 12/25 and his daughter was diagnosed with the flu. He took a home flu test on 12/28 and it was negative  He reports a previous hx of asthma and uses his Symbicort  daily. He reports he does not have a rescue inhaler.  Interventions; Mucinex , Tylenol , cough medication     Past Medical History:  Diagnosis Date   Allergic rhinitis 07/05/2012   Allergy    seasonal   Asthma    Constipation    chronic   Diabetes mellitus without complication (HCC)    No meds   Fatty liver disease, nonalcoholic    Gastroparesis    GERD , HH, h/o PUD 07/05/2012   Hyperlipidemia    IBS (irritable bowel syndrome) 11/06/2012   Obesity    OSA (obstructive sleep apnea)    mild   Seasonal allergies    Sleep apnea    uses CPAP    Patient Active Problem List   Diagnosis Date Noted   Insomnia 11/07/2023   Sleep disturbance 11/05/2022   Bronchospasm 02/10/2022   Cerebral aneurysm 05/14/2020   Gastroparesis 12/18/2019   DOE (dyspnea on exertion) 08/23/2019   BMI 35.0-35.9,adult 09/15/2018   OSA (obstructive sleep apnea) 10/14/2015   Follow-up ---------------PCP NOTES 06/12/2015   Diabetes mellitus without complication (HCC) 03/28/2015   Fatigue 11/08/2014   Annual physical exam 11/06/2012   IBS (irritable bowel syndrome) 11/06/2012   GERD , HH, h/o PUD 07/05/2012   Allergic rhinitis 07/05/2012   Hyperlipemia 07/05/2012    Past Surgical History:  Procedure Laterality Date   COLONOSCOPY     UPPER GASTROINTESTINAL ENDOSCOPY          Home Medications    Prior to Admission medications  Medication Sig Start Date End Date Taking? Authorizing Provider  albuterol  (VENTOLIN  HFA) 108 (90 Base) MCG/ACT inhaler Inhale 1-2 puffs into the lungs every 6 (six) hours as needed for wheezing or shortness of breath. 10/04/24  Yes Rahcel Shutes E, PA-C  methylPREDNISolone  (MEDROL  DOSEPAK) 4 MG TBPK tablet Please take per instructions on package 10/04/24  Yes Tavionna Grout E, PA-C  acetaminophen  (TYLENOL ) 500 MG tablet Take 1,000 mg by mouth every 6 (six) hours as needed for moderate pain.    [provider]  atorvastatin  (LIPITOR) 80 MG tablet TAKE 1 TABLET AT BEDTIME 08/06/24   Webb, Padonda B, FNP  azelastine  (ASTELIN ) 0.1 % nasal spray Place 2 sprays into both nostrils 2 (two) times daily. 01/17/24   Paz, Jose E, MD  budesonide -formoterol  (SYMBICORT ) 160-4.5 MCG/ACT inhaler Inhale 2 puffs into the lungs 2 (two) times daily. 03/12/24   Paz, Jose E, MD  empagliflozin  (JARDIANCE ) 25 MG TABS tablet Take 1 tablet (25 mg total) by mouth daily before breakfast. 06/14/24   Paz, Jose E, MD  eszopiclone  (LUNESTA ) 1 MG TABS tablet Take 1 tablet (1 mg total) by mouth at bedtime as needed for sleep. Take immediately before bedtime 01/30/24   Cobb, Comer GAILS, NP  ezetimibe  (ZETIA ) 10 MG tablet Take 1 tablet (10 mg total) by mouth daily. 03/20/24 06/18/24  Mona Vinie BROCKS, MD  fenofibrate  (TRICOR ) 145 MG tablet Take 1 tablet (145 mg total) by mouth daily. 05/01/24   Amon Aloysius BRAVO, MD  fluticasone  (FLONASE ) 50 MCG/ACT nasal spray Place into the nose.    [provider]  metFORMIN  (GLUCOPHAGE -XR) 750 MG 24 hr tablet Take 2 tablets (1,500 mg total) by mouth daily with breakfast. 05/01/24   Amon Aloysius BRAVO, MD  metoCLOPramide  (REGLAN ) 5 MG tablet TAKE 1 TABLET BY MOUTH PRIOR TO DINNER 09/10/24   Kennedy-Smith, Colleen M, NP  Multiple Vitamin (MULTIVITAMIN) tablet Take 1 tablet by mouth daily.    [provider]  omeprazole  (PRILOSEC) 40 MG  capsule Take 1 capsule (40 mg total) by mouth in the morning and at bedtime. 30 minutes before meals 08/08/24   Kennedy-Smith, Colleen M, NP  polyethylene glycol (MIRALAX ) 17 g packet Take 17 g by mouth daily. 07/18/19   Armbruster, Elspeth SQUIBB, MD  Semaglutide  (RYBELSUS ) 7 MG TABS Take 1 tablet (7 mg total) by mouth daily. 06/14/24   Amon Aloysius BRAVO, MD    Family History Family History  Problem Relation Age of Onset   Pancreatitis Mother    Diabetes Mother    Hyperlipidemia Mother    Colon polyps Mother        late 30s   Mitral valve prolapse Mother    Epilepsy Father    Crohn's disease Brother    Colon cancer Maternal Uncle        uncle in his 29s   Colon cancer Maternal Grandfather    CAD Other        GF in his 12s   Irritable bowel syndrome Other    Colitis Other    Heart disease Other    Prostate cancer Neg Hx    Esophageal cancer Neg Hx    Rectal cancer Neg Hx    Stomach cancer Neg Hx     Social History Social History[1]   Allergies   Patient has no known allergies.   Review of Systems Review of Systems  Constitutional:  Negative for chills and fever.  HENT:  Positive for congestion, postnasal drip and rhinorrhea. Negative for ear pain and sore throat.   Respiratory:  Positive for cough. Negative for shortness of breath and wheezing.   Gastrointestinal:  Negative for diarrhea, nausea and vomiting.  Musculoskeletal:  Positive for myalgias.  Skin:  Negative for rash.     Physical Exam Triage Vital Signs ED Triage Vitals  Encounter Vitals Group     BP 10/04/24 1529 115/78     Girls Systolic BP Percentile --      Girls Diastolic BP Percentile --      Boys Systolic BP Percentile --      Boys Diastolic BP Percentile --      Pulse Rate 10/04/24 1529 80     Resp 10/04/24 1529 18     Temp 10/04/24 1529 98.1 F (36.7 C)     Temp Source 10/04/24 1529 Oral     SpO2 10/04/24 1529 96 %     Weight 10/04/24 1528 240 lb (108.9 kg)     Height 10/04/24 1528 6' (1.829 m)      Head Circumference --      Peak Flow --      Pain Score 10/04/24 1527 3     Pain Loc --      Pain Education --  Exclude from Growth Chart --    No data found.  Updated Vital Signs BP 115/78 (BP Location: Right Arm)   Pulse 80   Temp 98.1 F (36.7 C) (Oral)   Resp 18   Ht 6' (1.829 m)   Wt 240 lb (108.9 kg)   SpO2 96%   BMI 32.55 kg/m   Visual Acuity Right Eye Distance:   Left Eye Distance:   Bilateral Distance:    Right Eye Near:   Left Eye Near:    Bilateral Near:     Physical Exam Vitals reviewed.  Constitutional:      General: He is awake.     Appearance: Normal appearance. He is well-developed and well-groomed.  HENT:     Head: Normocephalic and atraumatic.     Right Ear: Hearing, tympanic membrane and ear canal normal.     Left Ear: Hearing, tympanic membrane and ear canal normal.     Mouth/Throat:     Lips: Pink.     Mouth: Mucous membranes are moist.     Pharynx: Oropharynx is clear. Uvula midline. No pharyngeal swelling, oropharyngeal exudate, posterior oropharyngeal erythema, uvula swelling or postnasal drip.     Tonsils: No tonsillar exudate or tonsillar abscesses.  Eyes:     General: Lids are normal. Gaze aligned appropriately.     Extraocular Movements: Extraocular movements intact.  Cardiovascular:     Rate and Rhythm: Normal rate and regular rhythm.     Heart sounds: Normal heart sounds.  Pulmonary:     Effort: Pulmonary effort is normal.     Breath sounds: No decreased air movement. Wheezing present. No decreased breath sounds, rhonchi or rales.  Musculoskeletal:     Cervical back: Normal range of motion and neck supple.  Lymphadenopathy:     Head:     Right side of head: No submental, submandibular or preauricular adenopathy.     Left side of head: No submental, submandibular or preauricular adenopathy.     Cervical:     Right cervical: No superficial cervical adenopathy.    Left cervical: No superficial cervical adenopathy.     Upper  Body:     Right upper body: No supraclavicular adenopathy.     Left upper body: No supraclavicular adenopathy.  Skin:    General: Skin is warm and dry.  Neurological:     General: No focal deficit present.     Mental Status: He is alert and oriented to person, place, and time.  Psychiatric:        Mood and Affect: Mood normal.        Behavior: Behavior normal. Behavior is cooperative.        Thought Content: Thought content normal.        Judgment: Judgment normal.      UC Treatments / Results  Labs (all labs ordered are listed, but only abnormal results are displayed) Labs Reviewed - No data to display  EKG   Radiology No results found.  Procedures Procedures (including critical care time)  Medications Ordered in UC Medications - No data to display  Initial Impression / Assessment and Plan / UC Course  I have reviewed the triage vital signs and the nursing notes.  Pertinent labs & imaging results that were available during my care of the patient were reviewed by me and considered in my medical decision making (see chart for details).      Final Clinical Impressions(s) / UC Diagnoses   Final diagnoses:  Moderate persistent  asthma with acute exacerbation  Viral illness    Patient is here with concerns nasal congestion, dry cough, postnasal drainage and rhinorrhea.  He states that his symptoms started on 09/27/2024 and his daughter was diagnosed with the flu.  He reports that he is tested at home and his testing has been negative.  He has been using his Symbicort  as directed but does not have a rescue inhaler.  Physical exam is notable for wheezing but vitals are largely reassuring.  At this time I am suspicious for potential asthma exacerbation.  Will send him home with an albuterol  rescue inhaler and Medrol  Dosepak since he has a previous history of diabetes.  Reviewed importance of reducing excess sugar and carbs especially while on the steroid to prevent  hyperglycemia.  Reviewed OTC medications for further symptomatic relief.  ED and return precautions reviewed and provided in AVS.  Follow-up as needed.    Discharge Instructions      Based on your described symptoms and the duration of symptoms it is likely that you have a viral upper respiratory infection (often called a cold)  Symptoms can last for 3-10 days with lingering cough and intermittent symptoms potentially  lasting several  weeks after that.  The goal of treatment at this time is to reduce your symptoms and discomfort   You can use the following medications and measures to help yourself feel better until your body fights this off: DayQuil/NyQuil, TheraFlu, Alka-Seltzer  (these medications typically have the same active ingredients in them so you can choose whichever one you prefer and take consistently during the day and night according to the manufactures instructions.) Flonase  A daily antihistamine such as Zyrtec, Claritin, Allegra per your preference.  Please choose 1 and take consistently. Increased fluids.  It is recommended that you take in at least 64 ounces of water per day when you are not sick so it is important to increase this when you are sick and your body may be running fever. Rest Cough drops Chloraseptic throat spray to help with sore throat Nasal saline spray or nasal flushes to help with congestion and runny nose  If your symptoms seem like they are getting worse over the next 5 to 7 days or not improving you can always follow-up here in urgent care or go to your primary care provider for further management. Go to the ER if you begin to have more serious symptoms such as shortness of breath, trouble breathing, loss of consciousness, swelling around the eyes, high fever, severe lasting headaches, vision changes or neck pain/stiffness.       ED Prescriptions     Medication Sig Dispense Auth. Provider   methylPREDNISolone  (MEDROL  DOSEPAK) 4 MG TBPK  tablet Please take per instructions on package 21 each Dayona Shaheen E, PA-C   albuterol  (VENTOLIN  HFA) 108 (90 Base) MCG/ACT inhaler Inhale 1-2 puffs into the lungs every 6 (six) hours as needed for wheezing or shortness of breath. 8 g Henrietta Cieslewicz E, PA-C      PDMP not reviewed this encounter.     [1]  Social History Tobacco Use   Smoking status: Former    Current packs/day: 0.50    Average packs/day: 0.5 packs/day for 19.0 years (9.5 ttl pk-yrs)    Types: Cigarettes    Start date: 2007   Smokeless tobacco: Never   Tobacco comments:    On-off quit, currently not smoking 08-2021  Vaping Use   Vaping status: Never Used  Substance Use Topics  Alcohol use: Yes    Comment: rare   Drug use: Never     Marylene Rocky BRAVO, PA-C 10/08/24 0850  "

## 2024-11-30 ENCOUNTER — Encounter: Admitting: Internal Medicine
